# Patient Record
Sex: Female | Born: 1955 | Race: Black or African American | Hispanic: No | Marital: Single | Smoking: Never smoker
Health system: Southern US, Community
[De-identification: ages and names within clinical notes are randomized; demographics above are authoritative.]

## PROBLEM LIST (undated history)

## (undated) DIAGNOSIS — K219 Gastro-esophageal reflux disease without esophagitis: Secondary | ICD-10-CM

## (undated) DIAGNOSIS — K8689 Other specified diseases of pancreas: Secondary | ICD-10-CM

## (undated) DIAGNOSIS — Z789 Other specified health status: Secondary | ICD-10-CM

## (undated) DIAGNOSIS — K769 Liver disease, unspecified: Secondary | ICD-10-CM

## (undated) DIAGNOSIS — K449 Diaphragmatic hernia without obstruction or gangrene: Secondary | ICD-10-CM

---

## 2004-04-22 ENCOUNTER — Other Ambulatory Visit: Admission: RE | Admit: 2004-04-22 | Discharge: 2004-04-22 | Payer: Self-pay | Admitting: Family Medicine

## 2004-04-26 ENCOUNTER — Encounter: Admission: RE | Admit: 2004-04-26 | Discharge: 2004-04-26 | Payer: Self-pay | Admitting: Family Medicine

## 2019-06-30 ENCOUNTER — Ambulatory Visit (HOSPITAL_COMMUNITY)
Admission: EM | Admit: 2019-06-30 | Discharge: 2019-06-30 | Disposition: A | Discharge status: Home / Self Care | Payer: BC Managed Care – PPO | Attending: Family Medicine | Admitting: Family Medicine

## 2019-06-30 ENCOUNTER — Other Ambulatory Visit: Payer: Self-pay

## 2019-06-30 ENCOUNTER — Encounter (HOSPITAL_COMMUNITY): Payer: Self-pay

## 2019-06-30 DIAGNOSIS — R1013 Epigastric pain: Secondary | ICD-10-CM | POA: Insufficient documentation

## 2019-06-30 LAB — COMPREHENSIVE METABOLIC PANEL
ALT: 24 U/L (ref 0–44)
AST: 21 U/L (ref 15–41)
Albumin: 3.7 g/dL (ref 3.5–5.0)
Alkaline Phosphatase: 82 U/L (ref 38–126)
Anion gap: 13 (ref 5–15)
BUN: 9 mg/dL (ref 8–23)
CO2: 27 mmol/L (ref 22–32)
Calcium: 9.7 mg/dL (ref 8.9–10.3)
Chloride: 97 mmol/L — ABNORMAL LOW (ref 98–111)
Creatinine, Ser: 0.92 mg/dL (ref 0.44–1.00)
GFR calc Af Amer: >60 mL/min (ref >60)
GFR calc non Af Amer: >60 mL/min (ref >60)
Glucose, Bld: 152 mg/dL — ABNORMAL HIGH (ref 70–99)
Potassium: 3.4 mmol/L — ABNORMAL LOW (ref 3.5–5.1)
Sodium: 137 mmol/L (ref 135–145)
Total Bilirubin: 0.8 mg/dL (ref 0.3–1.2)
Total Protein: 8.4 g/dL — ABNORMAL HIGH (ref 6.5–8.1)

## 2019-06-30 LAB — CBC
HCT: 44.1 % (ref 36.0–46.0)
Hemoglobin: 14 g/dL (ref 12.0–15.0)
MCH: 23.2 pg — ABNORMAL LOW (ref 26.0–34.0)
MCHC: 31.7 g/dL (ref 30.0–36.0)
MCV: 73 fL — ABNORMAL LOW (ref 80.0–100.0)
Platelets: 320 10*3/uL (ref 150–400)
RBC: 6.04 MIL/uL — ABNORMAL HIGH (ref 3.87–5.11)
RDW: 13.9 % (ref 11.5–15.5)
WBC: 5.6 10*3/uL (ref 4.0–10.5)
nRBC: 0 % (ref 0.0–0.2)

## 2019-06-30 LAB — LIPASE, BLOOD: Lipase: 87 U/L — ABNORMAL HIGH (ref 11–51)

## 2019-06-30 MED ORDER — ALUM & MAG HYDROXIDE-SIMETH 200-200-20 MG/5ML PO SUSP
ORAL | Status: AC
  Filled 2019-06-30: qty 30

## 2019-06-30 MED ORDER — LIDOCAINE VISCOUS HCL 2 % MT SOLN
15.0000 mL | Freq: Once | OROMUCOSAL | Status: AC
  Administered 2019-06-30: 15:00:00 15 mL via ORAL

## 2019-06-30 MED ORDER — LIDOCAINE VISCOUS HCL 2 % MT SOLN
OROMUCOSAL | Status: AC
  Filled 2019-06-30: qty 15

## 2019-06-30 MED ORDER — ALUM & MAG HYDROXIDE-SIMETH 200-200-20 MG/5ML PO SUSP
30.0000 mL | Freq: Once | ORAL | Status: AC
  Administered 2019-06-30: 30 mL via ORAL

## 2019-06-30 NOTE — ED Triage Notes (Signed)
Pt. States she has a burning pain in her back, stomach for about 3 weeks now.

## 2019-06-30 NOTE — ED Provider Notes (Signed)
[employer]    CSN: [id number] Arrival date & time: 06/30/19  1117      History   Chief Complaint Chief Complaint  Patient presents with  . Gastroesophageal Reflux    HPI Rachel Vasquez is a 63 y.o. female.   Patient is an otherwise healthy 63 year old female presents today with approximately 3 weeks of intermittent epigastric discomfort with pain radiation into her upper back area.  Describes it as burning.  She took omeprazole for her symptoms for about 2 weeks without any relief.  She feels as if her symptoms are worsening.  She is also has some associated left-sided chest pain with radiation down the left arm.  Describes it is sharp at times.  Denies any associated shortness of breath.  She does not smoke.  She has not had any alcohol use in months.    ROS per HPI    Gastroesophageal Reflux    History reviewed. No pertinent past medical history.  There are no problems to display for this patient.   History reviewed. No pertinent surgical history.  OB History   No obstetric history on file.      Home Medications    Prior to Admission medications   Not on File    Family History History reviewed. No pertinent family history.  Social History Social History   Tobacco Use  . Smoking status: Never Smoker  . Smokeless tobacco: Never Used  Substance Use Topics  . Alcohol use: Yes  . Drug use: Never     Allergies   Patient has no known allergies.   Review of Systems Review of Systems   Physical Exam Triage Vital Signs ED Triage Vitals  Enc Vitals Group     BP 06/30/19 1315 (!) 137/97     Pulse Rate 06/30/19 1315 99     Resp 06/30/19 1315 19     Temp 06/30/19 1315 98.1 F (36.7 C)     Temp Source 06/30/19 1315 Oral     SpO2 06/30/19 1315 98 %     Weight 06/30/19 1311 248 lb (112.5 kg)     Height --      Head Circumference --      Peak Flow --      Pain Score 06/30/19 1311 5     Pain Loc --      Pain Edu? --      Excl. in [place]?  --    No data found.  Updated Vital Signs BP (!) 137/97 (BP Location: Right Arm)   Pulse 99   Temp 98.1 F (36.7 C) (Oral)   Resp 19   Wt 248 lb (112.5 kg)   SpO2 98%   Visual Acuity Right Eye Distance:   Left Eye Distance:   Bilateral Distance:    Right Eye Near:   Left Eye Near:    Bilateral Near:     Physical Exam Vitals and nursing note reviewed.  Constitutional:      General: She is not in acute distress.    Appearance: Normal appearance. She is ill-appearing. She is not toxic-appearing or diaphoretic.  HENT:     Head: Normocephalic and atraumatic.     Nose: Nose normal.  Pulmonary:     Effort: Pulmonary effort is normal.  Abdominal:     General: Bowel sounds are normal.     Palpations: Abdomen is soft.     Tenderness: There is abdominal tenderness in the epigastric area.  Musculoskeletal:  General: Normal range of motion.  Skin:    General: Skin is warm and dry.  Neurological:     Mental Status: She is alert.  Psychiatric:        Mood and Affect: Mood normal.      UC Treatments / Results  Labs (all labs ordered are listed, but only abnormal results are displayed) Labs Reviewed  CBC - Abnormal; Notable for the following components:      Result Value   RBC 6.04 (*)    MCV 73.0 (*)    MCH 23.2 (*)    All other components within normal limits  COMPREHENSIVE METABOLIC PANEL - Abnormal; Notable for the following components:   Potassium 3.4 (*)    Chloride 97 (*)    Glucose, Bld 152 (*)    Total Protein 8.4 (*)    All other components within normal limits  LIPASE, BLOOD - Abnormal; Notable for the following components:   Lipase 87 (*)    All other components within normal limits    EKG   Radiology No results found.  Procedures Procedures (including critical care time)  Medications Ordered in UC Medications  alum & mag hydroxide-simeth (MAALOX/MYLANTA) 200-200-20 MG/5ML suspension 30 mL (30 mLs Oral Given 06/30/19 1451)    And    lidocaine (XYLOCAINE) 2 % viscous mouth solution 15 mL (15 mLs Oral Given 06/30/19 1452)    Initial Impression / Assessment and Plan / UC Course  I have reviewed the triage vital signs and the nursing notes.  Pertinent labs & imaging results that were available during my care of the patient were reviewed by me and considered in my medical decision making (see chart for details).  Clinical Course as of Jul 01 1639  Mon Jun 30, 2019  1527 Lipase(!): 87 [TB]    Clinical Course User Index [TB] Orvan July, NP    63 year old female here with epigastric tenderness, nausea.  Symptoms been constant, waxing waning over the past 3 weeks. Patient reporting mild relief from GI cocktail but still having discomfort. EKG with mild tachycardia.  Upon reassessment patient's heart rate normalized.  The elevation was after receiving the GI cocktail. She is having no chest pain or shortness of breath. No concerning signs for ACS today. Based on symptoms I believe this may be some sort of gallbladder or pancreas issue.  Could be ulcer but feel this would resolve with the 2 weeks of Prilosec that she took.  She does not drink regularly but lipase was elevated at 87.  Not likely pancreatitis. Referral put in for GI for follow-up Recommended bland diet for now and she could keep trying Prilosec to see if this helps. If symptoms worsen she will need to go to the ER.  Patient understanding and agree. Final Clinical Impressions(s) / UC Diagnoses   Final diagnoses:  Abdominal pain, epigastric     Discharge Instructions     I am giving you a referral to GI.  They should be calling you to set up an appointment. Continue a bland diet for now. You can continue with the Prilosec to see if this helps. This could be some sort of gallbladder issue. If your symptoms worsen before you are able to get into see the specialist or you start having chest pain you will need to go to the ER. Contact put on your  instructions for primary care.  Please call them  for follow-up    ED Prescriptions    None  PDMP not reviewed this encounter.   Loura Halt A, NP 07/01/19 1640

## 2019-06-30 NOTE — Discharge Instructions (Addendum)
I am giving you a referral to GI.  They should be calling you to set up an appointment. Continue a bland diet for now. You can continue with the Prilosec to see if this helps. This could be some sort of gallbladder issue. If your symptoms worsen before you are able to get into see the specialist or you start having chest pain you will need to go to the ER. Contact put on your instructions for primary care.  Please call them  for follow-up

## 2019-07-02 ENCOUNTER — Inpatient Hospital Stay (HOSPITAL_COMMUNITY)
Admission: EM | Admit: 2019-07-02 | Discharge: 2019-07-17 | DRG: 423 | Disposition: A | Discharge status: Home / Self Care | Payer: BC Managed Care – PPO | Attending: Internal Medicine | Admitting: Internal Medicine

## 2019-07-02 ENCOUNTER — Encounter (HOSPITAL_COMMUNITY): Payer: Self-pay | Admitting: Emergency Medicine

## 2019-07-02 ENCOUNTER — Emergency Department (HOSPITAL_COMMUNITY): Payer: BC Managed Care – PPO

## 2019-07-02 ENCOUNTER — Other Ambulatory Visit: Payer: Self-pay

## 2019-07-02 DIAGNOSIS — E876 Hypokalemia: Secondary | ICD-10-CM

## 2019-07-02 DIAGNOSIS — R112 Nausea with vomiting, unspecified: Secondary | ICD-10-CM | POA: Diagnosis not present

## 2019-07-02 DIAGNOSIS — C250 Malignant neoplasm of head of pancreas: Secondary | ICD-10-CM | POA: Diagnosis not present

## 2019-07-02 DIAGNOSIS — K859 Acute pancreatitis without necrosis or infection, unspecified: Secondary | ICD-10-CM

## 2019-07-02 DIAGNOSIS — E860 Dehydration: Secondary | ICD-10-CM | POA: Diagnosis present

## 2019-07-02 DIAGNOSIS — E669 Obesity, unspecified: Secondary | ICD-10-CM | POA: Diagnosis present

## 2019-07-02 DIAGNOSIS — I10 Essential (primary) hypertension: Secondary | ICD-10-CM | POA: Diagnosis present

## 2019-07-02 DIAGNOSIS — R824 Acetonuria: Secondary | ICD-10-CM | POA: Diagnosis present

## 2019-07-02 DIAGNOSIS — D630 Anemia in neoplastic disease: Secondary | ICD-10-CM | POA: Diagnosis present

## 2019-07-02 DIAGNOSIS — K5900 Constipation, unspecified: Secondary | ICD-10-CM | POA: Diagnosis not present

## 2019-07-02 DIAGNOSIS — Z801 Family history of malignant neoplasm of trachea, bronchus and lung: Secondary | ICD-10-CM

## 2019-07-02 DIAGNOSIS — K8689 Other specified diseases of pancreas: Secondary | ICD-10-CM | POA: Diagnosis present

## 2019-07-02 DIAGNOSIS — R109 Unspecified abdominal pain: Secondary | ICD-10-CM

## 2019-07-02 DIAGNOSIS — Z20822 Contact with and (suspected) exposure to covid-19: Secondary | ICD-10-CM | POA: Diagnosis present

## 2019-07-02 DIAGNOSIS — K219 Gastro-esophageal reflux disease without esophagitis: Secondary | ICD-10-CM | POA: Diagnosis present

## 2019-07-02 DIAGNOSIS — K449 Diaphragmatic hernia without obstruction or gangrene: Secondary | ICD-10-CM

## 2019-07-02 DIAGNOSIS — D509 Iron deficiency anemia, unspecified: Secondary | ICD-10-CM

## 2019-07-02 DIAGNOSIS — D259 Leiomyoma of uterus, unspecified: Secondary | ICD-10-CM | POA: Diagnosis present

## 2019-07-02 DIAGNOSIS — C259 Malignant neoplasm of pancreas, unspecified: Secondary | ICD-10-CM

## 2019-07-02 DIAGNOSIS — K8681 Exocrine pancreatic insufficiency: Secondary | ICD-10-CM | POA: Diagnosis present

## 2019-07-02 DIAGNOSIS — K567 Ileus, unspecified: Secondary | ICD-10-CM

## 2019-07-02 DIAGNOSIS — R1013 Epigastric pain: Secondary | ICD-10-CM | POA: Diagnosis present

## 2019-07-02 DIAGNOSIS — I472 Ventricular tachycardia: Secondary | ICD-10-CM | POA: Diagnosis not present

## 2019-07-02 DIAGNOSIS — Z6841 Body Mass Index (BMI) 40.0 and over, adult: Secondary | ICD-10-CM

## 2019-07-02 DIAGNOSIS — Z800 Family history of malignant neoplasm of digestive organs: Secondary | ICD-10-CM

## 2019-07-02 DIAGNOSIS — K769 Liver disease, unspecified: Secondary | ICD-10-CM | POA: Diagnosis present

## 2019-07-02 DIAGNOSIS — R160 Hepatomegaly, not elsewhere classified: Secondary | ICD-10-CM

## 2019-07-02 DIAGNOSIS — C787 Secondary malignant neoplasm of liver and intrahepatic bile duct: Secondary | ICD-10-CM | POA: Diagnosis present

## 2019-07-02 DIAGNOSIS — C258 Malignant neoplasm of overlapping sites of pancreas: Secondary | ICD-10-CM

## 2019-07-02 DIAGNOSIS — R627 Adult failure to thrive: Secondary | ICD-10-CM | POA: Diagnosis present

## 2019-07-02 HISTORY — DX: Diaphragmatic hernia without obstruction or gangrene: K44.9

## 2019-07-02 HISTORY — DX: Other specified diseases of pancreas: K86.89

## 2019-07-02 HISTORY — DX: Liver disease, unspecified: K76.9

## 2019-07-02 HISTORY — DX: Other specified health status: Z78.9

## 2019-07-02 HISTORY — DX: Gastro-esophageal reflux disease without esophagitis: K21.9

## 2019-07-02 LAB — CBC
HCT: 42.5 % (ref 36.0–46.0)
Hemoglobin: 13.1 g/dL (ref 12.0–15.0)
MCH: 22.9 pg — ABNORMAL LOW (ref 26.0–34.0)
MCHC: 30.8 g/dL (ref 30.0–36.0)
MCV: 74.3 fL — ABNORMAL LOW (ref 80.0–100.0)
Platelets: 297 10*3/uL (ref 150–400)
RBC: 5.72 MIL/uL — ABNORMAL HIGH (ref 3.87–5.11)
RDW: 14.1 % (ref 11.5–15.5)
WBC: 5.6 10*3/uL (ref 4.0–10.5)
nRBC: 0 % (ref 0.0–0.2)

## 2019-07-02 LAB — LIPASE, BLOOD: Lipase: 92 U/L — ABNORMAL HIGH (ref 11–51)

## 2019-07-02 LAB — URINALYSIS, ROUTINE W REFLEX MICROSCOPIC
Glucose, UA: NEGATIVE mg/dL
Ketones, ur: 20 mg/dL — AB
Leukocytes,Ua: NEGATIVE
Nitrite: NEGATIVE
Protein, ur: 100 mg/dL — AB
Specific Gravity, Urine: 1.031 — ABNORMAL HIGH (ref 1.005–1.030)
pH: 5 (ref 5.0–8.0)

## 2019-07-02 LAB — COMPREHENSIVE METABOLIC PANEL
ALT: 24 U/L (ref 0–44)
AST: 21 U/L (ref 15–41)
Albumin: 3.5 g/dL (ref 3.5–5.0)
Alkaline Phosphatase: 85 U/L (ref 38–126)
Anion gap: 14 (ref 5–15)
BUN: 12 mg/dL (ref 8–23)
CO2: 27 mmol/L (ref 22–32)
Calcium: 9.4 mg/dL (ref 8.9–10.3)
Chloride: 97 mmol/L — ABNORMAL LOW (ref 98–111)
Creatinine, Ser: 0.9 mg/dL (ref 0.44–1.00)
GFR calc Af Amer: >60 mL/min (ref >60)
GFR calc non Af Amer: >60 mL/min (ref >60)
Glucose, Bld: 138 mg/dL — ABNORMAL HIGH (ref 70–99)
Potassium: 3.5 mmol/L (ref 3.5–5.1)
Sodium: 138 mmol/L (ref 135–145)
Total Bilirubin: 0.7 mg/dL (ref 0.3–1.2)
Total Protein: 7.8 g/dL (ref 6.5–8.1)

## 2019-07-02 LAB — TROPONIN I (HIGH SENSITIVITY): Troponin I (High Sensitivity): 6 ng/L (ref <18)

## 2019-07-02 MED ORDER — IOHEXOL 300 MG/ML  SOLN
100.0000 mL | Freq: Once | INTRAMUSCULAR | Status: AC | PRN
  Administered 2019-07-02: 100 mL via INTRAVENOUS

## 2019-07-02 MED ORDER — ONDANSETRON HCL 4 MG/2ML IJ SOLN
4.0000 mg | Freq: Once | INTRAMUSCULAR | Status: AC
  Administered 2019-07-02: 4 mg via INTRAVENOUS
  Filled 2019-07-02: qty 2

## 2019-07-02 MED ORDER — SODIUM CHLORIDE 0.9 % IV BOLUS
1000.0000 mL | Freq: Once | INTRAVENOUS | Status: AC
  Administered 2019-07-02: 1000 mL via INTRAVENOUS

## 2019-07-02 MED ORDER — ONDANSETRON 4 MG PO TBDP
4.0000 mg | ORAL_TABLET | Freq: Once | ORAL | Status: AC | PRN
  Administered 2019-07-02: 15:00:00 4 mg via ORAL
  Filled 2019-07-02: qty 1

## 2019-07-02 MED ORDER — MORPHINE SULFATE (PF) 4 MG/ML IV SOLN
4.0000 mg | Freq: Once | INTRAVENOUS | Status: DC
  Filled 2019-07-02: qty 1

## 2019-07-02 MED ORDER — MORPHINE SULFATE (PF) 2 MG/ML IV SOLN
2.0000 mg | Freq: Once | INTRAVENOUS | Status: AC
Start: 2019-07-02 — End: 2019-07-02
  Administered 2019-07-02: 2 mg via INTRAVENOUS
  Filled 2019-07-02: qty 1

## 2019-07-02 NOTE — ED Triage Notes (Signed)
Patient states she went to GI specialists in [place] today and was given prescriptions but patient reports unable to keep medicine down. GI recommends ultrasound and EGD but patient states she cannot wait 2 more weeks for them to schedule the procedure.

## 2019-07-02 NOTE — H&P (Signed)
History and Physical    Rachel Vasquez [id number] DOB: 1955-08-01 DOA: 07/02/2019  PCP: Patient, [name] Per   Patient coming from: Home   Chief Complaint: Nausea, vomiting, loss of appetite, fatigue, epigastric discomfort  HPI: Rachel Vasquez is a 63 y.o. female who denies any significant past medical history now presents for evaluation of progressive nausea, vomiting, loss of appetite, fatigue, and epigastric discomfort.  The patient reports that the symptoms developed insidiously little over 3 months ago, have now progressed to the point where she has now been able to tolerate much food or drink at all due to nausea and vomiting, and has developed some epigastric pain as well.  There has not been any fevers, chills, or cough associated with this.  She was evaluated at an urgent care recently, has been taking omeprazole without any relief, and was then seen by gastroenterology today who provided her with Zofran and planned for upcoming abdominal ultrasound and EGD.  Patient has been unable to tolerate the oral Zofran due to ongoing nausea and vomiting, does not feel that she can control her symptoms at home, and comes in for evaluation.  ED Course: Upon arrival to the ED, patient is found to be afebrile, saturating well on room air, and with stable blood pressure.  EKG features a sinus rhythm.  Chemistry panel and CBC are largely unremarkable, troponin is normal, and lipase elevated to 92.  Urinalysis notable for ketonuria and elevated specific gravity.  CT the abdomen and pelvis is concerning for generalized pancreatic inflammation with ill-defined hypodense suspicious mass at the head of the pancreas and multiple ill-defined hypodense liver lesions.  Patient was given a liter of normal saline, morphine, and Zofran in the emergency department.  COVID-19 screening test is in process.  Gastroenterology was contacted by the ED physician and indicated that they would be available for consultation should  the patient require admission for symptom control.  Review of Systems:  All other systems reviewed and apart from HPI, are negative.  Past Medical History:  Diagnosis Date  . Medical history non-contributory     History reviewed. No pertinent surgical history.   reports that she has never smoked. She has never used smokeless tobacco. She reports current alcohol use. She reports that she does not use drugs.  No Known Allergies  Family History  Problem Relation Age of Onset  . Pancreatic cancer Father      Prior to Admission medications   Not on File    Physical Exam: Vitals:   07/02/19 1437 07/02/19 1835 07/02/19 1945 07/02/19 2240  BP:  (!) 129/99 (!) 149/77 (!) 145/87  Pulse:  95 93 90  Resp:  18 18 17   Temp:      TempSrc:      SpO2:  98% 98% 98%  Weight: 111.6 kg     Height: 5\' 7"  (1.702 m)       Constitutional: NAD, calm  Eyes: PERTLA, lids and conjunctivae normal ENMT: Mucous membranes are moist. Posterior pharynx clear of any exudate or lesions.   Neck: normal, supple, no masses, no thyromegaly Respiratory: no wheezing, no crackles. Normal respiratory effort. No accessory muscle use.  Cardiovascular: S1 & S2 heard, regular rate and rhythm. No extremity edema.   Abdomen: No distension, soft, mild epigastric tenderness. Bowel sounds active.  Musculoskeletal: no clubbing / cyanosis. No joint deformity upper and lower extremities.    Skin: no significant rashes, lesions, ulcers. Warm, dry, well-perfused. Neurologic: No facial asymmetry. Sensation intact. Moving  all extremities.  Psychiatric: Alert, answering questions appropriately. Calm, cooperative.     Labs on Admission: I have personally reviewed following labs and imaging studies  CBC: Recent Labs  Lab 06/30/19 1432 07/02/19 1448  WBC 5.6 5.6  HGB 14.0 13.1  HCT 44.1 42.5  MCV 73.0* 74.3*  PLT 320 [id number]   Basic Metabolic Panel: Recent Labs  Lab 06/30/19 1432 07/02/19 1448  NA 137 138  K 3.4*  3.5  CL 97* 97*  CO2 27 27  GLUCOSE 152* 138*  BUN 9 12  CREATININE 0.92 0.90  CALCIUM 9.7 9.4   GFR: Estimated Creatinine Clearance: 81.3 mL/min (by C-G formula based on SCr of 0.9 mg/dL). Liver Function Tests: Recent Labs  Lab 06/30/19 1432 07/02/19 1448  AST 21 21  ALT 24 24  ALKPHOS 82 85  BILITOT 0.8 0.7  PROT 8.4* 7.8  ALBUMIN 3.7 3.5   Recent Labs  Lab 06/30/19 1432 07/02/19 1448  LIPASE 87* 92*   No results for input(s): AMMONIA in the last 168 hours. Coagulation Profile: No results for input(s): INR, PROTIME in the last 168 hours. Cardiac Enzymes: No results for input(s): CKTOTAL, CKMB, CKMBINDEX, TROPONINI in the last 168 hours. BNP (last 3 results) No results for input(s): PROBNP in the last 8760 hours. HbA1C: No results for input(s): HGBA1C in the last 72 hours. CBG: No results for input(s): GLUCAP in the last 168 hours. Lipid Profile: No results for input(s): CHOL, HDL, LDLCALC, TRIG, CHOLHDL, LDLDIRECT in the last 72 hours. Thyroid Function Tests: No results for input(s): TSH, T4TOTAL, FREET4, T3FREE, THYROIDAB in the last 72 hours. Anemia Panel: No results for input(s): VITAMINB12, FOLATE, FERRITIN, TIBC, IRON, RETICCTPCT in the last 72 hours. Urine analysis:    Component Value Date/Time   COLORURINE AMBER (A) 07/02/2019 2038   APPEARANCEUR CLOUDY (A) 07/02/2019 2038   LABSPEC 1.031 (H) 07/02/2019 2038   PHURINE 5.0 07/02/2019 2038   GLUCOSEU NEGATIVE 07/02/2019 2038   HGBUR SMALL (A) 07/02/2019 2038   BILIRUBINUR SMALL (A) 07/02/2019 2038   KETONESUR 20 (A) 07/02/2019 2038   PROTEINUR 100 (A) 07/02/2019 2038   NITRITE NEGATIVE 07/02/2019 2038   LEUKOCYTESUR NEGATIVE 07/02/2019 2038   Sepsis Labs: @LABRCNTIP (procalcitonin:4,lacticidven:4) )No results found for this or any previous visit (from the past 240 hour(s)).   Radiological Exams on Admission: CT Abdomen Pelvis W Contrast  Result Date: 07/02/2019 CLINICAL DATA:  Abdominal pain  with nausea and vomiting EXAM: CT ABDOMEN AND PELVIS WITH CONTRAST TECHNIQUE: Multidetector CT imaging of the abdomen and pelvis was performed using the standard protocol following bolus administration of intravenous contrast. CONTRAST:  153mL OMNIPAQUE IOHEXOL 300 MG/ML  SOLN COMPARISON:  CT 04/26/2004 FINDINGS: Lower chest: Lung bases demonstrate subsegmental atelectasis at the lingula, right middle lobe and inferior right upper lobe. No acute consolidation or pleural effusion. The heart size is within normal limits. Irregular moderate hiatal hernia with wall thickening. This is increased compared to prior. Hepatobiliary: Scattered hypodense liver lesions, some of which were present on comparison study. Multiple new ill-defined hypodense liver masses concerning for metastatic disease. Largest suspicious lesion is seen in the right hepatic dome and measures 2.5 cm. Multiple calcified gallstones. No biliary dilatation. Pancreas: Generalized inflammatory changes about the pancreas. Development of mild ductal dilatation. Ill-defined hypodense mass at the head of the pancreas measuring approximately 3.4 cm transverse by 2.2 cm AP by 3 cm craniocaudad. Spleen: Normal in size without focal abnormality. Adrenals/Urinary Tract: Adrenal glands are unremarkable. Kidneys are normal, without  renal calculi, focal lesion, or hydronephrosis. Bladder is unremarkable. Stomach/Bowel: No dilated small bowel. Negative for colon wall thickening. Negative appendix. Vascular/Lymphatic: Nonaneurysmal aorta. Hazy appearance of mesenteric root with subcentimeter nodes. Multiple small nodes or punctate nodules in the right lower quadrant/right gutter. Hypoenhancement of the distal splenic vein and superior mesenteric vein. Reproductive: Markedly enlarged uterus with multiple masses, increased compared to prior. Other: Negative for free air or significant ascites. Anterior abdominal wall laxity. Small supraumbilical fat containing ventral  hernia Musculoskeletal: No acute or suspicious osseous abnormality. IMPRESSION: 1. Generalized pancreatic inflammation. There is an ill-defined hypodense suspicious mass at the head of the pancreas measuring 3.4 x 2.2 x 3 cm. 2. Multiple ill-defined hypodense liver masses, consistent with metastatic disease. 3. Hypoenhancement of the splenic vein and superior mesenteric veins near their confluence, potentially occluded. 4. Multiple punctate nodules or small nodes in the right lower quadrant/gutter, cannot exclude metastatic disease 5. Moderate irregular hiatal hernia with some wall thickening, could be correlated with endoscopy 6. Bulky enlargement of the uterus with multiple large masses, increased compared to prior. Electronically Signed   By: Donavan Foil M.D.   On: 07/02/2019 22:17    EKG: Independently reviewed. Sinus rhythm.   Assessment/Plan   1. Pancreatic mass  - Presents with ~3 months of progressive anorexia, N/V, and now epigastric discomfort, and is found on CT to have an ill-defined mass at head of pancreas as well as hypodense liver lesions  - She has been unable to eat or drink much of anything recently due to N/V and appears dehydrated - With apparent liver lesions on CT, next step in diagnosis may be FNA  - Continue IVF hydration, antiemetics, pain-control     DVT prophylaxis: SCD's  Code Status: Full  Family Communication: Discussed with patient  Consults called: None Admission status: Observation     Vianne Bulls, MD [facility] Pager [phone]  If 7PM-7AM, please contact night-coverage [email or web address] Password [email or web address]  07/02/2019, 11:56 PM

## 2019-07-02 NOTE — ED Provider Notes (Signed)
[facility] EMERGENCY DEPARTMENT Provider Note   CSN: [id number] Arrival date & time: 07/02/19  1420     History Chief Complaint  Patient presents with  . Emesis    Rachel Vasquez is a 63 y.o. female.  The history is provided by the patient and medical records. No language interpreter was used.  Emesis  Rachel Vasquez is a 63 y.o. female who presents to the Emergency Department complaining of abdominal pain. She presents to the ED for evaluation of abdominal pain since September.  Overall sxs have progressively worsened.  She has generalized back pain, upper abdominal pain and bloating.  She feels weak and has poor appetite.  For last two weeks she has only had watermelon and pears.  She gets cold but has no fevers.  She is fatigued. Symptoms have gradually progressed over the last several months. She feels that she is in a point that she cannot handle her symptoms at home. She is taken medications for her symptoms but has ongoing pain and recurrent vomiting. She saw urgent care two days ago and had labs performed and was referred to G.I. She saw G.I. earlier today was recommended for ultrasound and EGD for further workup. She states she cannot wait two weeks for this additional workup.    History reviewed. No pertinent past medical history.  Patient Active Problem List   Diagnosis Date Noted  . 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 Nausea & vomiting 07/02/2019    History reviewed. No pertinent surgical history.   OB History   No obstetric history on file.     History reviewed. No pertinent family history.  Social History   Tobacco Use  . Smoking status: Never Smoker  . Smokeless tobacco: Never Used  Substance Use Topics  . Alcohol use: Yes  . Drug use: Never    Home Medications Prior to Admission medications   Not on File    Allergies    Patient has no known allergies.  Review of Systems   Review of Systems  Gastrointestinal: Positive for  vomiting.  All other systems reviewed and are negative.   Physical Exam Updated Vital Signs BP (!) 145/87   Pulse 90   Temp 98.2 F (36.8 C) (Oral)   Resp 17   Ht 5\' 7"  (1.702 m)   Wt 111.6 kg   SpO2 98%   BMI 38.53 kg/m   Physical Exam Vitals and nursing note reviewed.  Constitutional:      Appearance: She is well-developed.  HENT:     Head: Normocephalic and atraumatic.  Cardiovascular:     Rate and Rhythm: Normal rate and regular rhythm.  Pulmonary:     Effort: Pulmonary effort is normal. No respiratory distress.  Abdominal:     Palpations: Abdomen is soft.     Tenderness: There is no guarding or rebound.     Comments: Mild epigastric tenderness  Musculoskeletal:        General: No swelling or tenderness.  Skin:    General: Skin is warm and dry.  Neurological:     Mental Status: She is alert and oriented to person, place, and time.  Psychiatric:        Behavior: Behavior normal.     ED Results / Procedures / Treatments   Labs (all labs ordered are listed, but only abnormal results are displayed) Labs Reviewed  LIPASE, BLOOD - Abnormal; Notable for the following components:      Result Value   Lipase 92 (*)  All other components within normal limits  COMPREHENSIVE METABOLIC PANEL - Abnormal; Notable for the following components:   Chloride 97 (*)    Glucose, Bld 138 (*)    All other components within normal limits  CBC - Abnormal; Notable for the following components:   RBC 5.72 (*)    MCV 74.3 (*)    MCH 22.9 (*)    All other components within normal limits  URINALYSIS, ROUTINE W REFLEX MICROSCOPIC - Abnormal; Notable for the following components:   Color, Urine AMBER (*)    APPearance CLOUDY (*)    Specific Gravity, Urine 1.031 (*)    Hgb urine dipstick SMALL (*)    Bilirubin Urine SMALL (*)    Ketones, ur 20 (*)    Protein, ur 100 (*)    Bacteria, UA RARE (*)    All other components within normal limits  SARS CORONAVIRUS 2 (TAT 6-24 HRS)    TROPONIN I (HIGH SENSITIVITY)  TROPONIN I (HIGH SENSITIVITY)    EKG EKG Interpretation  Date/Time:  Wednesday July 02 2019 21:07:31 EST Ventricular Rate:  87 PR Interval:    QRS Duration: 97 QT Interval:  397 QTC Calculation: 478 R Axis:   32 Text Interpretation: Sinus rhythm Low voltage, precordial leads Borderline T abnormalities, inferior leads no prior available for comparison Confirmed by Quintella Reichert [phone]) on 07/02/2019 9:28:57 PM   Radiology CT Abdomen Pelvis W Contrast  Result Date: 07/02/2019 CLINICAL DATA:  Abdominal pain with nausea and vomiting EXAM: CT ABDOMEN AND PELVIS WITH CONTRAST TECHNIQUE: Multidetector CT imaging of the abdomen and pelvis was performed using the standard protocol following bolus administration of intravenous contrast. CONTRAST:  130mL OMNIPAQUE IOHEXOL 300 MG/ML  SOLN COMPARISON:  CT 04/26/2004 FINDINGS: Lower chest: Lung bases demonstrate subsegmental atelectasis at the lingula, right middle lobe and inferior right upper lobe. No acute consolidation or pleural effusion. The heart size is within normal limits. Irregular moderate hiatal hernia with wall thickening. This is increased compared to prior. Hepatobiliary: Scattered hypodense liver lesions, some of which were present on comparison study. Multiple new ill-defined hypodense liver masses concerning for metastatic disease. Largest suspicious lesion is seen in the right hepatic dome and measures 2.5 cm. Multiple calcified gallstones. No biliary dilatation. Pancreas: Generalized inflammatory changes about the pancreas. Development of mild ductal dilatation. Ill-defined hypodense mass at the head of the pancreas measuring approximately 3.4 cm transverse by 2.2 cm AP by 3 cm craniocaudad. Spleen: Normal in size without focal abnormality. Adrenals/Urinary Tract: Adrenal glands are unremarkable. Kidneys are normal, without renal calculi, focal lesion, or hydronephrosis. Bladder is unremarkable.  Stomach/Bowel: No dilated small bowel. Negative for colon wall thickening. Negative appendix. Vascular/Lymphatic: Nonaneurysmal aorta. Hazy appearance of mesenteric root with subcentimeter nodes. Multiple small nodes or punctate nodules in the right lower quadrant/right gutter. Hypoenhancement of the distal splenic vein and superior mesenteric vein. Reproductive: Markedly enlarged uterus with multiple masses, increased compared to prior. Other: Negative for free air or significant ascites. Anterior abdominal wall laxity. Small supraumbilical fat containing ventral hernia Musculoskeletal: No acute or suspicious osseous abnormality. IMPRESSION: 1. Generalized pancreatic inflammation. There is an ill-defined hypodense suspicious mass at the head of the pancreas measuring 3.4 x 2.2 x 3 cm. 2. Multiple ill-defined hypodense liver masses, consistent with metastatic disease. 3. Hypoenhancement of the splenic vein and superior mesenteric veins near their confluence, potentially occluded. 4. Multiple punctate nodules or small nodes in the right lower quadrant/gutter, cannot exclude metastatic disease 5. Moderate irregular hiatal hernia with  some wall thickening, could be correlated with endoscopy 6. Bulky enlargement of the uterus with multiple large masses, increased compared to prior. Electronically Signed   By: Donavan Foil M.D.   On: 07/02/2019 22:17    Procedures Procedures (including critical care time)  Medications Ordered in ED Medications  ondansetron (ZOFRAN-ODT) disintegrating tablet 4 mg (4 mg Oral Given 07/02/19 1441)  iohexol (OMNIPAQUE) 300 MG/ML solution 100 mL (100 mLs Intravenous Contrast Given 07/02/19 2135)  sodium chloride 0.9 % bolus 1,000 mL (1,000 mLs Intravenous New Bag/Given 07/02/19 2311)  morphine 2 MG/ML injection 2 mg (2 mg Intravenous Given 07/02/19 2332)  ondansetron (ZOFRAN) injection 4 mg (4 mg Intravenous Given 07/02/19 2332)    ED Course  I have reviewed the triage vital  signs and the nursing notes.  Pertinent labs & imaging results that were available during my care of the patient were reviewed by me and considered in my medical decision making (see chart for details).    MDM Rules/Calculators/A&P                     Patient here for evaluation of abdominal pain and vomiting that is progressed over the last several months. She is non-toxic appearing on evaluation. CT scan was obtained, which is consistent with pancreatic mass and possible metastatic disease. Discussed with patient findings of studies. Given her poorly controlled symptoms at home, hospitalist consulted for admission. Will treat with IV fluids, pain control pending further workup.  Final Clinical Impression(s) / ED Diagnoses Final diagnoses:  Pancreatic mass    Rx / DC Orders ED Discharge Orders    None       Quintella Reichert, MD 07/02/19 2349

## 2019-07-02 NOTE — ED Notes (Signed)
Pt stated she cannot provide urine sample right now.

## 2019-07-03 ENCOUNTER — Encounter (HOSPITAL_COMMUNITY): Payer: Self-pay | Admitting: Family Medicine

## 2019-07-03 DIAGNOSIS — Z20822 Contact with and (suspected) exposure to covid-19: Secondary | ICD-10-CM | POA: Diagnosis present

## 2019-07-03 DIAGNOSIS — C250 Malignant neoplasm of head of pancreas: Secondary | ICD-10-CM | POA: Diagnosis present

## 2019-07-03 DIAGNOSIS — K769 Liver disease, unspecified: Secondary | ICD-10-CM | POA: Diagnosis present

## 2019-07-03 DIAGNOSIS — R112 Nausea with vomiting, unspecified: Secondary | ICD-10-CM | POA: Diagnosis not present

## 2019-07-03 DIAGNOSIS — D630 Anemia in neoplastic disease: Secondary | ICD-10-CM | POA: Diagnosis present

## 2019-07-03 DIAGNOSIS — K219 Gastro-esophageal reflux disease without esophagitis: Secondary | ICD-10-CM | POA: Diagnosis present

## 2019-07-03 DIAGNOSIS — R160 Hepatomegaly, not elsewhere classified: Secondary | ICD-10-CM | POA: Diagnosis not present

## 2019-07-03 DIAGNOSIS — C787 Secondary malignant neoplasm of liver and intrahepatic bile duct: Secondary | ICD-10-CM | POA: Diagnosis present

## 2019-07-03 DIAGNOSIS — Z801 Family history of malignant neoplasm of trachea, bronchus and lung: Secondary | ICD-10-CM | POA: Diagnosis not present

## 2019-07-03 DIAGNOSIS — K859 Acute pancreatitis without necrosis or infection, unspecified: Secondary | ICD-10-CM | POA: Diagnosis present

## 2019-07-03 DIAGNOSIS — E876 Hypokalemia: Secondary | ICD-10-CM | POA: Diagnosis present

## 2019-07-03 DIAGNOSIS — C259 Malignant neoplasm of pancreas, unspecified: Secondary | ICD-10-CM | POA: Diagnosis not present

## 2019-07-03 DIAGNOSIS — R1084 Generalized abdominal pain: Secondary | ICD-10-CM | POA: Diagnosis not present

## 2019-07-03 DIAGNOSIS — I10 Essential (primary) hypertension: Secondary | ICD-10-CM | POA: Diagnosis present

## 2019-07-03 DIAGNOSIS — R1013 Epigastric pain: Secondary | ICD-10-CM | POA: Diagnosis not present

## 2019-07-03 DIAGNOSIS — D509 Iron deficiency anemia, unspecified: Secondary | ICD-10-CM | POA: Diagnosis present

## 2019-07-03 DIAGNOSIS — K8689 Other specified diseases of pancreas: Secondary | ICD-10-CM | POA: Diagnosis present

## 2019-07-03 DIAGNOSIS — E669 Obesity, unspecified: Secondary | ICD-10-CM | POA: Diagnosis present

## 2019-07-03 DIAGNOSIS — K8681 Exocrine pancreatic insufficiency: Secondary | ICD-10-CM | POA: Diagnosis present

## 2019-07-03 DIAGNOSIS — R627 Adult failure to thrive: Secondary | ICD-10-CM | POA: Diagnosis present

## 2019-07-03 DIAGNOSIS — D259 Leiomyoma of uterus, unspecified: Secondary | ICD-10-CM | POA: Diagnosis present

## 2019-07-03 DIAGNOSIS — K449 Diaphragmatic hernia without obstruction or gangrene: Secondary | ICD-10-CM

## 2019-07-03 DIAGNOSIS — I472 Ventricular tachycardia: Secondary | ICD-10-CM | POA: Diagnosis not present

## 2019-07-03 DIAGNOSIS — Z800 Family history of malignant neoplasm of digestive organs: Secondary | ICD-10-CM | POA: Diagnosis not present

## 2019-07-03 DIAGNOSIS — K5900 Constipation, unspecified: Secondary | ICD-10-CM | POA: Diagnosis not present

## 2019-07-03 DIAGNOSIS — R824 Acetonuria: Secondary | ICD-10-CM | POA: Diagnosis present

## 2019-07-03 DIAGNOSIS — Z6841 Body Mass Index (BMI) 40.0 and over, adult: Secondary | ICD-10-CM | POA: Diagnosis not present

## 2019-07-03 DIAGNOSIS — E860 Dehydration: Secondary | ICD-10-CM | POA: Diagnosis present

## 2019-07-03 LAB — CBC
HCT: 38.4 % (ref 36.0–46.0)
Hemoglobin: 11.9 g/dL — ABNORMAL LOW (ref 12.0–15.0)
MCH: 22.9 pg — ABNORMAL LOW (ref 26.0–34.0)
MCHC: 31 g/dL (ref 30.0–36.0)
MCV: 73.8 fL — ABNORMAL LOW (ref 80.0–100.0)
Platelets: 228 10*3/uL (ref 150–400)
RBC: 5.2 MIL/uL — ABNORMAL HIGH (ref 3.87–5.11)
RDW: 14.3 % (ref 11.5–15.5)
WBC: 5.5 10*3/uL (ref 4.0–10.5)
nRBC: 0 % (ref 0.0–0.2)

## 2019-07-03 LAB — COMPREHENSIVE METABOLIC PANEL
ALT: 24 U/L (ref 0–44)
AST: 22 U/L (ref 15–41)
Albumin: 3 g/dL — ABNORMAL LOW (ref 3.5–5.0)
Alkaline Phosphatase: 73 U/L (ref 38–126)
Anion gap: 11 (ref 5–15)
BUN: 12 mg/dL (ref 8–23)
CO2: 27 mmol/L (ref 22–32)
Calcium: 8.9 mg/dL (ref 8.9–10.3)
Chloride: 100 mmol/L (ref 98–111)
Creatinine, Ser: 0.87 mg/dL (ref 0.44–1.00)
GFR calc Af Amer: >60 mL/min (ref >60)
GFR calc non Af Amer: >60 mL/min (ref >60)
Glucose, Bld: 122 mg/dL — ABNORMAL HIGH (ref 70–99)
Potassium: 3.6 mmol/L (ref 3.5–5.1)
Sodium: 138 mmol/L (ref 135–145)
Total Bilirubin: 1 mg/dL (ref 0.3–1.2)
Total Protein: 6.9 g/dL (ref 6.5–8.1)

## 2019-07-03 LAB — TROPONIN I (HIGH SENSITIVITY): Troponin I (High Sensitivity): 8 ng/L (ref <18)

## 2019-07-03 LAB — HIV ANTIBODY (ROUTINE TESTING W REFLEX): HIV Screen 4th Generation wRfx: NONREACTIVE

## 2019-07-03 LAB — SARS CORONAVIRUS 2 (TAT 6-24 HRS): SARS Coronavirus 2: NEGATIVE

## 2019-07-03 LAB — HEMOGLOBIN A1C
Hgb A1c MFr Bld: 6.5 % — ABNORMAL HIGH (ref 4.8–5.6)
Mean Plasma Glucose: 139.85 mg/dL

## 2019-07-03 MED ORDER — PANTOPRAZOLE SODIUM 40 MG PO TBEC
40.0000 mg | DELAYED_RELEASE_TABLET | Freq: Every day | ORAL | Status: DC
  Filled 2019-07-03 (×4): qty 1

## 2019-07-03 MED ORDER — ACETAMINOPHEN 650 MG RE SUPP: 650.0000 mg | Freq: Four times a day (QID) | RECTAL | Status: DC | PRN

## 2019-07-03 MED ORDER — ACETAMINOPHEN 325 MG PO TABS: 650.0000 mg | ORAL_TABLET | Freq: Four times a day (QID) | ORAL | Status: DC | PRN

## 2019-07-03 MED ORDER — HYDROCODONE-ACETAMINOPHEN 5-325 MG PO TABS
1.0000 | ORAL_TABLET | ORAL | Status: DC | PRN
  Administered 2019-07-09 – 2019-07-11 (×10): 2 via ORAL
  Filled 2019-07-03 (×11): qty 2

## 2019-07-03 MED ORDER — BOOST / RESOURCE BREEZE PO LIQD CUSTOM
1.0000 | Freq: Three times a day (TID) | ORAL | Status: DC
  Administered 2019-07-03: 1 via ORAL

## 2019-07-03 MED ORDER — SODIUM CHLORIDE 0.9 % IV SOLN: INTRAVENOUS | Status: DC

## 2019-07-03 MED ORDER — ONDANSETRON HCL 4 MG/2ML IJ SOLN
4.0000 mg | Freq: Four times a day (QID) | INTRAMUSCULAR | Status: DC | PRN
  Administered 2019-07-03 – 2019-07-16 (×17): 4 mg via INTRAVENOUS
  Filled 2019-07-03 (×21): qty 2

## 2019-07-03 MED ORDER — ONDANSETRON HCL 4 MG PO TABS
4.0000 mg | ORAL_TABLET | Freq: Four times a day (QID) | ORAL | Status: DC | PRN
  Filled 2019-07-03: qty 1

## 2019-07-03 MED ORDER — MORPHINE SULFATE (PF) 4 MG/ML IV SOLN
4.0000 mg | INTRAVENOUS | Status: DC | PRN
  Administered 2019-07-03 – 2019-07-05 (×11): 4 mg via INTRAVENOUS
  Filled 2019-07-03 (×11): qty 1

## 2019-07-03 MED ORDER — POLYETHYLENE GLYCOL 3350 17 G PO PACK
17.0000 g | PACK | Freq: Every day | ORAL | Status: DC | PRN
  Administered 2019-07-07 – 2019-07-08 (×2): 17 g via ORAL
  Filled 2019-07-03 (×2): qty 1

## 2019-07-03 MED ORDER — ZOLPIDEM TARTRATE 5 MG PO TABS
5.0000 mg | ORAL_TABLET | Freq: Every evening | ORAL | Status: DC | PRN
  Administered 2019-07-09: 5 mg via ORAL
  Filled 2019-07-03: qty 1

## 2019-07-03 MED ORDER — SIMETHICONE 40 MG/0.6ML PO SUSP
40.0000 mg | Freq: Four times a day (QID) | ORAL | Status: DC | PRN
  Administered 2019-07-03 – 2019-07-12 (×25): 40 mg via ORAL
  Filled 2019-07-03 (×29): qty 0.6

## 2019-07-03 NOTE — Progress Notes (Signed)
[facility] PROGRESS NOTE  Rachel Vasquez [id number] DOB: Jan 22, 1956 DOA: 07/02/2019 PCP: Patient, [name] Per  Assessment/Plan:  #1.Pancreatic mass. 3 month hx of intermittent abdominal pain with n/v. CT reveals ill defined mass at head of pancreas. Pain only slightly improved. Tolerating clear liquid with zofran. Will need biopsy and patient insisting on having that done inpatient -GI consult -advance to full liquids -zofran -analgesia  #2. Liver lesion. Per CT.  -see #1  #3. Nausea/vomiting/gerd. Continues with nausea. No vomiting. Tolerating clear liquids. Related to above.  -continue zofran -clear liquid diet -gentle IV fluids -analgesia -PPI -await GI recs  #4. Hiatal hernia. stable  Code Status: full Family Communication: patient Disposition Plan: home when readyu   Consultants:  [facility]  Procedures:    Antibiotics:    HPI/Subjective:   Objective: Vitals:   07/03/19 0816 07/03/19 1230  BP: (!) 148/80 140/90  Pulse: 86 90  Resp:  16  Temp:  98 F (36.7 C)  SpO2:  98%    Intake/Output Summary (Last 24 hours) at 07/03/2019 1412 Last data filed at 07/03/2019 0400 Gross per 24 hour  Intake 1170.57 ml  Output --  Net 1170.57 ml   Filed Weights   07/02/19 1437 07/03/19 0500 07/03/19 0554  Weight: 111.6 kg 111.6 kg 116.1 kg    Exam:   General:  Awake alert talking on phone  Cardiovascular: rrr no MGR no LE edema  Respiratory: normal effort BS clear bilaterally no wheeze  Abdomen: obese soft +BS non-tender no guarding or rebounding  Musculoskeletal: joints without swelling/erythema   Data Reviewed: Basic Metabolic Panel: Recent Labs  Lab 06/30/19 1432 07/02/19 1448 07/03/19 0522  NA 137 138 138  K 3.4* 3.5 3.6  CL 97* 97* 100  CO2 27 27 27   GLUCOSE 152* 138* 122*  BUN 9 12 12   CREATININE 0.92 0.90 0.87  CALCIUM 9.7 9.4 8.9   Liver Function Tests: Recent Labs  Lab 06/30/19 1432 07/02/19 1448 07/03/19 0522   AST 21 21 22   ALT 24 24 24   ALKPHOS 82 85 73  BILITOT 0.8 0.7 1.0  PROT 8.4* 7.8 6.9  ALBUMIN 3.7 3.5 3.0*   Recent Labs  Lab 06/30/19 1432 07/02/19 1448  LIPASE 87* 92*   No results for input(s): AMMONIA in the last 168 hours. CBC: Recent Labs  Lab 06/30/19 1432 07/02/19 1448 07/03/19 0522  WBC 5.6 5.6 5.5  HGB 14.0 13.1 11.9*  HCT 44.1 42.5 38.4  MCV 73.0* 74.3* 73.8*  PLT 320 297 228   Cardiac Enzymes: No results for input(s): CKTOTAL, CKMB, CKMBINDEX, TROPONINI in the last 168 hours. BNP (last 3 results) No results for input(s): BNP in the last 8760 hours.  ProBNP (last 3 results) No results for input(s): PROBNP in the last 8760 hours.  CBG: No results for input(s): GLUCAP in the last 168 hours.  Recent Results (from the past 240 hour(s))  SARS CORONAVIRUS 2 (TAT 6-24 HRS) Nasopharyngeal Nasopharyngeal Swab     Status: None   Collection Time: 07/02/19 11:40 PM   Specimen: Nasopharyngeal Swab  Result Value Ref Range Status   SARS Coronavirus 2 NEGATIVE NEGATIVE Final    Comment: (NOTE) SARS-CoV-2 target nucleic acids are NOT DETECTED. The SARS-CoV-2 RNA is generally detectable in upper and lower respiratory specimens during the acute phase of infection. Negative results do not preclude SARS-CoV-2 infection, do not rule out co-infections with other pathogens, and should not be used as the sole basis for treatment or other  patient management decisions. Negative results must be combined with clinical observations, patient history, and epidemiological information. The expected result is Negative. Fact Sheet for Patients: SugarRoll.be Fact Sheet for Healthcare Providers: https://www.woods-mathews.com/ This test is not yet approved or cleared by the [place] FDA and  has been authorized for detection and/or diagnosis of SARS-CoV-2 by FDA under an Emergency Use Authorization (EUA). This EUA will remain  in effect  (meaning this test can be used) for the duration of the COVID-19 declaration under Section 56 4(b)(1) of the Act, 21 U.S.C. section 360bbb-3(b)(1), unless the authorization is terminated or revoked sooner. Performed at [facility], [place] [address] [place], [place] [address]      Studies: CT Abdomen Pelvis W Contrast  Result Date: 07/02/2019 CLINICAL DATA:  Abdominal pain with nausea and vomiting EXAM: CT ABDOMEN AND PELVIS WITH CONTRAST TECHNIQUE: Multidetector CT imaging of the abdomen and pelvis was performed using the standard protocol following bolus administration of intravenous contrast. CONTRAST:  129mL OMNIPAQUE IOHEXOL 300 MG/ML  SOLN COMPARISON:  CT 04/26/2004 FINDINGS: Lower chest: Lung bases demonstrate subsegmental atelectasis at the lingula, right middle lobe and inferior right upper lobe. No acute consolidation or pleural effusion. The heart size is within normal limits. Irregular moderate hiatal hernia with wall thickening. This is increased compared to prior. Hepatobiliary: Scattered hypodense liver lesions, some of which were present on comparison study. Multiple new ill-defined hypodense liver masses concerning for metastatic disease. Largest suspicious lesion is seen in the right hepatic dome and measures 2.5 cm. Multiple calcified gallstones. No biliary dilatation. Pancreas: Generalized inflammatory changes about the pancreas. Development of mild ductal dilatation. Ill-defined hypodense mass at the head of the pancreas measuring approximately 3.4 cm transverse by 2.2 cm AP by 3 cm craniocaudad. Spleen: Normal in size without focal abnormality. Adrenals/Urinary Tract: Adrenal glands are unremarkable. Kidneys are normal, without renal calculi, focal lesion, or hydronephrosis. Bladder is unremarkable. Stomach/Bowel: No dilated small bowel. Negative for colon wall thickening. Negative appendix. Vascular/Lymphatic: Nonaneurysmal aorta. Hazy appearance of mesenteric root with  subcentimeter nodes. Multiple small nodes or punctate nodules in the right lower quadrant/right gutter. Hypoenhancement of the distal splenic vein and superior mesenteric vein. Reproductive: Markedly enlarged uterus with multiple masses, increased compared to prior. Other: Negative for free air or significant ascites. Anterior abdominal wall laxity. Small supraumbilical fat containing ventral hernia Musculoskeletal: No acute or suspicious osseous abnormality. IMPRESSION: 1. Generalized pancreatic inflammation. There is an ill-defined hypodense suspicious mass at the head of the pancreas measuring 3.4 x 2.2 x 3 cm. 2. Multiple ill-defined hypodense liver masses, consistent with metastatic disease. 3. Hypoenhancement of the splenic vein and superior mesenteric veins near their confluence, potentially occluded. 4. Multiple punctate nodules or small nodes in the right lower quadrant/gutter, cannot exclude metastatic disease 5. Moderate irregular hiatal hernia with some wall thickening, could be correlated with endoscopy 6. Bulky enlargement of the uterus with multiple large masses, increased compared to prior. Electronically Signed   By: Donavan Foil M.D.   On: 07/02/2019 22:17    Scheduled Meds: . feeding supplement  1 Container Oral TID BM  . pantoprazole  40 mg Oral Daily   Continuous Infusions: . sodium chloride      Principal Problem:   Mass of pancreas Active Problems:   Nausea & vomiting   Liver lesion   Abdominal pain, epigastric   GERD (gastroesophageal reflux disease)   Hiatal hernia    Time spent: 33 minutes    Radene Gunning NP  [facility]  If 7PM-7AM, please contact night-coverage at [email or web address], password [facility] 07/03/2019, 2:12 PM  LOS: 0 days

## 2019-07-03 NOTE — Consult Note (Signed)
Subjective:   HPI  The patient is a 63 year old female who has been having difficulty with abdominal pain, nausea, and difficulty wanting to eat since September.  She thought it was all from reflux.  She finally came to the hospital where she had a CT scan which showed generalized pancreatic inflammation and there was an ill-defined hypodense suspicious mass at the head of the pancreas measuring 3. 4 x 2.2 x 3 cm.  There were multiple ill-defined hypodense liver masses also consistent with metastatic disease.  We were asked to consult in regards to this.  Review of Systems No chest pain or shortness of breath  Past Medical History:  Diagnosis Date  . GERD (gastroesophageal reflux disease)   . Hiatal hernia   . Liver lesion   . Medical history non-contributory   . Pancreatic mass    History reviewed. No pertinent surgical history. Social History   Socioeconomic History  . Marital status: Single    Spouse name: Not on file  . Number of children: Not on file  . Years of education: Not on file  . Highest education level: Not on file  Occupational History  . Not on file  Tobacco Use  . Smoking status: Never Smoker  . Smokeless tobacco: Never Used  Substance and Sexual Activity  . Alcohol use: Yes  . Drug use: Never  . Sexual activity: Yes  Other Topics Concern  . Not on file  Social History Narrative  . Not on file   Social Determinants of Health   Financial Resource Strain:   . Difficulty of Paying Living Expenses: Not on file  Food Insecurity:   . Worried About Charity fundraiser in the Last Year: Not on file  . Ran Out of Food in the Last Year: Not on file  Transportation Needs:   . Lack of Transportation (Medical): Not on file  . Lack of Transportation (Non-Medical): Not on file  Physical Activity:   . Days of Exercise per Week: Not on file  . Minutes of Exercise per Session: Not on file  Stress:   . Feeling of Stress : Not on file  Social Connections:   .  Frequency of Communication with Friends and Family: Not on file  . Frequency of Social Gatherings with Friends and Family: Not on file  . Attends Religious Services: Not on file  . Active Member of Clubs or Organizations: Not on file  . Attends Archivist Meetings: Not on file  . Marital Status: Not on file  Intimate Partner Violence:   . Fear of Current or Ex-Partner: Not on file  . Emotionally Abused: Not on file  . Physically Abused: Not on file  . Sexually Abused: Not on file   family history includes Pancreatic cancer in her father.  Current Facility-Administered Medications:  .  0.9 %  sodium chloride infusion, , Intravenous, Continuous, Black, Karen M, NP, Last Rate: 75 mL/hr at 07/03/19 1415, New Bag at 07/03/19 1415 .  acetaminophen (TYLENOL) tablet 650 mg, 650 mg, Oral, Q6H PRN **OR** acetaminophen (TYLENOL) suppository 650 mg, 650 mg, Rectal, Q6H PRN, Opyd, Ilene Qua, MD .  feeding supplement (BOOST / RESOURCE BREEZE) liquid 1 Container, 1 Container, Oral, TID BM, Vann, Jessica U, DO, 1 Container at 07/03/19 1401 .  HYDROcodone-acetaminophen (NORCO/VICODIN) 5-325 MG per tablet 1-2 tablet, 1-2 tablet, Oral, Q4H PRN, Opyd, Timothy S, MD .  morphine 4 MG/ML injection 4 mg, 4 mg, Intravenous, Q4H PRN, Opyd, Ilene Qua, MD,  4 mg at 07/03/19 1234 .  ondansetron (ZOFRAN) tablet 4 mg, 4 mg, Oral, Q6H PRN **OR** ondansetron (ZOFRAN) injection 4 mg, 4 mg, Intravenous, Q6H PRN, Opyd, Ilene Qua, MD, 4 mg at 07/03/19 1235 .  pantoprazole (PROTONIX) EC tablet 40 mg, 40 mg, Oral, Daily, Opyd, Timothy S, MD .  polyethylene glycol (MIRALAX / GLYCOLAX) packet 17 g, 17 g, Oral, Daily PRN, Opyd, Ilene Qua, MD .  simethicone (MYLICON) 40 [id number] suspension 40 mg, 40 mg, Oral, QID PRN, Opyd, Ilene Qua, MD, 40 mg at 07/03/19 1242 .  zolpidem (AMBIEN) tablet 5 mg, 5 mg, Oral, QHS PRN, Opyd, Ilene Qua, MD No Known Allergies   Objective:     BP 140/90 (BP Location: Left Arm)   Pulse 90    Temp 98 F (36.7 C) (Oral)   Resp 16   Ht 5\' 7"  (1.702 m)   Wt 116.1 kg   SpO2 98%   BMI 40.10 kg/m   No distress  Nonicteric  Heart regular rhythm no murmurs  Lungs clear  Abdomen: Bowel sounds present, soft, nontender  Laboratory No components found for: D1    Assessment:     Pancreatitis  Pancreatic mass      Plan:     Treat pancreatitis for now.  Consider EUS with pancreatic biopsy.  Dr. Paulita Fujita will further evaluate for this.

## 2019-07-03 NOTE — Progress Notes (Addendum)
Initial Nutrition Assessment  DOCUMENTATION CODES:   Obesity unspecified  INTERVENTION:    Peach Boost Breeze po TID, each supplement provides 250 kcal and 9 grams of protein  NUTRITION DIAGNOSIS:   Inadequate oral intake related to nausea, vomiting as evidenced by per patient/family report.  GOAL:   Patient will meet greater than or equal to 90% of their needs  MONITOR:   PO intake, Supplement acceptance  REASON FOR ASSESSMENT:   Consult Diet education  ASSESSMENT:   63 yo female admitted with progressive nausea, vomiting, loss of appetite, fatigue, epigastric pain. CT scan showed mass at the head of the pancreas and hypodense liver lesions. No known PMH.   Patient requested a consult with RD to discuss foods that she can tolerate. She has had progressive abdominal pain & cramps for the past 3 months and for the past 2 weeks has only been able to tolerate a slice of watermelon and a pear every day. She can only tolerate small amounts of water. She says that she doesn't even tolerate mashed potatoes. Discussed trialing different foods to see what she can tolerate. Encouraged her to eat small, frequent meals, focusing on bland soft foods. Suspect it is not a specific food or food type that she is not tolerating, as all types of foods seem to cause her pain. Patient tried peach Boost Breeze supplement and seemed to tolerate it okay. She is willing to try to drink these. Discussed where to find these supplements when she leaves the hospital; usually [facility] has them available for purchase.   Patient is unsure of her usual weight, but thinks she has lost 13 lbs since September. 5% weight loss within 3 months is not significant for the time frame. Also suspect some of the weight loss is related to dehydration.  Labs reviewed. A1C 6.5 (H), glucose 122 (H)  Medications reviewed and include protonix.  NUTRITION - FOCUSED PHYSICAL EXAM:    Most Recent Value  Orbital Region  No  depletion  Upper Arm Region  No depletion  Thoracic and Lumbar Region  No depletion  Buccal Region  No depletion  Temple Region  No depletion  Clavicle Bone Region  No depletion  Clavicle and Acromion Bone Region  No depletion  Scapular Bone Region  No depletion  Dorsal Hand  No depletion  Patellar Region  No depletion  Anterior Thigh Region  No depletion  Posterior Calf Region  No depletion  Edema (RD Assessment)  None  Hair  Reviewed  Eyes  Reviewed  Mouth  Reviewed  Skin  Reviewed  Nails  Reviewed       Diet Order:   Diet Order            Diet full liquid Room service appropriate? Yes; Fluid consistency: Thin  Diet effective now              EDUCATION NEEDS:   Education needs have been addressed  Skin:  Skin Assessment: Reviewed RN Assessment  Last BM:  12/30  Height:   Ht Readings from Last 1 Encounters:  07/02/19 5\' 7"  (1.702 m)    Weight:   Wt Readings from Last 1 Encounters:  07/03/19 116.1 kg    Ideal Body Weight:  61.4 kg  BMI:  Body mass index is 40.1 kg/m.  Estimated Nutritional Needs:   Kcal:  1800-2100  Protein:  115-130 gm  Fluid:  >/= 1.8 L    Molli Barrows, RD, LDN, [place] Pager [phone] After Hours Pager  [phone]  

## 2019-07-04 LAB — BASIC METABOLIC PANEL
Anion gap: 10 (ref 5–15)
BUN: 9 mg/dL (ref 8–23)
CO2: 27 mmol/L (ref 22–32)
Calcium: 8.7 mg/dL — ABNORMAL LOW (ref 8.9–10.3)
Chloride: 102 mmol/L (ref 98–111)
Creatinine, Ser: 0.79 mg/dL (ref 0.44–1.00)
GFR calc Af Amer: >60 mL/min (ref >60)
GFR calc non Af Amer: >60 mL/min (ref >60)
Glucose, Bld: 121 mg/dL — ABNORMAL HIGH (ref 70–99)
Potassium: 3.5 mmol/L (ref 3.5–5.1)
Sodium: 139 mmol/L (ref 135–145)

## 2019-07-04 LAB — HEPATIC FUNCTION PANEL
ALT: 45 U/L — ABNORMAL HIGH (ref 0–44)
AST: 44 U/L — ABNORMAL HIGH (ref 15–41)
Albumin: 2.7 g/dL — ABNORMAL LOW (ref 3.5–5.0)
Alkaline Phosphatase: 90 U/L (ref 38–126)
Bilirubin, Direct: 0.4 mg/dL — ABNORMAL HIGH (ref 0.0–0.2)
Indirect Bilirubin: 0.9 mg/dL (ref 0.3–0.9)
Total Bilirubin: 1.3 mg/dL — ABNORMAL HIGH (ref 0.3–1.2)
Total Protein: 6.4 g/dL — ABNORMAL LOW (ref 6.5–8.1)

## 2019-07-04 LAB — CBC
HCT: 36.3 % (ref 36.0–46.0)
Hemoglobin: 11.3 g/dL — ABNORMAL LOW (ref 12.0–15.0)
MCH: 23.3 pg — ABNORMAL LOW (ref 26.0–34.0)
MCHC: 31.1 g/dL (ref 30.0–36.0)
MCV: 74.7 fL — ABNORMAL LOW (ref 80.0–100.0)
Platelets: 228 10*3/uL (ref 150–400)
RBC: 4.86 MIL/uL (ref 3.87–5.11)
RDW: 14.3 % (ref 11.5–15.5)
WBC: 4.4 10*3/uL (ref 4.0–10.5)
nRBC: 0 % (ref 0.0–0.2)

## 2019-07-04 LAB — URINALYSIS, ROUTINE W REFLEX MICROSCOPIC
Glucose, UA: NEGATIVE mg/dL
Ketones, ur: 20 mg/dL — AB
Leukocytes,Ua: NEGATIVE
Nitrite: NEGATIVE
Protein, ur: 30 mg/dL — AB
Specific Gravity, Urine: 1.026 (ref 1.005–1.030)
pH: 5 (ref 5.0–8.0)

## 2019-07-04 NOTE — Progress Notes (Signed)
Subjective: Ongoing abdominal pain.  Objective: Vital signs in last 24 hours: Temp:  [97.7 F (36.5 C)-98.7 F (37.1 C)] 97.7 F (36.5 C) (01/01 0610) Pulse Rate:  [72-90] 72 (01/01 0610) Resp:  [16-18] 18 (01/01 0610) BP: (118-140)/(58-90) 137/72 (01/01 0610) SpO2:  [96 %-98 %] 98 % (01/01 0610) Weight:  [113.9 kg] 113.9 kg (01/01 0610) Weight change: 2.315 kg Last BM Date: 07/02/19  PE: GEN:  NAD ABD:  Soft, upper abdominal tenderness without peritonitis  Lab Results: CBC    Component Value Date/Time   WBC 4.4 07/04/2019 0135   RBC 4.86 07/04/2019 0135   HGB 11.3 (L) 07/04/2019 0135   HCT 36.3 07/04/2019 0135   PLT 228 07/04/2019 0135   MCV 74.7 (L) 07/04/2019 0135   MCH 23.3 (L) 07/04/2019 0135   MCHC 31.1 07/04/2019 0135   RDW 14.3 07/04/2019 0135   CMP     Component Value Date/Time   NA 139 07/04/2019 0135   K 3.5 07/04/2019 0135   CL 102 07/04/2019 0135   CO2 27 07/04/2019 0135   GLUCOSE 121 (H) 07/04/2019 0135   BUN 9 07/04/2019 0135   CREATININE 0.79 07/04/2019 0135   CALCIUM 8.7 (L) 07/04/2019 0135   PROT 6.9 07/03/2019 0522   ALBUMIN 3.0 (L) 07/03/2019 0522   AST 22 07/03/2019 0522   ALT 24 07/03/2019 0522   ALKPHOS 73 07/03/2019 0522   BILITOT 1.0 07/03/2019 0522   GFRNONAA >60 07/04/2019 0135   GFRAA >60 07/04/2019 0135   Assessment:  1.  Progressive abdominal pain, chest pain, back pain. 2.  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 Generalized pancreatitis.  Likely source of #1 above.  No evidence of biliary obstruction. 3.  Pancreatitis.  Likely due to #2 above.  Failure to thrive with weight loss (20-30 lbs).  Plan:  1.  I have personally reviewed the patient's CT scan.  I feel the best way to approach this is to get IR consultation for consideration of U/S or CT-guided biopsy of one of her liver lesions.  I would not pursue EUS-guided biopsy unless liver biopsy unsuccessful.  If for whatever reason liver biopsy  is not possible or feasible, we would have to wait at least a few days prior to consideration of EUS-guided biopsy given patient's diffuse pancreatitis. 2.  [facility] will follow along at a distance; please call us back with any questions or concerns.   Landry Dyke 07/04/2019, 9:27 AM   Cell [phone] If no answer or after 5 PM call [phone]

## 2019-07-04 NOTE — Progress Notes (Signed)
UA ordered, specimen cup provided. Instructed pt to call when able to obtain sample.

## 2019-07-04 NOTE — Progress Notes (Signed)
Pt reports that after she voided she "wiped bright pink blood and she feels like she has UTI."  UA was obtained on 12/30, MD notified.

## 2019-07-04 NOTE — Progress Notes (Signed)
Progress Note    Rachel Vasquez  [id number] DOB: [birth year]  DOA: 07/02/2019 PCP: Patient, [name] Per    Brief Narrative:     Medical records reviewed and are as summarized below:  Rachel Vasquez is an 64 y.o. female here with 3 months of progressive nausea and vomiting/abdominal pain and weakness.  She was seen an urgent care and then referred to [facility].  Dr. Marye Round: Further work-up with ultrasound and EGD to be performed. I asked her to start Protonix 40 mg twice daily and a Zofran has been given to be taken as needed for nausea in the interim.  Unfortunately patient continued to worsen and presented to the ER for evaluation.  CT scan showed Generalized pancreatic inflammation. There is an ill-defined hypodense suspicious mass at the head of the pancreas measuring 3.4 x 2.2 x 3 cm. Multiple ill-defined hypodense liver masses, consistent with metastatic disease.Bulky enlargement of the uterus with multiple large masses, increased compared to prior. Multiple punctate nodules or small nodes in the right lower quadrant/gutter, cannot exclude metastatic disease   Assessment/Plan:   Principal Problem:   Mass of pancreas Active Problems:   Nausea & vomiting   GERD (gastroesophageal reflux disease)   Hiatal hernia   Liver lesion   Abdominal pain, epigastric   Pancreatic mass   Pancreatic mass with liver lesions - 3 month hx of intermittent abdominal pain with n/v.  -CT reveals ill defined mass at head of pancreas.  -continues to have pain and tolerate only few sips-- likes the vegetable broth -Will need biopsy: per Dr. Paulita Fujita: best way to approach this is to get IR consultation for consideration of U/S or CT-guided biopsy of one of her liver lesions.  I would not pursue EUS-guided biopsy unless liver biopsy unsuccessful.  If for whatever reason liver biopsy is not possible or feasible, we would have to wait at least a few days prior to consideration of EUS-guided biopsy given  patient's diffuse pancreatitis -GI consult appreciated -IR consult placed -trend LFTs   Nausea/vomiting/gerd.  -continue zofran -liquid diet -IVF -pain control -PPI  obesity Body mass index is 39.33 kg/m.   Family Communication/Anticipated D/C date and plan/Code Status   DVT prophylaxis: scd/ambulation Code Status: Full Code.  Family Communication: Disposition Plan: pending biopsy- will need referral to oncology pending results   Medical Consultants:    GI  IR     Subjective:   In bed, tolerating some of the vegetable broth  Objective:    Vitals:   07/03/19 2038 07/04/19 0021 07/04/19 0610 07/04/19 1240  BP: 139/74 118/60 137/72 139/72  Pulse: 78 83 72 78  Resp: 18 18 18 16   Temp: 98.1 F (36.7 C) 98 F (36.7 C) 97.7 F (36.5 C) 97.8 F (36.6 C)  TempSrc: Oral Oral Oral Oral  SpO2: 97% 96% 98% 99%  Weight:   113.9 kg   Height:        Intake/Output Summary (Last 24 hours) at 07/04/2019 1242 Last data filed at 07/04/2019 [id number] Gross per 24 hour  Intake 1670.6 ml  Output 100 ml  Net 1570.6 ml   Filed Weights   07/03/19 0500 07/03/19 0554 07/04/19 0610  Weight: 111.6 kg 116.1 kg 113.9 kg    Exam: In bed, appears comfortable, anxious No increased work of breathing +BS, soft, obese, NT A+OX3  Data Reviewed:   I have personally reviewed following labs and imaging studies:  Labs: Labs show the following:   Basic Metabolic Panel:  Recent Labs  Lab 06/30/19 1432 07/02/19 1448 07/03/19 0522 07/04/19 0135  NA 137 138 138 139  K 3.4* 3.5 3.6 3.5  CL 97* 97* 100 102  CO2 27 27 27 27   GLUCOSE 152* 138* 122* 121*  BUN 9 12 12 9   CREATININE 0.92 0.90 0.87 0.79  CALCIUM 9.7 9.4 8.9 8.7*   GFR Estimated Creatinine Clearance: 92.5 mL/min (by C-G formula based on SCr of 0.79 mg/dL). Liver Function Tests: Recent Labs  Lab 06/30/19 1432 07/02/19 1448 07/03/19 0522 07/04/19 1107  AST 21 21 22  44*  ALT 24 24 24  45*  ALKPHOS 82 85 73 90   BILITOT 0.8 0.7 1.0 1.3*  PROT 8.4* 7.8 6.9 6.4*  ALBUMIN 3.7 3.5 3.0* 2.7*   Recent Labs  Lab 06/30/19 1432 07/02/19 1448  LIPASE 87* 92*   No results for input(s): AMMONIA in the last 168 hours. Coagulation profile No results for input(s): INR, PROTIME in the last 168 hours.  CBC: Recent Labs  Lab 06/30/19 1432 07/02/19 1448 07/03/19 0522 07/04/19 0135  WBC 5.6 5.6 5.5 4.4  HGB 14.0 13.1 11.9* 11.3*  HCT 44.1 42.5 38.4 36.3  MCV 73.0* 74.3* 73.8* 74.7*  PLT 320 297 228 228   Cardiac Enzymes: No results for input(s): CKTOTAL, CKMB, CKMBINDEX, TROPONINI in the last 168 hours. BNP (last 3 results) No results for input(s): PROBNP in the last 8760 hours. CBG: No results for input(s): GLUCAP in the last 168 hours. D-Dimer: No results for input(s): DDIMER in the last 72 hours. Hgb A1c: Recent Labs    07/03/19 0522  HGBA1C 6.5*   Lipid Profile: No results for input(s): CHOL, HDL, LDLCALC, TRIG, CHOLHDL, LDLDIRECT in the last 72 hours. Thyroid function studies: No results for input(s): TSH, T4TOTAL, T3FREE, THYROIDAB in the last 72 hours.  Invalid input(s): FREET3 Anemia work up: No results for input(s): VITAMINB12, FOLATE, FERRITIN, TIBC, IRON, RETICCTPCT in the last 72 hours. Sepsis Labs: Recent Labs  Lab 06/30/19 1432 07/02/19 1448 07/03/19 0522 07/04/19 0135  WBC 5.6 5.6 5.5 4.4    Microbiology Recent Results (from the past 240 hour(s))  SARS CORONAVIRUS 2 (TAT 6-24 HRS) Nasopharyngeal Nasopharyngeal Swab     Status: None   Collection Time: 07/02/19 11:40 PM   Specimen: Nasopharyngeal Swab  Result Value Ref Range Status   SARS Coronavirus 2 NEGATIVE NEGATIVE Final    Comment: (NOTE) SARS-CoV-2 target nucleic acids are NOT DETECTED. The SARS-CoV-2 RNA is generally detectable in upper and lower respiratory specimens during the acute phase of infection. Negative results do not preclude SARS-CoV-2 infection, do not rule out co-infections with other  pathogens, and should not be used as the sole basis for treatment or other patient management decisions. Negative results must be combined with clinical observations, patient history, and epidemiological information. The expected result is Negative. Fact Sheet for Patients: SugarRoll.be Fact Sheet for Healthcare Providers: https://www.woods-mathews.com/ This test is not yet approved or cleared by the [place] FDA and  has been authorized for detection and/or diagnosis of SARS-CoV-2 by FDA under an Emergency Use Authorization (EUA). This EUA will remain  in effect (meaning this test can be used) for the duration of the COVID-19 declaration under Section 56 4(b)(1) of the Act, 21 U.S.C. section 360bbb-3(b)(1), unless the authorization is terminated or revoked sooner. Performed at [facility], [place] [address], [place], [place] [address]     Procedures and diagnostic studies:  CT Abdomen Pelvis W Contrast  Result Date: 07/02/2019  CLINICAL DATA:  Abdominal pain with nausea and vomiting EXAM: CT ABDOMEN AND PELVIS WITH CONTRAST TECHNIQUE: Multidetector CT imaging of the abdomen and pelvis was performed using the standard protocol following bolus administration of intravenous contrast. CONTRAST:  167mL OMNIPAQUE IOHEXOL 300 MG/ML  SOLN COMPARISON:  CT 04/26/2004 FINDINGS: Lower chest: Lung bases demonstrate subsegmental atelectasis at the lingula, right middle lobe and inferior right upper lobe. No acute consolidation or pleural effusion. The heart size is within normal limits. Irregular moderate hiatal hernia with wall thickening. This is increased compared to prior. Hepatobiliary: Scattered hypodense liver lesions, some of which were present on comparison study. Multiple new ill-defined hypodense liver masses concerning for metastatic disease. Largest suspicious lesion is seen in the right hepatic dome and measures 2.5 cm. Multiple calcified  gallstones. No biliary dilatation. Pancreas: Generalized inflammatory changes about the pancreas. Development of mild ductal dilatation. Ill-defined hypodense mass at the head of the pancreas measuring approximately 3.4 cm transverse by 2.2 cm AP by 3 cm craniocaudad. Spleen: Normal in size without focal abnormality. Adrenals/Urinary Tract: Adrenal glands are unremarkable. Kidneys are normal, without renal calculi, focal lesion, or hydronephrosis. Bladder is unremarkable. Stomach/Bowel: No dilated small bowel. Negative for colon wall thickening. Negative appendix. Vascular/Lymphatic: Nonaneurysmal aorta. Hazy appearance of mesenteric root with subcentimeter nodes. Multiple small nodes or punctate nodules in the right lower quadrant/right gutter. Hypoenhancement of the distal splenic vein and superior mesenteric vein. Reproductive: Markedly enlarged uterus with multiple masses, increased compared to prior. Other: Negative for free air or significant ascites. Anterior abdominal wall laxity. Small supraumbilical fat containing ventral hernia Musculoskeletal: No acute or suspicious osseous abnormality. IMPRESSION: 1. Generalized pancreatic inflammation. There is an ill-defined hypodense suspicious mass at the head of the pancreas measuring 3.4 x 2.2 x 3 cm. 2. Multiple ill-defined hypodense liver masses, consistent with metastatic disease. 3. Hypoenhancement of the splenic vein and superior mesenteric veins near their confluence, potentially occluded. 4. Multiple punctate nodules or small nodes in the right lower quadrant/gutter, cannot exclude metastatic disease 5. Moderate irregular hiatal hernia with some wall thickening, could be correlated with endoscopy 6. Bulky enlargement of the uterus with multiple large masses, increased compared to prior. Electronically Signed   By: Donavan Foil M.D.   On: 07/02/2019 22:17    Medications:   . feeding supplement  1 Container Oral TID BM  . pantoprazole  40 mg Oral Daily    Continuous Infusions: . sodium chloride 125 mL/hr at 07/04/19 1130     LOS: 1 day   Geradine Girt  [facility]   How to contact the [facility] Attending or Consulting provider [place] or covering provider during after hours [place], for this patient?  1. Check the care team in [facility] and look for a) attending/consulting TRH provider listed and b) the [facility] team listed 2. Log into [email or web address] and use [place]'s universal password to access. If you do not have the password, please contact the hospital operator. 3. Locate the [facility] provider you are looking for under [facility] and page to a number that you can be directly reached. 4. If you still have difficulty reaching the provider, please page the [facility] (Director on Call) for the Hospitalists listed on amion for assistance.  07/04/2019, 12:42 PM

## 2019-07-05 LAB — COMPREHENSIVE METABOLIC PANEL
ALT: 51 U/L — ABNORMAL HIGH (ref 0–44)
AST: 41 U/L (ref 15–41)
Albumin: 2.9 g/dL — ABNORMAL LOW (ref 3.5–5.0)
Alkaline Phosphatase: 97 U/L (ref 38–126)
Anion gap: 10 (ref 5–15)
BUN: <5 mg/dL — ABNORMAL LOW (ref 8–23)
CO2: 26 mmol/L (ref 22–32)
Calcium: 8.6 mg/dL — ABNORMAL LOW (ref 8.9–10.3)
Chloride: 101 mmol/L (ref 98–111)
Creatinine, Ser: 0.75 mg/dL (ref 0.44–1.00)
GFR calc Af Amer: >60 mL/min (ref >60)
GFR calc non Af Amer: >60 mL/min (ref >60)
Glucose, Bld: 120 mg/dL — ABNORMAL HIGH (ref 70–99)
Potassium: 3.3 mmol/L — ABNORMAL LOW (ref 3.5–5.1)
Sodium: 137 mmol/L (ref 135–145)
Total Bilirubin: 1 mg/dL (ref 0.3–1.2)
Total Protein: 6.8 g/dL (ref 6.5–8.1)

## 2019-07-05 MED ORDER — MORPHINE SULFATE (PF) 4 MG/ML IV SOLN
4.0000 mg | INTRAVENOUS | Status: DC | PRN
  Administered 2019-07-05 – 2019-07-13 (×29): 4 mg via INTRAVENOUS
  Filled 2019-07-05 (×32): qty 1

## 2019-07-05 MED ORDER — POTASSIUM CHLORIDE IN NACL 20-0.9 MEQ/L-% IV SOLN
INTRAVENOUS | Status: DC
  Filled 2019-07-05 (×2): qty 1000

## 2019-07-05 NOTE — Progress Notes (Signed)
Progress Note    Rachel Vasquez  [id number] DOB: Jan 08, 1956  DOA: 07/02/2019 PCP: Patient, [name] Per    Brief Narrative:     Medical records reviewed and are as summarized below:  Rachel Vasquez is an 64 y.o. female here with 3 months of progressive nausea and vomiting/abdominal pain and weakness.  She was seen an urgent care and then referred to [facility].  Dr. Marye Round: Further work-up with ultrasound and EGD to be performed. I asked her to start Protonix 40 mg twice daily and a Zofran has been given to be taken as needed for nausea in the interim.  Unfortunately patient continued to worsen and presented to the ER for evaluation.  CT scan showed Generalized pancreatic inflammation. There is an ill-defined hypodense suspicious mass at the head of the pancreas measuring 3.4 x 2.2 x 3 cm. Multiple ill-defined hypodense liver masses, consistent with metastatic disease.Bulky enlargement of the uterus with multiple large masses, increased compared to prior. Multiple punctate nodules or small nodes in the right lower quadrant/gutter, cannot exclude metastatic disease   Assessment/Plan:   Principal Problem:   Mass of pancreas Active Problems:   Nausea & vomiting   GERD (gastroesophageal reflux disease)   Hiatal hernia   Liver lesion   Abdominal pain, epigastric   Pancreatic mass   Pancreatitis/Pancreatic mass with liver lesions - 3 month hx of intermittent abdominal pain with n/v.  -CT reveals ill defined mass at head of pancreas.  -continues to have pain and tolerate only few sips-- likes the vegetable broth/grits -Will need biopsy: per Dr. Paulita Fujita: best way to approach this is to get IR consultation for consideration of U/S or CT-guided biopsy of one of her liver lesions.  I would not pursue EUS-guided biopsy unless liver biopsy unsuccessful.  If for whatever reason liver biopsy is not possible or feasible, we would have to wait at least a few days prior to consideration of  EUS-guided biopsy given patient's diffuse pancreatitis -GI consult appreciated -IR consult placed -trend LFTs -treat her pancreatitis   Nausea/vomiting/gerd.  -continue zofran -liquid diet -IVF -pain control- have adjusted her morphine -PPI  Hypokalemia -replace in IVF  obesity Body mass index is 39.83 kg/m.   Family Communication/Anticipated D/C date and plan/Code Status   DVT prophylaxis: scd/ambulation Code Status: Full Code.  Family Communication: at bedside Disposition Plan: pending biopsy- will need referral to oncology pending results   Medical Consultants:    GI  IR     Subjective:   Would like to take a shower Says her pain control only last 3 hours  Objective:    Vitals:   07/05/19 0500 07/05/19 0523 07/05/19 0738 07/05/19 1205  BP:  (!) 162/88 (!) 142/106 (!) 150/87  Pulse:  87 83 88  Resp:  18 17 18   Temp:  98 F (36.7 C)  98.3 F (36.8 C)  TempSrc:  Oral  Oral  SpO2:  98% 100% 97%  Weight: 115.3 kg     Height:        Intake/Output Summary (Last 24 hours) at 07/05/2019 1515 Last data filed at 07/05/2019 1131 Gross per 24 hour  Intake 3269.44 ml  Output --  Net 3269.44 ml   Filed Weights   07/03/19 0554 07/04/19 0610 07/05/19 0500  Weight: 116.1 kg 113.9 kg 115.3 kg    Exam: On side of bed, visiting with sister rrr No increased work of breathing, no wheezing, diminished due to size Min LE edema- non-pitting  Data Reviewed:   I have personally reviewed following labs and imaging studies:  Labs: Labs show the following:   Basic Metabolic Panel: Recent Labs  Lab 06/30/19 1432 07/02/19 1448 07/03/19 0522 07/04/19 0135 07/05/19 0459  NA 137 138 138 139 137  K 3.4* 3.5 3.6 3.5 3.3*  CL 97* 97* 100 102 101  CO2 27 27 27 27 26   GLUCOSE 152* 138* 122* 121* 120*  BUN 9 12 12 9  <5*  CREATININE 0.92 0.90 0.87 0.79 0.75  CALCIUM 9.7 9.4 8.9 8.7* 8.6*   GFR Estimated Creatinine Clearance: 93.2 mL/min (by C-G formula  based on SCr of 0.75 mg/dL). Liver Function Tests: Recent Labs  Lab 06/30/19 1432 07/02/19 1448 07/03/19 0522 07/04/19 1107 07/05/19 0459  AST 21 21 22  44* 41  ALT 24 24 24  45* 51*  ALKPHOS 82 85 73 90 97  BILITOT 0.8 0.7 1.0 1.3* 1.0  PROT 8.4* 7.8 6.9 6.4* 6.8  ALBUMIN 3.7 3.5 3.0* 2.7* 2.9*   Recent Labs  Lab 06/30/19 1432 07/02/19 1448  LIPASE 87* 92*   No results for input(s): AMMONIA in the last 168 hours. Coagulation profile No results for input(s): INR, PROTIME in the last 168 hours.  CBC: Recent Labs  Lab 06/30/19 1432 07/02/19 1448 07/03/19 0522 07/04/19 0135  WBC 5.6 5.6 5.5 4.4  HGB 14.0 13.1 11.9* 11.3*  HCT 44.1 42.5 38.4 36.3  MCV 73.0* 74.3* 73.8* 74.7*  PLT 320 297 228 228   Cardiac Enzymes: No results for input(s): CKTOTAL, CKMB, CKMBINDEX, TROPONINI in the last 168 hours. BNP (last 3 results) No results for input(s): PROBNP in the last 8760 hours. CBG: No results for input(s): GLUCAP in the last 168 hours. D-Dimer: No results for input(s): DDIMER in the last 72 hours. Hgb A1c: Recent Labs    07/03/19 0522  HGBA1C 6.5*   Lipid Profile: No results for input(s): CHOL, HDL, LDLCALC, TRIG, CHOLHDL, LDLDIRECT in the last 72 hours. Thyroid function studies: No results for input(s): TSH, T4TOTAL, T3FREE, THYROIDAB in the last 72 hours.  Invalid input(s): FREET3 Anemia work up: No results for input(s): VITAMINB12, FOLATE, FERRITIN, TIBC, IRON, RETICCTPCT in the last 72 hours. Sepsis Labs: Recent Labs  Lab 06/30/19 1432 07/02/19 1448 07/03/19 0522 07/04/19 0135  WBC 5.6 5.6 5.5 4.4    Microbiology Recent Results (from the past 240 hour(s))  SARS CORONAVIRUS 2 (TAT 6-24 HRS) Nasopharyngeal Nasopharyngeal Swab     Status: None   Collection Time: 07/02/19 11:40 PM   Specimen: Nasopharyngeal Swab  Result Value Ref Range Status   SARS Coronavirus 2 NEGATIVE NEGATIVE Final    Comment: (NOTE) SARS-CoV-2 target nucleic acids are NOT  DETECTED. The SARS-CoV-2 RNA is generally detectable in upper and lower respiratory specimens during the acute phase of infection. Negative results do not preclude SARS-CoV-2 infection, do not rule out co-infections with other pathogens, and should not be used as the sole basis for treatment or other patient management decisions. Negative results must be combined with clinical observations, patient history, and epidemiological information. The expected result is Negative. Fact Sheet for Patients: SugarRoll.be Fact Sheet for Healthcare Providers: https://www.woods-mathews.com/ This test is not yet approved or cleared by the [place] FDA and  has been authorized for detection and/or diagnosis of SARS-CoV-2 by FDA under an Emergency Use Authorization (EUA). This EUA will remain  in effect (meaning this test can be used) for the duration of the COVID-19 declaration under Section 56 4(b)(1) of the Act, 21  U.S.C. section 360bbb-3(b)(1), unless the authorization is terminated or revoked sooner. Performed at [facility], [place] [address] [place], [place] [address]     Procedures and diagnostic studies:  No results found.  Medications:   . feeding supplement  1 Container Oral TID BM  . pantoprazole  40 mg Oral Daily   Continuous Infusions: . sodium chloride 100 mL/hr at 07/05/19 1350     LOS: 2 days   Geradine Girt  [facility]   How to contact the [facility] Attending or Consulting provider [place] or covering provider during after hours [place], for this patient?  1. Check the care team in [facility] and look for a) attending/consulting TRH provider listed and b) the [facility] team listed 2. Log into [email or web address] and use [place]'s universal password to access. If you do not have the password, please contact the hospital operator. 3. Locate the [facility] provider you are looking for under [facility] and page to a number that you can  be directly reached. 4. If you still have difficulty reaching the provider, please page the [facility] (Director on Call) for the Hospitalists listed on amion for assistance.  07/05/2019, 3:15 PM

## 2019-07-06 LAB — CBC
HCT: 34.8 % — ABNORMAL LOW (ref 36.0–46.0)
Hemoglobin: 10.8 g/dL — ABNORMAL LOW (ref 12.0–15.0)
MCH: 23.3 pg — ABNORMAL LOW (ref 26.0–34.0)
MCHC: 31 g/dL (ref 30.0–36.0)
MCV: 75 fL — ABNORMAL LOW (ref 80.0–100.0)
Platelets: 199 K/uL (ref 150–400)
RBC: 4.64 MIL/uL (ref 3.87–5.11)
RDW: 14.6 % (ref 11.5–15.5)
WBC: 5.6 K/uL (ref 4.0–10.5)
nRBC: 0 % (ref 0.0–0.2)

## 2019-07-06 LAB — COMPREHENSIVE METABOLIC PANEL WITH GFR
ALT: 41 U/L (ref 0–44)
AST: 29 U/L (ref 15–41)
Albumin: 2.6 g/dL — ABNORMAL LOW (ref 3.5–5.0)
Alkaline Phosphatase: 92 U/L (ref 38–126)
Anion gap: 13 (ref 5–15)
BUN: <5 mg/dL — ABNORMAL LOW (ref 8–23)
CO2: 22 mmol/L (ref 22–32)
Calcium: 8.1 mg/dL — ABNORMAL LOW (ref 8.9–10.3)
Chloride: 100 mmol/L (ref 98–111)
Creatinine, Ser: 0.69 mg/dL (ref 0.44–1.00)
GFR calc Af Amer: >60 mL/min (ref >60)
GFR calc non Af Amer: >60 mL/min (ref >60)
Glucose, Bld: 112 mg/dL — ABNORMAL HIGH (ref 70–99)
Potassium: 3.5 mmol/L (ref 3.5–5.1)
Sodium: 135 mmol/L (ref 135–145)
Total Bilirubin: 1.1 mg/dL (ref 0.3–1.2)
Total Protein: 6.1 g/dL — ABNORMAL LOW (ref 6.5–8.1)

## 2019-07-06 NOTE — Consult Note (Signed)
Chief Complaint: Patient was seen in consultation today for liver mass/biopsy.  Referring Physician(s): Geradine Girt  Supervising Physician: Sandi Mariscal  Patient Status: [facility] - In-pt  History of Present Illness: Rachel Vasquez is a 64 y.o. female with a past medical history of GERD and hiatal hernia. She presented to [facility] ED 07/02/2019 with complaint of emesis. In ED, CT abdomen/pelvis revealed a pancreatic mass along with liver lesions. She was admitted for further management.  CT abdomen/pelvis 07/02/2019: 1. Generalized pancreatic inflammation. There is an ill-defined hypodense suspicious mass at the head of the pancreas measuring 3.4 x 2.2 x 3 cm. 2. Multiple ill-defined hypodense liver masses, consistent with metastatic disease. 3. Hypoenhancement of the splenic vein and superior mesenteric veins near their confluence, potentially occluded. 4. Multiple punctate nodules or small nodes in the right lower quadrant/gutter, cannot exclude metastatic disease 5. Moderate irregular hiatal hernia with some wall thickening, could be correlated with endoscopy 6. Bulky enlargement of the uterus with multiple large masses, increased compared to prior.  IR consulted by Dr. Eliseo Squires for possible image-guided liver mass biopsy. Patient awake and alert sitting in bed. Complains of abdominal pain, rated 5/10 at this time. Denies fever, chills, chest pain, dyspnea, or headache.   Past Medical History:  Diagnosis Date  . GERD (gastroesophageal reflux disease)   . Hiatal hernia   . Liver lesion   . Medical history non-contributory   . Pancreatic mass     History reviewed. No pertinent surgical history.  Allergies: Patient has no known allergies.  Medications: Prior to Admission medications   Medication Sig Start Date End Date Taking? Authorizing Provider  Multiple Vitamins-Minerals (MULTIVITAMIN WITH MINERALS) tablet Take 1 tablet by mouth daily.   Yes [provider]   ondansetron (ZOFRAN-ODT) 4 MG disintegrating tablet Take 4 mg by mouth every 8 (eight) hours as needed for nausea or vomiting.  07/02/19  Yes [provider]  pantoprazole (PROTONIX) 40 MG tablet Take 40 mg by mouth daily. 07/02/19  Yes [provider]     Family History  Problem Relation Age of Onset  . Pancreatic cancer Father     Social History   Socioeconomic History  . Marital status: Single    Spouse name: Not on file  . Number of children: Not on file  . Years of education: Not on file  . Highest education level: Not on file  Occupational History  . Not on file  Tobacco Use  . Smoking status: Never Smoker  . Smokeless tobacco: Never Used  Substance and Sexual Activity  . Alcohol use: Yes  . Drug use: Never  . Sexual activity: Yes  Other Topics Concern  . Not on file  Social History Narrative  . Not on file   Social Determinants of Health   Financial Resource Strain:   . Difficulty of Paying Living Expenses: Not on file  Food Insecurity:   . Worried About Charity fundraiser in the Last Year: Not on file  . Ran Out of Food in the Last Year: Not on file  Transportation Needs:   . Lack of Transportation (Medical): Not on file  . Lack of Transportation (Non-Medical): Not on file  Physical Activity:   . Days of Exercise per Week: Not on file  . Minutes of Exercise per Session: Not on file  Stress:   . Feeling of Stress : Not on file  Social Connections:   . Frequency of Communication with Friends and Family: Not on  file  . Frequency of Social Gatherings with Friends and Family: Not on file  . Attends Religious Services: Not on file  . Active Member of Clubs or Organizations: Not on file  . Attends Archivist Meetings: Not on file  . Marital Status: Not on file     Review of Systems: A 12 point ROS discussed and pertinent positives are indicated in the HPI above.  All other systems are negative.  Review of Systems   Constitutional: Negative for chills and fever.  Respiratory: Negative for shortness of breath and wheezing.   Cardiovascular: Negative for chest pain and palpitations.  Gastrointestinal: Positive for abdominal pain.  Neurological: Negative for headaches.  Psychiatric/Behavioral: Negative for behavioral problems and confusion.    Vital Signs: BP (!) 149/90 (BP Location: Left Arm)   Pulse 86   Temp 98.3 F (36.8 C) (Oral)   Resp 17   Ht 5\' 7"  (1.702 m)   Wt 254 lb 1.6 oz (115.3 kg)   SpO2 98%   BMI 39.80 kg/m   Physical Exam Vitals and nursing note reviewed.  Constitutional:      General: She is not in acute distress.    Appearance: Normal appearance.  Cardiovascular:     Rate and Rhythm: Normal rate and regular rhythm.     Heart sounds: Normal heart sounds. No murmur.  Pulmonary:     Effort: Pulmonary effort is normal. No respiratory distress.     Breath sounds: Normal breath sounds. No wheezing.  Skin:    General: Skin is warm and dry.  Neurological:     Mental Status: She is alert and oriented to person, place, and time.  Psychiatric:        Mood and Affect: Mood normal.        Behavior: Behavior normal.      MD Evaluation Airway: WNL Heart: WNL Abdomen: WNL Chest/ Lungs: WNL ASA  Classification: 3 Mallampati/Airway Score: One   Imaging: CT Abdomen Pelvis W Contrast  Result Date: 07/02/2019 CLINICAL DATA:  Abdominal pain with nausea and vomiting EXAM: CT ABDOMEN AND PELVIS WITH CONTRAST TECHNIQUE: Multidetector CT imaging of the abdomen and pelvis was performed using the standard protocol following bolus administration of intravenous contrast. CONTRAST:  165mL OMNIPAQUE IOHEXOL 300 MG/ML  SOLN COMPARISON:  CT 04/26/2004 FINDINGS: Lower chest: Lung bases demonstrate subsegmental atelectasis at the lingula, right middle lobe and inferior right upper lobe. No acute consolidation or pleural effusion. The heart size is within normal limits. Irregular moderate  hiatal hernia with wall thickening. This is increased compared to prior. Hepatobiliary: Scattered hypodense liver lesions, some of which were present on comparison study. Multiple new ill-defined hypodense liver masses concerning for metastatic disease. Largest suspicious lesion is seen in the right hepatic dome and measures 2.5 cm. Multiple calcified gallstones. No biliary dilatation. Pancreas: Generalized inflammatory changes about the pancreas. Development of mild ductal dilatation. Ill-defined hypodense mass at the head of the pancreas measuring approximately 3.4 cm transverse by 2.2 cm AP by 3 cm craniocaudad. Spleen: Normal in size without focal abnormality. Adrenals/Urinary Tract: Adrenal glands are unremarkable. Kidneys are normal, without renal calculi, focal lesion, or hydronephrosis. Bladder is unremarkable. Stomach/Bowel: No dilated small bowel. Negative for colon wall thickening. Negative appendix. Vascular/Lymphatic: Nonaneurysmal aorta. Hazy appearance of mesenteric root with subcentimeter nodes. Multiple small nodes or punctate nodules in the right lower quadrant/right gutter. Hypoenhancement of the distal splenic vein and superior mesenteric vein. Reproductive: Markedly enlarged uterus with multiple masses, increased compared to  prior. Other: Negative for free air or significant ascites. Anterior abdominal wall laxity. Small supraumbilical fat containing ventral hernia Musculoskeletal: No acute or suspicious osseous abnormality. IMPRESSION: 1. Generalized pancreatic inflammation. There is an ill-defined hypodense suspicious mass at the head of the pancreas measuring 3.4 x 2.2 x 3 cm. 2. Multiple ill-defined hypodense liver masses, consistent with metastatic disease. 3. Hypoenhancement of the splenic vein and superior mesenteric veins near their confluence, potentially occluded. 4. Multiple punctate nodules or small nodes in the right lower quadrant/gutter, cannot exclude metastatic disease 5.  Moderate irregular hiatal hernia with some wall thickening, could be correlated with endoscopy 6. Bulky enlargement of the uterus with multiple large masses, increased compared to prior. Electronically Signed   By: Donavan Foil M.D.   On: 07/02/2019 22:17    Labs:  CBC: Recent Labs    07/02/19 1448 07/03/19 0522 07/04/19 0135 07/06/19 0119  WBC 5.6 5.5 4.4 5.6  HGB 13.1 11.9* 11.3* 10.8*  HCT 42.5 38.4 36.3 34.8*  PLT 297 228 228 199    COAGS: No results for input(s): INR, APTT in the last 8760 hours.  BMP: Recent Labs    07/03/19 0522 07/04/19 0135 07/05/19 0459 07/06/19 0119  NA 138 139 137 135  K 3.6 3.5 3.3* 3.5  CL 100 102 101 100  CO2 27 27 26 22   GLUCOSE 122* 121* 120* 112*  BUN 12 9 <5* <5*  CALCIUM 8.9 8.7* 8.6* 8.1*  CREATININE 0.87 0.79 0.75 0.69  GFRNONAA >60 >60 >60 >60  GFRAA >60 >60 >60 >60    LIVER FUNCTION TESTS: Recent Labs    07/03/19 0522 07/04/19 1107 07/05/19 0459 07/06/19 0119  BILITOT 1.0 1.3* 1.0 1.1  AST 22 44* 41 29  ALT 24 45* 51* 41  ALKPHOS 73 90 97 92  PROT 6.9 6.4* 6.8 6.1*  ALBUMIN 3.0* 2.7* 2.9* 2.6*     Assessment and Plan:  Liver mass. Plan for image-guided liver mass biopsy tenatively for tomorrow in IR. CT chest pending. If reveals lymphadenopathy, possibility that biopsy will be of lymph node per Dr. Pascal Lux (patient will need to be re-consented if this occurs)- patient aware. Patient will be NPO at midnight. Afebrile and WBCs WNL. She does not take blood thinners. INR pending for 0500 tomorrow.  Risks and benefits discussed with the patient including, but not limited to bleeding, infection, damage to adjacent structures or low yield requiring additional tests. All of the patient's questions were answered, patient is agreeable to proceed. Consent signed and in IR control room.   Thank you for this interesting consult.  I greatly enjoyed meeting [facility] and look forward to participating in their care.  A  copy of this report was sent to the requesting provider on this date.  Electronically Signed: Earley Abide, PA-C 07/06/2019, 11:15 AM   I spent a total of 40 Minutes in face to face in clinical consultation, greater than 50% of which was counseling/coordinating care for liver mass/biopsy.

## 2019-07-06 NOTE — Progress Notes (Signed)
Progress Note    Rachel Vasquez  [id number] DOB: August 07, 1955  DOA: 07/02/2019 PCP: Patient, [name] Per    Brief Narrative:     Medical records reviewed and are as summarized below:  Rachel Vasquez is an 64 y.o. female here with 3 months of progressive nausea and vomiting/abdominal pain and weakness.  She was seen an urgent care and then referred to [facility].  Dr. Marye Round: Further work-up with ultrasound and EGD to be performed. I asked her to start Protonix 40 mg twice daily and a Zofran has been given to be taken as needed for nausea in the interim.  Unfortunately patient continued to worsen and presented to the ER for evaluation.  CT scan showed Generalized pancreatic inflammation. There is an ill-defined hypodense suspicious mass at the head of the pancreas measuring 3.4 x 2.2 x 3 cm. Multiple ill-defined hypodense liver masses, consistent with metastatic disease.Bulky enlargement of the uterus with multiple large masses, increased compared to prior. Multiple punctate nodules or small nodes in the right lower quadrant/gutter, cannot exclude metastatic disease   Assessment/Plan:   Principal Problem:   Mass of pancreas Active Problems:   Nausea & vomiting   GERD (gastroesophageal reflux disease)   Hiatal hernia   Liver lesion   Abdominal pain, epigastric   Pancreatic mass   Pancreatitis/Pancreatic mass with liver lesions - 3 month hx of intermittent abdominal pain with n/v.  -CT reveals ill defined mass at head of pancreas.  -continues to have pain and tolerate only few sips-- likes the vegetable broth/grits -Will need biopsy: per Dr. Paulita Fujita: best way to approach this is to get IR consultation for consideration of U/S or CT-guided biopsy of one of her liver lesions.  I would not pursue EUS-guided biopsy unless liver biopsy unsuccessful.  If for whatever reason liver biopsy is not possible or feasible, we would have to wait at least a few days prior to consideration of  EUS-guided biopsy given patient's diffuse pancreatitis -GI consult appreciated -IR consult placed- plan for biopsy in the AM -trend LFTs -treat her pancreatitis with pain control -needs to ambulate -Bowel regimen to avoid constipation  Hypokalemia -replaced  obesity Body mass index is 39.8 kg/m.   Family Communication/Anticipated D/C date and plan/Code Status   DVT prophylaxis: scd/ambulation Code Status: Full Code.  Family Communication: at bedside 1/2 Disposition Plan: pending biopsy- will need referral to oncology pending results   Medical Consultants:    GI  IR     Subjective:   Pain is better controlled with the decrease in time between her pain medication dosages  Objective:    Vitals:   07/06/19 0459 07/06/19 0500 07/06/19 0855 07/06/19 1314  BP: (!) 167/80  (!) 149/90 (!) 153/93  Pulse: 83  86 84  Resp: 17  17 18   Temp: 98.3 F (36.8 C)     TempSrc: Oral     SpO2: 98%   96%  Weight:  115.3 kg    Height:        Intake/Output Summary (Last 24 hours) at 07/06/2019 1445 Last data filed at 07/06/2019 1432 Gross per 24 hour  Intake 1475.49 ml  Output --  Net 1475.49 ml   Filed Weights   07/04/19 0610 07/05/19 0500 07/06/19 0500  Weight: 113.9 kg 115.3 kg 115.3 kg    Exam: In bed, no acute distress Obese abdomen, minimally tender, sluggish bowel sounds Positive lower extremity edema nonpitting Regular rate and rhythm Alert and oriented x3 Data Reviewed:  I have personally reviewed following labs and imaging studies:  Labs: Labs show the following:   Basic Metabolic Panel: Recent Labs  Lab 07/02/19 1448 07/03/19 0522 07/04/19 0135 07/05/19 0459 07/06/19 0119  NA 138 138 139 137 135  K 3.5 3.6 3.5 3.3* 3.5  CL 97* 100 102 101 100  CO2 27 27 27 26 22   GLUCOSE 138* 122* 121* 120* 112*  BUN 12 12 9  <5* <5*  CREATININE 0.90 0.87 0.79 0.75 0.69  CALCIUM 9.4 8.9 8.7* 8.6* 8.1*   GFR Estimated Creatinine Clearance: 93.2 mL/min  (by C-G formula based on SCr of 0.69 mg/dL). Liver Function Tests: Recent Labs  Lab 07/02/19 1448 07/03/19 0522 07/04/19 1107 07/05/19 0459 07/06/19 0119  AST 21 22 44* 41 29  ALT 24 24 45* 51* 41  ALKPHOS 85 73 90 97 92  BILITOT 0.7 1.0 1.3* 1.0 1.1  PROT 7.8 6.9 6.4* 6.8 6.1*  ALBUMIN 3.5 3.0* 2.7* 2.9* 2.6*   Recent Labs  Lab 06/30/19 1432 07/02/19 1448  LIPASE 87* 92*   No results for input(s): AMMONIA in the last 168 hours. Coagulation profile No results for input(s): INR, PROTIME in the last 168 hours.  CBC: Recent Labs  Lab 06/30/19 1432 07/02/19 1448 07/03/19 0522 07/04/19 0135 07/06/19 0119  WBC 5.6 5.6 5.5 4.4 5.6  HGB 14.0 13.1 11.9* 11.3* 10.8*  HCT 44.1 42.5 38.4 36.3 34.8*  MCV 73.0* 74.3* 73.8* 74.7* 75.0*  PLT 320 297 228 228 199   Cardiac Enzymes: No results for input(s): CKTOTAL, CKMB, CKMBINDEX, TROPONINI in the last 168 hours. BNP (last 3 results) No results for input(s): PROBNP in the last 8760 hours. CBG: No results for input(s): GLUCAP in the last 168 hours. D-Dimer: No results for input(s): DDIMER in the last 72 hours. Hgb A1c: No results for input(s): HGBA1C in the last 72 hours. Lipid Profile: No results for input(s): CHOL, HDL, LDLCALC, TRIG, CHOLHDL, LDLDIRECT in the last 72 hours. Thyroid function studies: No results for input(s): TSH, T4TOTAL, T3FREE, THYROIDAB in the last 72 hours.  Invalid input(s): FREET3 Anemia work up: No results for input(s): VITAMINB12, FOLATE, FERRITIN, TIBC, IRON, RETICCTPCT in the last 72 hours. Sepsis Labs: Recent Labs  Lab 07/02/19 1448 07/03/19 0522 07/04/19 0135 07/06/19 0119  WBC 5.6 5.5 4.4 5.6    Microbiology Recent Results (from the past 240 hour(s))  SARS CORONAVIRUS 2 (TAT 6-24 HRS) Nasopharyngeal Nasopharyngeal Swab     Status: None   Collection Time: 07/02/19 11:40 PM   Specimen: Nasopharyngeal Swab  Result Value Ref Range Status   SARS Coronavirus 2 NEGATIVE NEGATIVE Final      Comment: (NOTE) SARS-CoV-2 target nucleic acids are NOT DETECTED. The SARS-CoV-2 RNA is generally detectable in upper and lower respiratory specimens during the acute phase of infection. Negative results do not preclude SARS-CoV-2 infection, do not rule out co-infections with other pathogens, and should not be used as the sole basis for treatment or other patient management decisions. Negative results must be combined with clinical observations, patient history, and epidemiological information. The expected result is Negative. Fact Sheet for Patients: SugarRoll.be Fact Sheet for Healthcare Providers: https://www.woods-mathews.com/ This test is not yet approved or cleared by the [place] FDA and  has been authorized for detection and/or diagnosis of SARS-CoV-2 by FDA under an Emergency Use Authorization (EUA). This EUA will remain  in effect (meaning this test can be used) for the duration of the COVID-19 declaration under Section 56 4(b)(1) of the  Act, 21 U.S.C. section 360bbb-3(b)(1), unless the authorization is terminated or revoked sooner. Performed at [facility], [place] [address] [place], [place] [address]     Procedures and diagnostic studies:  No results found.  Medications:   . feeding supplement  1 Container Oral TID BM  . pantoprazole  40 mg Oral Daily   Continuous Infusions:    LOS: 3 days   Geradine Girt  [facility]   How to contact the [facility] Attending or Consulting provider [place] or covering provider during after hours [place], for this patient?  1. Check the care team in [facility] and look for a) attending/consulting [facility] provider listed and b) the [facility] team listed 2. Log into [email or web address] and use [place]'s universal password to access. If you do not have the password, please contact the hospital operator. 3. Locate the [facility] provider you are looking for under [facility] and page to a number  that you can be directly reached. 4. If you still have difficulty reaching the provider, please page the [facility] (Director on Call) for the Hospitalists listed on amion for assistance.  07/06/2019, 2:45 PM

## 2019-07-06 NOTE — Progress Notes (Signed)
Pt alert and oriented x4. Pt continues to complain of persistent gas with loss of appetite and abdominal pain. Pt only eating less than 25% of meals. Pain somewhat relieved with pain meds. Pt states would like medication for gas more frequent, provider and nurse to be aware. Pt also has light vaginal spotting on bed, she states" it happens at times." Will continue to monitor, vitals WDL.

## 2019-07-07 ENCOUNTER — Inpatient Hospital Stay (HOSPITAL_COMMUNITY): Payer: BC Managed Care – PPO

## 2019-07-07 ENCOUNTER — Other Ambulatory Visit: Payer: Self-pay

## 2019-07-07 DIAGNOSIS — K769 Liver disease, unspecified: Secondary | ICD-10-CM

## 2019-07-07 DIAGNOSIS — R1013 Epigastric pain: Secondary | ICD-10-CM

## 2019-07-07 LAB — PROTIME-INR
INR: 1.2 (ref 0.8–1.2)
Prothrombin Time: 15.1 seconds (ref 11.4–15.2)

## 2019-07-07 MED ORDER — LIDOCAINE-EPINEPHRINE 1 %-1:100000 IJ SOLN
INTRAMUSCULAR | Status: AC
  Filled 2019-07-07: qty 1

## 2019-07-07 MED ORDER — SODIUM CHLORIDE 0.9 % IV SOLN
INTRAVENOUS | Status: AC | PRN
  Administered 2019-07-07: 10 mL/h via INTRAVENOUS

## 2019-07-07 MED ORDER — GELATIN ABSORBABLE 12-7 MM EX MISC
CUTANEOUS | Status: AC
  Filled 2019-07-07: qty 1

## 2019-07-07 MED ORDER — BISACODYL 10 MG RE SUPP
10.0000 mg | Freq: Every day | RECTAL | Status: DC | PRN
  Administered 2019-07-07: 10 mg via RECTAL
  Filled 2019-07-07: qty 1

## 2019-07-07 MED ORDER — FENTANYL CITRATE (PF) 100 MCG/2ML IJ SOLN
INTRAMUSCULAR | Status: AC
  Filled 2019-07-07: qty 2

## 2019-07-07 MED ORDER — MIDAZOLAM HCL 2 MG/2ML IJ SOLN
INTRAMUSCULAR | Status: AC
  Filled 2019-07-07: qty 2

## 2019-07-07 MED ORDER — FENTANYL CITRATE (PF) 100 MCG/2ML IJ SOLN
INTRAMUSCULAR | Status: AC | PRN
  Administered 2019-07-07 (×2): 25 ug via INTRAVENOUS
  Administered 2019-07-07: 50 ug via INTRAVENOUS

## 2019-07-07 MED ORDER — IOHEXOL 300 MG/ML  SOLN
75.0000 mL | Freq: Once | INTRAMUSCULAR | Status: AC | PRN
  Administered 2019-07-07: 75 mL via INTRAVENOUS

## 2019-07-07 MED ORDER — MIDAZOLAM HCL 2 MG/2ML IJ SOLN
INTRAMUSCULAR | Status: AC | PRN
  Administered 2019-07-07: 0.5 mg via INTRAVENOUS
  Administered 2019-07-07: 1 mg via INTRAVENOUS
  Administered 2019-07-07: 0.5 mg via INTRAVENOUS

## 2019-07-07 NOTE — Progress Notes (Signed)
Pt is refusing to take PO medications or Breeze supplements. Pt is able to tolerate eating a fruit tray. Will continue to attempt with her medications. MD made aware.

## 2019-07-07 NOTE — Procedures (Signed)
Pre Procedure Dx: Liver lesion Post Procedural Dx: Same  Technically successful US guided biopsy of indeterminate lesion within the dome of the liver.  EBL: None No immediate complications.   Ronny Bacon, MD Pager #: [phone]

## 2019-07-07 NOTE — Progress Notes (Signed)
Progress Note    Rachel Vasquez  [id number] DOB: Nov 05, 1955  DOA: 07/02/2019 PCP: Patient, [name] Per    Brief Narrative:     Medical records reviewed and are as summarized below:  Rachel Vasquez is an 64 y.o. female here with 3 months of progressive nausea and vomiting/abdominal pain and weakness.  She was seen an urgent care and then referred to [facility].  Dr. Marye Round: Further work-up with ultrasound and EGD to be performed. I asked her to start Protonix 40 mg twice daily and a Zofran has been given to be taken as needed for nausea in the interim.  Unfortunately patient continued to worsen and presented to the ER for evaluation.  CT scan showed Generalized pancreatic inflammation. There is an ill-defined hypodense suspicious mass at the head of the pancreas measuring 3.4 x 2.2 x 3 cm. Multiple ill-defined hypodense liver masses, consistent with metastatic disease.Bulky enlargement of the uterus with multiple large masses, increased compared to prior. Multiple punctate nodules or small nodes in the right lower quadrant/gutter, cannot exclude metastatic disease   Assessment/Plan:   Principal Problem:   Mass of pancreas Active Problems:   Nausea & vomiting   GERD (gastroesophageal reflux disease)   Hiatal hernia   Liver lesion   Abdominal pain, epigastric   Pancreatic mass   Pancreatitis/Pancreatic mass with liver lesions - 3 month hx of intermittent abdominal pain with n/v.  -CT reveals ill defined mass at head of pancreas.  -per Dr. Paulita Fujita: best way to approach for biopsy is to get IR consultation for consideration of U/S or CT-guided biopsy of one of her liver lesions.  I would not pursue EUS-guided biopsy unless liver biopsy unsuccessful.  If for whatever reason liver biopsy is not possible or feasible, we would have to wait at least a few days prior to consideration of EUS-guided biopsy given patient's diffuse pancreatitis -GI consult appreciated -IR consult appreciated--  biopsy done 1/4 -treat her pancreatitis with pain control -needs to ambulate -Bowel regimen to avoid constipation  Hypokalemia -replaced  obesity Body mass index is 39.14 kg/m.   Family Communication/Anticipated D/C date and plan/Code Status   DVT prophylaxis: scd/ambulation Code Status: Full Code.  Family Communication: at bedside 1/2 Disposition Plan: pending biopsy- will need referral to oncology pending results   Medical Consultants:    GI  IR     Subjective:   Had biopsy this AM   Objective:    Vitals:   07/07/19 1100 07/07/19 1105 07/07/19 1117 07/07/19 1230  BP: 132/72 131/75 (!) 143/75 (!) 162/92  Pulse: 76 80 77 83  Resp: 19 20 18 18   Temp:    97.6 F (36.4 C)  TempSrc:    Oral  SpO2: 95% 96% 98% 99%  Weight:      Height:        Intake/Output Summary (Last 24 hours) at 07/07/2019 1634 Last data filed at 07/07/2019 0800 Gross per 24 hour  Intake 0 ml  Output --  Net 0 ml   Filed Weights   07/05/19 0500 07/06/19 0500 07/07/19 0540  Weight: 115.3 kg 115.3 kg 113.4 kg    Exam: In bed, laying on side NAD +BS, soft, NT, obese Min LE edema   Data Reviewed:   I have personally reviewed following labs and imaging studies:  Labs: Labs show the following:   Basic Metabolic Panel: Recent Labs  Lab 07/02/19 1448 07/03/19 0522 07/04/19 0135 07/05/19 0459 07/06/19 0119  NA 138 138 139  137 135  K 3.5 3.6 3.5 3.3* 3.5  CL 97* 100 102 101 100  CO2 27 27 27 26 22   GLUCOSE 138* 122* 121* 120* 112*  BUN 12 12 9  <5* <5*  CREATININE 0.90 0.87 0.79 0.75 0.69  CALCIUM 9.4 8.9 8.7* 8.6* 8.1*   GFR Estimated Creatinine Clearance: 92.3 mL/min (by C-G formula based on SCr of 0.69 mg/dL). Liver Function Tests: Recent Labs  Lab 07/02/19 1448 07/03/19 0522 07/04/19 1107 07/05/19 0459 07/06/19 0119  AST 21 22 44* 41 29  ALT 24 24 45* 51* 41  ALKPHOS 85 73 90 97 92  BILITOT 0.7 1.0 1.3* 1.0 1.1  PROT 7.8 6.9 6.4* 6.8 6.1*  ALBUMIN 3.5  3.0* 2.7* 2.9* 2.6*   Recent Labs  Lab 07/02/19 1448  LIPASE 92*   No results for input(s): AMMONIA in the last 168 hours. Coagulation profile Recent Labs  Lab 07/07/19 0050  INR 1.2    CBC: Recent Labs  Lab 07/02/19 1448 07/03/19 0522 07/04/19 0135 07/06/19 0119  WBC 5.6 5.5 4.4 5.6  HGB 13.1 11.9* 11.3* 10.8*  HCT 42.5 38.4 36.3 34.8*  MCV 74.3* 73.8* 74.7* 75.0*  PLT 297 228 228 199   Cardiac Enzymes: No results for input(s): CKTOTAL, CKMB, CKMBINDEX, TROPONINI in the last 168 hours. BNP (last 3 results) No results for input(s): PROBNP in the last 8760 hours. CBG: No results for input(s): GLUCAP in the last 168 hours. D-Dimer: No results for input(s): DDIMER in the last 72 hours. Hgb A1c: No results for input(s): HGBA1C in the last 72 hours. Lipid Profile: No results for input(s): CHOL, HDL, LDLCALC, TRIG, CHOLHDL, LDLDIRECT in the last 72 hours. Thyroid function studies: No results for input(s): TSH, T4TOTAL, T3FREE, THYROIDAB in the last 72 hours.  Invalid input(s): FREET3 Anemia work up: No results for input(s): VITAMINB12, FOLATE, FERRITIN, TIBC, IRON, RETICCTPCT in the last 72 hours. Sepsis Labs: Recent Labs  Lab 07/02/19 1448 07/03/19 0522 07/04/19 0135 07/06/19 0119  WBC 5.6 5.5 4.4 5.6    Microbiology Recent Results (from the past 240 hour(s))  SARS CORONAVIRUS 2 (TAT 6-24 HRS) Nasopharyngeal Nasopharyngeal Swab     Status: None   Collection Time: 07/02/19 11:40 PM   Specimen: Nasopharyngeal Swab  Result Value Ref Range Status   SARS Coronavirus 2 NEGATIVE NEGATIVE Final    Comment: (NOTE) SARS-CoV-2 target nucleic acids are NOT DETECTED. The SARS-CoV-2 RNA is generally detectable in upper and lower respiratory specimens during the acute phase of infection. Negative results do not preclude SARS-CoV-2 infection, do not rule out co-infections with other pathogens, and should not be used as the sole basis for treatment or other patient  management decisions. Negative results must be combined with clinical observations, patient history, and epidemiological information. The expected result is Negative. Fact Sheet for Patients: SugarRoll.be Fact Sheet for Healthcare Providers: https://www.woods-mathews.com/ This test is not yet approved or cleared by the [place] FDA and  has been authorized for detection and/or diagnosis of SARS-CoV-2 by FDA under an Emergency Use Authorization (EUA). This EUA will remain  in effect (meaning this test can be used) for the duration of the COVID-19 declaration under Section 56 4(b)(1) of the Act, 21 U.S.C. section 360bbb-3(b)(1), unless the authorization is terminated or revoked sooner. Performed at [facility], [place] [address], [place], [place] [address]     Procedures and diagnostic studies:  CT CHEST W CONTRAST  Result Date: 07/07/2019 CLINICAL DATA:  Liver mass.  Concern  for hepatic metastasis EXAM: CT CHEST WITH CONTRAST TECHNIQUE: Multidetector CT imaging of the chest was performed during intravenous contrast administration. CONTRAST:  30mL OMNIPAQUE IOHEXOL 300 MG/ML  SOLN COMPARISON:  CT 07/02/2019 FINDINGS: Cardiovascular: No significant vascular findings. Normal heart size. No pericardial effusion. Mediastinum/Nodes: No axillary supraclavicular adenopathy. Multiple nodules within the thyroid gland measures 12 mm or less. This is not meet the size requirement for follow-up imaging. No mediastinal lymphadenopathy. Moderate to large hiatal hernia posterior to the LEFT heart. Lungs/Pleura: Linear atelectasis in the lingula and medial RIGHT middle lobe. No suspicious nodularity. Upper Abdomen: Several low-density suspicious lesions in the liver again demonstrated. Inflammation potential mass in the pancreatic head again noted. No interval change from 07/02/2019. Musculoskeletal: No aggressive osseous lesion. IMPRESSION: 1. No evidence of  thoracic metastasis. 2. Low-density lesions in the liver concerning for metastasis. See recent abdomen CT 07/02/2019. 3. Inflammation head of pancreas with concern for underlying neoplasm. Recommend appropriate tumor biomarkers. Electronically Signed   By: Suzy Bouchard M.D.   On: 07/07/2019 08:23   US BIOPSY (LIVER)  Result Date: 07/07/2019 INDICATION: Concern for metastatic pancreatic cancer. Please perform ultrasound-guided liver lesion biopsy for tissue diagnostic purposes. EXAM: ULTRASOUND GUIDED LIVER LESION BIOPSY COMPARISON:  CT abdomen and pelvis-07/02/2019 MEDICATIONS: None ANESTHESIA/SEDATION: Fentanyl 100 mcg IV; Versed 2 mg IV Total Moderate Sedation time:  17 Minutes. The patient's level of consciousness and vital signs were monitored continuously by radiology nursing throughout the procedure under my direct supervision. COMPLICATIONS: None immediate. PROCEDURE: Informed written consent was obtained from the patient after a discussion of the risks, benefits and alternatives to treatment. The patient understands and consents the procedure. A timeout was performed prior to the initiation of the procedure. Ultrasound scanning was performed of the right upper abdominal quadrant demonstrates an ill-defined approximately 2.9 x 3.1 x 2.2 cm hypoechoic nodule/mass within the dome of the right lobe of the liver correlating with the indeterminate mass seen on preceding abdominal CT image 15, series 3. Note, imaging was degraded secondary to patient body habitus well as well as the lesion being located within the dome of the liver with associated interposition of lung. The procedure was planned. The right upper abdominal quadrant was prepped and draped in the usual sterile fashion. The overlying soft tissues were anesthetized with 1% lidocaine with epinephrine. A 17 gauge, 6.8 cm co-axial needle was advanced into a peripheral aspect of the lesion. This was followed by 6 core biopsies with an 18 gauge core  device under direct ultrasound guidance. The coaxial needle tract was embolized with a small amount of Gel-Foam slurry and superficial hemostasis was obtained with manual compression. Post procedural scanning was negative for definitive area of hemorrhage or additional complication. A dressing was placed. The patient tolerated the procedure well without immediate post procedural complication. IMPRESSION: Technically challenging though ultimately successful ultrasound guided core needle biopsy of indeterminate mass within the dome of the right lobe of the liver. Electronically Signed   By: Sandi Mariscal M.D.   On: 07/07/2019 13:34    Medications:    feeding supplement  1 Container Oral TID BM   fentaNYL       gelatin adsorbable       lidocaine-EPINEPHrine       midazolam       pantoprazole  40 mg Oral Daily   Continuous Infusions:    LOS: 4 days   Geradine Girt  [facility]   How to contact the [facility] Attending or Consulting  provider [place] or covering provider during after hours [place], for this patient?  1. Check the care team in [facility] and look for a) attending/consulting TRH provider listed and b) the [facility] team listed 2. Log into [email or web address] and use [place]'s universal password to access. If you do not have the password, please contact the hospital operator. 3. Locate the [facility] provider you are looking for under [facility] and page to a number that you can be directly reached. 4. If you still have difficulty reaching the provider, please page the [facility] (Director on Call) for the Hospitalists listed on amion for assistance.  07/07/2019, 4:34 PM

## 2019-07-08 ENCOUNTER — Inpatient Hospital Stay (HOSPITAL_COMMUNITY): Payer: BC Managed Care – PPO

## 2019-07-08 LAB — CBC
HCT: 37.6 % (ref 36.0–46.0)
Hemoglobin: 11.6 g/dL — ABNORMAL LOW (ref 12.0–15.0)
MCH: 22.8 pg — ABNORMAL LOW (ref 26.0–34.0)
MCHC: 30.9 g/dL (ref 30.0–36.0)
MCV: 74 fL — ABNORMAL LOW (ref 80.0–100.0)
Platelets: 250 10*3/uL (ref 150–400)
RBC: 5.08 MIL/uL (ref 3.87–5.11)
RDW: 14.4 % (ref 11.5–15.5)
WBC: 6.2 10*3/uL (ref 4.0–10.5)
nRBC: 0 % (ref 0.0–0.2)

## 2019-07-08 LAB — BASIC METABOLIC PANEL
Anion gap: 16 — ABNORMAL HIGH (ref 5–15)
BUN: <5 mg/dL — ABNORMAL LOW (ref 8–23)
CO2: 23 mmol/L (ref 22–32)
Calcium: 9.1 mg/dL (ref 8.9–10.3)
Chloride: 99 mmol/L (ref 98–111)
Creatinine, Ser: 0.74 mg/dL (ref 0.44–1.00)
GFR calc Af Amer: >60 mL/min (ref >60)
GFR calc non Af Amer: >60 mL/min (ref >60)
Glucose, Bld: 118 mg/dL — ABNORMAL HIGH (ref 70–99)
Potassium: 3.2 mmol/L — ABNORMAL LOW (ref 3.5–5.1)
Sodium: 138 mmol/L (ref 135–145)

## 2019-07-08 MED ORDER — POTASSIUM CHLORIDE 10 MEQ/100ML IV SOLN
10.0000 meq | INTRAVENOUS | Status: AC
  Administered 2019-07-08 (×3): 10 meq via INTRAVENOUS
  Filled 2019-07-08 (×3): qty 100

## 2019-07-08 MED ORDER — ENOXAPARIN SODIUM 40 MG/0.4ML ~~LOC~~ SOLN
40.0000 mg | SUBCUTANEOUS | Status: AC
  Filled 2019-07-08: qty 0.4

## 2019-07-08 MED ORDER — PANTOPRAZOLE SODIUM 40 MG IV SOLR
40.0000 mg | Freq: Two times a day (BID) | INTRAVENOUS | Status: DC
  Administered 2019-07-08 – 2019-07-14 (×13): 40 mg via INTRAVENOUS
  Filled 2019-07-08 (×13): qty 40

## 2019-07-08 MED ORDER — SODIUM CHLORIDE 0.9 % IV SOLN: INTRAVENOUS | Status: DC

## 2019-07-08 NOTE — Progress Notes (Addendum)
Progress Note    Rachel Vasquez  [id number] DOB: Mar 04, 1956  DOA: 07/02/2019 PCP: Patient, [name] Per    Brief Narrative:     Medical records reviewed and are as summarized below:  Rachel Vasquez is an 64 y.o. female here with 3 months of progressive nausea and vomiting/abdominal pain and weakness.  She was seen an urgent care and then referred to [facility].  Dr. Marye Round: Further work-up with ultrasound and EGD to be performed. I asked her to start Protonix 40 mg twice daily and a Zofran has been given to be taken as needed for nausea in the interim.  Unfortunately patient continued to worsen and presented to the ER for evaluation.  CT scan showed Generalized pancreatic inflammation. There is an ill-defined hypodense suspicious mass at the head of the pancreas measuring 3.4 x 2.2 x 3 cm. Multiple ill-defined hypodense liver masses, consistent with metastatic disease.Bulky enlargement of the uterus with multiple large masses, increased compared to prior. Multiple punctate nodules or small nodes in the right lower quadrant/gutter, cannot exclude metastatic disease.  She has been slow to improve.  Biopsy done 1/4 and pathology results pending.     Assessment/Plan:   Principal Problem:   Mass of pancreas Active Problems:   Nausea & vomiting   GERD (gastroesophageal reflux disease)   Hiatal hernia   Liver lesion   Abdominal pain, epigastric   Pancreatic mass   Pancreatitis/Pancreatic mass with liver lesions - 3 month hx of intermittent abdominal pain with n/v.  -CT reveals ill defined mass at head of pancreas.  -per Dr. Paulita Fujita: best way to approach for biopsy is to get IR consultation for consideration of U/S or CT-guided biopsy of one of her liver lesions.  I would not pursue EUS-guided biopsy unless liver biopsy unsuccessful.  If for whatever reason liver biopsy is not possible or feasible, we would have to wait at least a few days prior to consideration of EUS-guided biopsy given  patient's diffuse pancreatitis -GI consult appreciated -IR consult appreciated-- biopsy done 1/4 -treat her pancreatitis with pain control (pain is well out of proportion to physical exam) -needs to ambulate -Bowel regimen to avoid constipation -will check dg abd to r/o ileus as bowel sounds diminished -add gentle IVF -may need to consider NG tube and post pyloic feeds as her eating is poor -IV PPI  Hypokalemia -replaced  Constipation -bowel regimen -patient intermittently refusing medications  Back pain -kpad  obesity Body mass index is 38.12 kg/m.   Family Communication/Anticipated D/C date and plan/Code Status   DVT prophylaxis: scd/ambulation (added lovenox as patient not ambulating as instructed) Code Status: Full Code.  Family Communication: at bedside 1/2 Disposition Plan: pending biopsy- will need referral to oncology pending results   Medical Consultants:    GI  IR     Subjective:   C/o of intense pain (not in proportion to physical exam)   Objective:    Vitals:   07/07/19 1759 07/08/19 0057 07/08/19 0539 07/08/19 1146  BP: 130/77 126/62 128/65 127/72  Pulse: 87 84 78 77  Resp: 16 17 16 18   Temp: 97.7 F (36.5 C) 97.9 F (36.6 C) 98.1 F (36.7 C) 98 F (36.7 C)  TempSrc: Oral Oral Oral Oral  SpO2: 93% 95% 97% 96%  Weight:   110.4 kg   Height:       No intake or output data in the 24 hours ending 07/08/19 1404 Filed Weights   07/06/19 0500 07/07/19 0540 07/08/19  [id number]  Weight: 115.3 kg 113.4 kg 110.4 kg    Exam: In bed, NAD rrr No increased work of breathing Min LE edema Diminished bowel sounds but even with deep palpation patient had no tenderness   Data Reviewed:   I have personally reviewed following labs and imaging studies:  Labs: Labs show the following:   Basic Metabolic Panel: Recent Labs  Lab 07/03/19 0522 07/04/19 0135 07/05/19 0459 07/06/19 0119 07/08/19 0548  NA 138 139 137 135 138  K 3.6 3.5 3.3* 3.5  3.2*  CL 100 102 101 100 99  CO2 27 27 26 22 23   GLUCOSE 122* 121* 120* 112* 118*  BUN 12 9 <5* <5* <5*  CREATININE 0.87 0.79 0.75 0.69 0.74  CALCIUM 8.9 8.7* 8.6* 8.1* 9.1   GFR Estimated Creatinine Clearance: 91 mL/min (by C-G formula based on SCr of 0.74 mg/dL). Liver Function Tests: Recent Labs  Lab 07/02/19 1448 07/03/19 0522 07/04/19 1107 07/05/19 0459 07/06/19 0119  AST 21 22 44* 41 29  ALT 24 24 45* 51* 41  ALKPHOS 85 73 90 97 92  BILITOT 0.7 1.0 1.3* 1.0 1.1  PROT 7.8 6.9 6.4* 6.8 6.1*  ALBUMIN 3.5 3.0* 2.7* 2.9* 2.6*   Recent Labs  Lab 07/02/19 1448  LIPASE 92*   No results for input(s): AMMONIA in the last 168 hours. Coagulation profile Recent Labs  Lab 07/07/19 0050  INR 1.2    CBC: Recent Labs  Lab 07/02/19 1448 07/03/19 0522 07/04/19 0135 07/06/19 0119 07/08/19 0548  WBC 5.6 5.5 4.4 5.6 6.2  HGB 13.1 11.9* 11.3* 10.8* 11.6*  HCT 42.5 38.4 36.3 34.8* 37.6  MCV 74.3* 73.8* 74.7* 75.0* 74.0*  PLT 297 228 228 199 250   Cardiac Enzymes: No results for input(s): CKTOTAL, CKMB, CKMBINDEX, TROPONINI in the last 168 hours. BNP (last 3 results) No results for input(s): PROBNP in the last 8760 hours. CBG: No results for input(s): GLUCAP in the last 168 hours. D-Dimer: No results for input(s): DDIMER in the last 72 hours. Hgb A1c: No results for input(s): HGBA1C in the last 72 hours. Lipid Profile: No results for input(s): CHOL, HDL, LDLCALC, TRIG, CHOLHDL, LDLDIRECT in the last 72 hours. Thyroid function studies: No results for input(s): TSH, T4TOTAL, T3FREE, THYROIDAB in the last 72 hours.  Invalid input(s): FREET3 Anemia work up: No results for input(s): VITAMINB12, FOLATE, FERRITIN, TIBC, IRON, RETICCTPCT in the last 72 hours. Sepsis Labs: Recent Labs  Lab 07/03/19 0522 07/04/19 0135 07/06/19 0119 07/08/19 0548  WBC 5.5 4.4 5.6 6.2    Microbiology Recent Results (from the past 240 hour(s))  SARS CORONAVIRUS 2 (TAT 6-24 HRS)  Nasopharyngeal Nasopharyngeal Swab     Status: None   Collection Time: 07/02/19 11:40 PM   Specimen: Nasopharyngeal Swab  Result Value Ref Range Status   SARS Coronavirus 2 NEGATIVE NEGATIVE Final    Comment: (NOTE) SARS-CoV-2 target nucleic acids are NOT DETECTED. The SARS-CoV-2 RNA is generally detectable in upper and lower respiratory specimens during the acute phase of infection. Negative results do not preclude SARS-CoV-2 infection, do not rule out co-infections with other pathogens, and should not be used as the sole basis for treatment or other patient management decisions. Negative results must be combined with clinical observations, patient history, and epidemiological information. The expected result is Negative. Fact Sheet for Patients: SugarRoll.be Fact Sheet for Healthcare Providers: https://www.woods-mathews.com/ This test is not yet approved or cleared by the [place] FDA and  has been authorized for  detection and/or diagnosis of SARS-CoV-2 by FDA under an Emergency Use Authorization (EUA). This EUA will remain  in effect (meaning this test can be used) for the duration of the COVID-19 declaration under Section 56 4(b)(1) of the Act, 21 U.S.C. section 360bbb-3(b)(1), unless the authorization is terminated or revoked sooner. Performed at [facility], [place] [address] [place], [place] [address]     Procedures and diagnostic studies:  CT CHEST W CONTRAST  Result Date: 07/07/2019 CLINICAL DATA:  Liver mass.  Concern for hepatic metastasis EXAM: CT CHEST WITH CONTRAST TECHNIQUE: Multidetector CT imaging of the chest was performed during intravenous contrast administration. CONTRAST:  80mL OMNIPAQUE IOHEXOL 300 MG/ML  SOLN COMPARISON:  CT 07/02/2019 FINDINGS: Cardiovascular: No significant vascular findings. Normal heart size. No pericardial effusion. Mediastinum/Nodes: No axillary supraclavicular adenopathy. Multiple  nodules within the thyroid gland measures 12 mm or less. This is not meet the size requirement for follow-up imaging. No mediastinal lymphadenopathy. Moderate to large hiatal hernia posterior to the LEFT heart. Lungs/Pleura: Linear atelectasis in the lingula and medial RIGHT middle lobe. No suspicious nodularity. Upper Abdomen: Several low-density suspicious lesions in the liver again demonstrated. Inflammation potential mass in the pancreatic head again noted. No interval change from 07/02/2019. Musculoskeletal: No aggressive osseous lesion. IMPRESSION: 1. No evidence of thoracic metastasis. 2. Low-density lesions in the liver concerning for metastasis. See recent abdomen CT 07/02/2019. 3. Inflammation head of pancreas with concern for underlying neoplasm. Recommend appropriate tumor biomarkers. Electronically Signed   By: Suzy Bouchard M.D.   On: 07/07/2019 08:23   US BIOPSY (LIVER)  Result Date: 07/07/2019 INDICATION: Concern for metastatic pancreatic cancer. Please perform ultrasound-guided liver lesion biopsy for tissue diagnostic purposes. EXAM: ULTRASOUND GUIDED LIVER LESION BIOPSY COMPARISON:  CT abdomen and pelvis-07/02/2019 MEDICATIONS: None ANESTHESIA/SEDATION: Fentanyl 100 mcg IV; Versed 2 mg IV Total Moderate Sedation time:  17 Minutes. The patient's level of consciousness and vital signs were monitored continuously by radiology nursing throughout the procedure under my direct supervision. COMPLICATIONS: None immediate. PROCEDURE: Informed written consent was obtained from the patient after a discussion of the risks, benefits and alternatives to treatment. The patient understands and consents the procedure. A timeout was performed prior to the initiation of the procedure. Ultrasound scanning was performed of the right upper abdominal quadrant demonstrates an ill-defined approximately 2.9 x 3.1 x 2.2 cm hypoechoic nodule/mass within the dome of the right lobe of the liver correlating with the  indeterminate mass seen on preceding abdominal CT image 15, series 3. Note, imaging was degraded secondary to patient body habitus well as well as the lesion being located within the dome of the liver with associated interposition of lung. The procedure was planned. The right upper abdominal quadrant was prepped and draped in the usual sterile fashion. The overlying soft tissues were anesthetized with 1% lidocaine with epinephrine. A 17 gauge, 6.8 cm co-axial needle was advanced into a peripheral aspect of the lesion. This was followed by 6 core biopsies with an 18 gauge core device under direct ultrasound guidance. The coaxial needle tract was embolized with a small amount of Gel-Foam slurry and superficial hemostasis was obtained with manual compression. Post procedural scanning was negative for definitive area of hemorrhage or additional complication. A dressing was placed. The patient tolerated the procedure well without immediate post procedural complication. IMPRESSION: Technically challenging though ultimately successful ultrasound guided core needle biopsy of indeterminate mass within the dome of the right lobe of the liver. Electronically Signed   By:  Sandi Mariscal M.D.   On: 07/07/2019 13:34    Medications:   . feeding supplement  1 Container Oral TID BM  . pantoprazole (PROTONIX) IV  40 mg Intravenous Q12H   Continuous Infusions: . sodium chloride       LOS: 5 days   Geradine Girt  [facility]   How to contact the [facility] Attending or Consulting provider [place] or covering provider during after hours [place], for this patient?  1. Check the care team in [facility] and look for a) attending/consulting TRH provider listed and b) the [facility] team listed 2. Log into [email or web address] and use [place]'s universal password to access. If you do not have the password, please contact the hospital operator. 3. Locate the [facility] provider you are looking for under [facility] and page to a number that  you can be directly reached. 4. If you still have difficulty reaching the provider, please page the [facility] (Director on Call) for the Hospitalists listed on amion for assistance.  07/08/2019, 2:04 PM

## 2019-07-09 DIAGNOSIS — K859 Acute pancreatitis without necrosis or infection, unspecified: Secondary | ICD-10-CM

## 2019-07-09 DIAGNOSIS — D509 Iron deficiency anemia, unspecified: Secondary | ICD-10-CM

## 2019-07-09 DIAGNOSIS — R160 Hepatomegaly, not elsewhere classified: Secondary | ICD-10-CM

## 2019-07-09 LAB — SURGICAL PATHOLOGY

## 2019-07-09 MED ORDER — ADULT MULTIVITAMIN W/MINERALS CH
1.0000 | ORAL_TABLET | Freq: Every day | ORAL | Status: DC
  Administered 2019-07-10 – 2019-07-17 (×3): 1 via ORAL
  Filled 2019-07-09 (×6): qty 1

## 2019-07-09 MED ORDER — PRO-STAT SUGAR FREE PO LIQD
30.0000 mL | Freq: Three times a day (TID) | ORAL | Status: DC
  Administered 2019-07-10 (×2): 30 mL via ORAL
  Filled 2019-07-09 (×5): qty 30

## 2019-07-09 MED ORDER — POTASSIUM CHLORIDE 10 MEQ/100ML IV SOLN: 10.0000 meq | INTRAVENOUS | Status: DC

## 2019-07-09 NOTE — Progress Notes (Signed)
[facility]  PROGRESS NOTE  Rachel Vasquez [id number] DOB: 1956-03-06 DOA: 07/02/2019 PCP: Patient, [name] Per Admit date - 07/02/2019   Admitting Physician Geradine Girt, DO  Outpatient Primary MD for the patient is Patient, [name] Per  LOS - 6 Brief Narrative   Rachel Vasquez is a 64 y.o. year old female with no significant medical history who presented on 07/02/2019 with progressive nausea, vomiting, abdominal pain weakness and generalized malaise over the course of 3 months.  Patient was undergoing GI work-up with plan ultrasound and EGD to be performed before worsening symptoms caused her to present to the ED.  CT abdomen showed generalized pancreatic inflammation as well as several ill-defined hypodense masses of the pancreatic head and liver concerning for metastatic disease; as well as bulky enlargement of the uterus with multiple large masses increased compared to prior.  Lipase 92, as well as pancreatic inflammation consistent with pancreatitis with no evidence of biliary obstruction likely related to pancreatic masses mentioned above.    During this hospitalization, patient underwent IR guided biopsy on 1/4 and is currently awaiting pathology results.  Patient has had diminished p.o. intake related to ongoing nausea/intermittent vomiting and abdominal pain is currently being monitored to determine necessity for alternative means of nutrition in addition to pain control  Subjective  Eating solid food for the first time today she says. Ate half of her grilled cheese sandwich for lunch has some nausea and abdominal discomfort but no pain. Says boost makes her more nauseous. No vomiting. Want to continue to try eating orally before considering feeding tube  A & P   Pancreatic mass with suspected hepatic metastases Status post IR guided biopsy -Pathology pending  Nausea, intermittent vomiting, abdominal pain secondary to pancreatitis, due to above Abdominal pain, nausea and  vomiting seems to be improving, tolerating more solid p.o. intake for the first time, no evidence of biliary obstruction -Continue encourage oral intake, nutritional supplementation -Low threshold to consider feeding tube -Pain control: Tylenol as needed mild pain, Norco every 4 hours as needed moderate pain, IV morphine 4 mg every 3 hours as needed severe pain, bowel regimen  -Gentle hydration with IV fluids -Scheduled IV Protonix twice daily  Hypokalemia, due to above -check mg -correct orally  Microcytic anemia Hemoglobin remained stable around 10-11 Could be related to possible malignancy -Check iron panel     Family Communication  : None at bedside  Code Status : Full code  Disposition Plan  : Needs continued inpatient stay for IV pain control, improve nutritional support given failure to thrive related to pancreatitis and likely malignant masses, electrolyte support  Consults  : GI, IR  Procedures  : IR guided biopsy, 1/4  DVT Prophylaxis  :  Lovenox   Lab Results  Component Value Date   PLT 250 07/08/2019    Diet :  Diet Order            DIET SOFT Room service appropriate? Yes; Fluid consistency: Thin  Diet effective now               Inpatient Medications Scheduled Meds: . enoxaparin (LOVENOX) injection  40 mg Subcutaneous Q24H  . feeding supplement  1 Container Oral TID BM  . pantoprazole (PROTONIX) IV  40 mg Intravenous Q12H   Continuous Infusions: . sodium chloride 50 mL/hr at 07/08/19 1436   PRN Meds:.acetaminophen **OR** acetaminophen, bisacodyl, HYDROcodone-acetaminophen, morphine injection, ondansetron **OR** ondansetron (ZOFRAN) IV, polyethylene glycol, simethicone, zolpidem  Antibiotics  :  Anti-infectives (From admission, onward)   None       Objective   Vitals:   07/09/19 0033 07/09/19 0610 07/09/19 0843 07/09/19 1227  BP: (!) 165/102 (!) 153/86 (!) 156/82 (!) 158/91  Pulse: 93 80 80 81  Resp: 18 18  18   Temp: 98 F (36.7 C)  98.2 F (36.8 C)  98.2 F (36.8 C)  TempSrc: Oral Oral  Oral  SpO2: 97% 98%  100%  Weight:  111 kg    Height:        SpO2: 100 % O2 Flow Rate (L/min): 2 L/min  Wt Readings from Last 3 Encounters:  07/09/19 111 kg  06/30/19 112.5 kg     Intake/Output Summary (Last 24 hours) at 07/09/2019 1252 Last data filed at 07/09/2019 0455 Gross per 24 hour  Intake 713.64 ml  Output --  Net 713.64 ml    Physical Exam:  Awake Alert, Oriented X 3 Sleepy but easily arousable No new F.N deficits,  [place].AT, No JVD Symmetrical Chest wall movement on room air, Good air movement bilaterally, CTAB RRR,No Gallops,Rubs or new Murmurs,  +ve B.Sounds, Abd Soft, No tenderness, No rebound, guarding or rigidity. No Cyanosis, Clubbing or edema, No new Rash or bruise     I have personally reviewed the following:   Data Reviewed:  CBC Recent Labs  Lab 07/02/19 1448 07/03/19 0522 07/04/19 0135 07/06/19 0119 07/08/19 0548  WBC 5.6 5.5 4.4 5.6 6.2  HGB 13.1 11.9* 11.3* 10.8* 11.6*  HCT 42.5 38.4 36.3 34.8* 37.6  PLT 297 228 228 199 250  MCV 74.3* 73.8* 74.7* 75.0* 74.0*  MCH 22.9* 22.9* 23.3* 23.3* 22.8*  MCHC 30.8 31.0 31.1 31.0 30.9  RDW 14.1 14.3 14.3 14.6 14.4    Chemistries  Recent Labs  Lab 07/02/19 1448 07/03/19 0522 07/04/19 0135 07/04/19 1107 07/05/19 0459 07/06/19 0119 07/08/19 0548  NA 138 138 139  --  137 135 138  K 3.5 3.6 3.5  --  3.3* 3.5 3.2*  CL 97* 100 102  --  101 100 99  CO2 27 27 27   --  26 22 23   GLUCOSE 138* 122* 121*  --  120* 112* 118*  BUN 12 12 9   --  <5* <5* <5*  CREATININE 0.90 0.87 0.79  --  0.75 0.69 0.74  CALCIUM 9.4 8.9 8.7*  --  8.6* 8.1* 9.1  AST 21 22  --  44* 41 29  --   ALT 24 24  --  45* 51* 41  --   ALKPHOS 85 73  --  90 97 92  --   BILITOT 0.7 1.0  --  1.3* 1.0 1.1  --    ------------------------------------------------------------------------------------------------------------------ No results for input(s): CHOL, HDL, LDLCALC,  TRIG, CHOLHDL, LDLDIRECT in the last 72 hours.  Lab Results  Component Value Date   HGBA1C 6.5 (H) 07/03/2019   ------------------------------------------------------------------------------------------------------------------ No results for input(s): TSH, T4TOTAL, T3FREE, THYROIDAB in the last 72 hours.  Invalid input(s): FREET3 ------------------------------------------------------------------------------------------------------------------ No results for input(s): VITAMINB12, FOLATE, FERRITIN, TIBC, IRON, RETICCTPCT in the last 72 hours.  Coagulation profile Recent Labs  Lab 07/07/19 0050  INR 1.2    No results for input(s): DDIMER in the last 72 hours.  Cardiac Enzymes No results for input(s): CKMB, TROPONINI, MYOGLOBIN in the last 168 hours.  Invalid input(s): CK ------------------------------------------------------------------------------------------------------------------ No results found for: BNP  Micro Results Recent Results (from the past 240 hour(s))  SARS CORONAVIRUS 2 (TAT 6-24 HRS) Nasopharyngeal Nasopharyngeal Swab  Status: None   Collection Time: 07/02/19 11:40 PM   Specimen: Nasopharyngeal Swab  Result Value Ref Range Status   SARS Coronavirus 2 NEGATIVE NEGATIVE Final    Comment: (NOTE) SARS-CoV-2 target nucleic acids are NOT DETECTED. The SARS-CoV-2 RNA is generally detectable in upper and lower respiratory specimens during the acute phase of infection. Negative results do not preclude SARS-CoV-2 infection, do not rule out co-infections with other pathogens, and should not be used as the sole basis for treatment or other patient management decisions. Negative results must be combined with clinical observations, patient history, and epidemiological information. The expected result is Negative. Fact Sheet for Patients: SugarRoll.be Fact Sheet for Healthcare Providers: https://www.woods-mathews.com/ This  test is not yet approved or cleared by the [place] FDA and  has been authorized for detection and/or diagnosis of SARS-CoV-2 by FDA under an Emergency Use Authorization (EUA). This EUA will remain  in effect (meaning this test can be used) for the duration of the COVID-19 declaration under Section 56 4(b)(1) of the Act, 21 U.S.C. section 360bbb-3(b)(1), unless the authorization is terminated or revoked sooner. Performed at [facility], [place] [address], [place], [place] [address]     Radiology Reports CT CHEST W CONTRAST  Result Date: 07/07/2019 CLINICAL DATA:  Liver mass.  Concern for hepatic metastasis EXAM: CT CHEST WITH CONTRAST TECHNIQUE: Multidetector CT imaging of the chest was performed during intravenous contrast administration. CONTRAST:  57mL OMNIPAQUE IOHEXOL 300 MG/ML  SOLN COMPARISON:  CT 07/02/2019 FINDINGS: Cardiovascular: No significant vascular findings. Normal heart size. No pericardial effusion. Mediastinum/Nodes: No axillary supraclavicular adenopathy. Multiple nodules within the thyroid gland measures 12 mm or less. This is not meet the size requirement for follow-up imaging. No mediastinal lymphadenopathy. Moderate to large hiatal hernia posterior to the LEFT heart. Lungs/Pleura: Linear atelectasis in the lingula and medial RIGHT middle lobe. No suspicious nodularity. Upper Abdomen: Several low-density suspicious lesions in the liver again demonstrated. Inflammation potential mass in the pancreatic head again noted. No interval change from 07/02/2019. Musculoskeletal: No aggressive osseous lesion. IMPRESSION: 1. No evidence of thoracic metastasis. 2. Low-density lesions in the liver concerning for metastasis. See recent abdomen CT 07/02/2019. 3. Inflammation head of pancreas with concern for underlying neoplasm. Recommend appropriate tumor biomarkers. Electronically Signed   By: Suzy Bouchard M.D.   On: 07/07/2019 08:23   CT Abdomen Pelvis W Contrast  Result  Date: 07/02/2019 CLINICAL DATA:  Abdominal pain with nausea and vomiting EXAM: CT ABDOMEN AND PELVIS WITH CONTRAST TECHNIQUE: Multidetector CT imaging of the abdomen and pelvis was performed using the standard protocol following bolus administration of intravenous contrast. CONTRAST:  175mL OMNIPAQUE IOHEXOL 300 MG/ML  SOLN COMPARISON:  CT 04/26/2004 FINDINGS: Lower chest: Lung bases demonstrate subsegmental atelectasis at the lingula, right middle lobe and inferior right upper lobe. No acute consolidation or pleural effusion. The heart size is within normal limits. Irregular moderate hiatal hernia with wall thickening. This is increased compared to prior. Hepatobiliary: Scattered hypodense liver lesions, some of which were present on comparison study. Multiple new ill-defined hypodense liver masses concerning for metastatic disease. Largest suspicious lesion is seen in the right hepatic dome and measures 2.5 cm. Multiple calcified gallstones. No biliary dilatation. Pancreas: Generalized inflammatory changes about the pancreas. Development of mild ductal dilatation. Ill-defined hypodense mass at the head of the pancreas measuring approximately 3.4 cm transverse by 2.2 cm AP by 3 cm craniocaudad. Spleen: Normal in size without focal abnormality. Adrenals/Urinary Tract: Adrenal glands are unremarkable. Kidneys  are normal, without renal calculi, focal lesion, or hydronephrosis. Bladder is unremarkable. Stomach/Bowel: No dilated small bowel. Negative for colon wall thickening. Negative appendix. Vascular/Lymphatic: Nonaneurysmal aorta. Hazy appearance of mesenteric root with subcentimeter nodes. Multiple small nodes or punctate nodules in the right lower quadrant/right gutter. Hypoenhancement of the distal splenic vein and superior mesenteric vein. Reproductive: Markedly enlarged uterus with multiple masses, increased compared to prior. Other: Negative for free air or significant ascites. Anterior abdominal wall  laxity. Small supraumbilical fat containing ventral hernia Musculoskeletal: No acute or suspicious osseous abnormality. IMPRESSION: 1. Generalized pancreatic inflammation. There is an ill-defined hypodense suspicious mass at the head of the pancreas measuring 3.4 x 2.2 x 3 cm. 2. Multiple ill-defined hypodense liver masses, consistent with metastatic disease. 3. Hypoenhancement of the splenic vein and superior mesenteric veins near their confluence, potentially occluded. 4. Multiple punctate nodules or small nodes in the right lower quadrant/gutter, cannot exclude metastatic disease 5. Moderate irregular hiatal hernia with some wall thickening, could be correlated with endoscopy 6. Bulky enlargement of the uterus with multiple large masses, increased compared to prior. Electronically Signed   By: Donavan Foil M.D.   On: 07/02/2019 22:17   US BIOPSY (LIVER)  Result Date: 07/07/2019 INDICATION: Concern for metastatic pancreatic cancer. Please perform ultrasound-guided liver lesion biopsy for tissue diagnostic purposes. EXAM: ULTRASOUND GUIDED LIVER LESION BIOPSY COMPARISON:  CT abdomen and pelvis-07/02/2019 MEDICATIONS: None ANESTHESIA/SEDATION: Fentanyl 100 mcg IV; Versed 2 mg IV Total Moderate Sedation time:  17 Minutes. The patient's level of consciousness and vital signs were monitored continuously by radiology nursing throughout the procedure under my direct supervision. COMPLICATIONS: None immediate. PROCEDURE: Informed written consent was obtained from the patient after a discussion of the risks, benefits and alternatives to treatment. The patient understands and consents the procedure. A timeout was performed prior to the initiation of the procedure. Ultrasound scanning was performed of the right upper abdominal quadrant demonstrates an ill-defined approximately 2.9 x 3.1 x 2.2 cm hypoechoic nodule/mass within the dome of the right lobe of the liver correlating with the indeterminate mass seen on preceding  abdominal CT image 15, series 3. Note, imaging was degraded secondary to patient body habitus well as well as the lesion being located within the dome of the liver with associated interposition of lung. The procedure was planned. The right upper abdominal quadrant was prepped and draped in the usual sterile fashion. The overlying soft tissues were anesthetized with 1% lidocaine with epinephrine. A 17 gauge, 6.8 cm co-axial needle was advanced into a peripheral aspect of the lesion. This was followed by 6 core biopsies with an 18 gauge core device under direct ultrasound guidance. The coaxial needle tract was embolized with a small amount of Gel-Foam slurry and superficial hemostasis was obtained with manual compression. Post procedural scanning was negative for definitive area of hemorrhage or additional complication. A dressing was placed. The patient tolerated the procedure well without immediate post procedural complication. IMPRESSION: Technically challenging though ultimately successful ultrasound guided core needle biopsy of indeterminate mass within the dome of the right lobe of the liver. Electronically Signed   By: Sandi Mariscal M.D.   On: 07/07/2019 13:34   DG Abd 2 Views  Result Date: 07/08/2019 CLINICAL DATA:  Abdominal pain and distension x3 days. EXAM: ABDOMEN - 2 VIEW COMPARISON:  None. FINDINGS: The bowel gas pattern is normal. A moderate to marked amount of stool is seen within the ascending colon. There is no evidence of free air. No radio-opaque calculi  or other significant radiographic abnormality is seen. A radiopaque tubal ligation clip is seen within the pelvis on the right. An additional tubal ligation clip is seen overlying the superior aspect of the sacrum on the left. A 4 mm focal opacity is seen overlying the symphysis pubis on the left. IMPRESSION: 1. Normal bowel gas pattern. No evidence of free air. Electronically Signed   By: Virgina Norfolk M.D.   On: 07/08/2019 16:27     Time  Spent in minutes  30     Desiree Hane M.D on 07/09/2019 at 12:52 PM  To page go to [email or web address] - password [facility]

## 2019-07-09 NOTE — Progress Notes (Addendum)
Nutrition Follow-up  RD working remotely.  DOCUMENTATION CODES:   Obesity unspecified  INTERVENTION:   -D/c Boost Breeze po TID, each supplement provides 250 kcal and 9 grams of protein -30 ml Prostat TID, each supplement provides 100 kcals and 15 grams protein -If short term tube feeding via NGT is pursued, recommend:  Initiate Vital 1.2 @ 25 ml/hr and increase by 10 ml every 8 hours to goal rate of 65 ml/hr.    Tube feeding regimen provides 1872 kcal (100% of needs), 117 grams of protein, and 1265 ml of H2O.   NUTRITION DIAGNOSIS:   Inadequate oral intake related to nausea, vomiting as evidenced by per patient/family report.  Ongoing  GOAL:   Patient will meet greater than or equal to 90% of their needs  Unmet  MONITOR:   PO intake, Supplement acceptance  REASON FOR ASSESSMENT:   Consult Diet education  ASSESSMENT:   64 yo female admitted with progressive nausea, vomiting, loss of appetite, fatigue, epigastric pain. CT scan showed mass at the head of the pancreas and hypodense liver lesions. No known PMH.  1/4- s/p liver lesion biopsy  Reviewed I/O's: +714 ml x 24 hours and +9.2 L since admission  Per GI notes, plan to treat pancreatitis with pain control currently. Awaiting on pathology for liver biopsy.   Pt remains with poor appetite. Noted meal completion documented at 0-25%. There have been instances in which pt has been refusing both food and medications. Intake has been extremely minimal over the past week- pt only consuming sips of vegetable broth and bites of fruit. Noted pt is now refusing Boost Breeze supplements.  Noted last BM documented on 07/02/19; suspect minimal intake is contributing.   Per MD notes, considering placement of NGT due to prolonged poor oral intake. Reached out to Dr. Lonny Prude via secure chat, who reports that she will discuss with pt today regarding possibility of shorting short term enteral nutrition. RD will place TF  recommendations in the event that pt is agreeable.    ADDENDUM (1259): Case discussed with Dr. Lonny Prude, who reports pt shares that this is the first day that she feels well and would like to wait another day prior to considering TF in case pt does eat better. She does not like the taste of Boost Breeze, as it causes her nausea.   Wt has been stable since admission.   Labs reviewed: K: 3.2.   Diet Order:   Diet Order            DIET SOFT Room service appropriate? Yes; Fluid consistency: Thin  Diet effective now              EDUCATION NEEDS:   Education needs have been addressed  Skin:  Skin Assessment: Skin Integrity Issues: Skin Integrity Issues:: Incisions Incisions: rt upper abdomen  Last BM:  07/02/19  Height:   Ht Readings from Last 1 Encounters:  07/02/19 5\' 7"  (1.702 m)    Weight:   Wt Readings from Last 1 Encounters:  07/09/19 111 kg    Ideal Body Weight:  61.4 kg  BMI:  Body mass index is 38.33 kg/m.  Estimated Nutritional Needs:   Kcal:  1800-2100  Protein:  115-130 gm  Fluid:  >/= 1.8 L    Caylea Foronda A. Jimmye Norman, RD, LDN, [place] Registered Dietitian II Certified Diabetes Care and Education Specialist Pager: [phone] After hours Pager: [phone]

## 2019-07-10 ENCOUNTER — Other Ambulatory Visit: Payer: Self-pay | Admitting: Oncology

## 2019-07-10 ENCOUNTER — Inpatient Hospital Stay (HOSPITAL_COMMUNITY): Payer: BC Managed Care – PPO

## 2019-07-10 ENCOUNTER — Telehealth: Payer: Self-pay | Admitting: Nurse Practitioner

## 2019-07-10 DIAGNOSIS — C259 Malignant neoplasm of pancreas, unspecified: Secondary | ICD-10-CM | POA: Diagnosis present

## 2019-07-10 DIAGNOSIS — E876 Hypokalemia: Secondary | ICD-10-CM

## 2019-07-10 DIAGNOSIS — K8689 Other specified diseases of pancreas: Secondary | ICD-10-CM

## 2019-07-10 LAB — CBC
HCT: 36.6 % (ref 36.0–46.0)
Hemoglobin: 11.8 g/dL — ABNORMAL LOW (ref 12.0–15.0)
MCH: 23.2 pg — ABNORMAL LOW (ref 26.0–34.0)
MCHC: 32.2 g/dL (ref 30.0–36.0)
MCV: 72 fL — ABNORMAL LOW (ref 80.0–100.0)
Platelets: 201 10*3/uL (ref 150–400)
RBC: 5.08 MIL/uL (ref 3.87–5.11)
RDW: 14.3 % (ref 11.5–15.5)
WBC: 6.1 10*3/uL (ref 4.0–10.5)
nRBC: 0 % (ref 0.0–0.2)

## 2019-07-10 LAB — BASIC METABOLIC PANEL
Anion gap: 15 (ref 5–15)
BUN: <5 mg/dL — ABNORMAL LOW (ref 8–23)
CO2: 24 mmol/L (ref 22–32)
Calcium: 8.8 mg/dL — ABNORMAL LOW (ref 8.9–10.3)
Chloride: 95 mmol/L — ABNORMAL LOW (ref 98–111)
Creatinine, Ser: 0.62 mg/dL (ref 0.44–1.00)
GFR calc Af Amer: >60 mL/min (ref >60)
GFR calc non Af Amer: >60 mL/min (ref >60)
Glucose, Bld: 135 mg/dL — ABNORMAL HIGH (ref 70–99)
Potassium: 3.1 mmol/L — ABNORMAL LOW (ref 3.5–5.1)
Sodium: 134 mmol/L — ABNORMAL LOW (ref 135–145)

## 2019-07-10 LAB — IRON AND TIBC
Iron: 72 ug/dL (ref 28–170)
Saturation Ratios: 35 % — ABNORMAL HIGH (ref 10.4–31.8)
TIBC: 203 ug/dL — ABNORMAL LOW (ref 250–450)
UIBC: 131 ug/dL

## 2019-07-10 LAB — FERRITIN: Ferritin: 341 ng/mL — ABNORMAL HIGH (ref 11–307)

## 2019-07-10 LAB — MAGNESIUM: Magnesium: 1.6 mg/dL — ABNORMAL LOW (ref 1.7–2.4)

## 2019-07-10 MED ORDER — MAGNESIUM SULFATE 2 GM/50ML IV SOLN
2.0000 g | Freq: Once | INTRAVENOUS | Status: AC
  Administered 2019-07-10: 2 g via INTRAVENOUS
  Filled 2019-07-10: qty 50

## 2019-07-10 MED ORDER — POTASSIUM CHLORIDE 10 MEQ/100ML IV SOLN
10.0000 meq | INTRAVENOUS | Status: AC
  Administered 2019-07-10 (×6): 10 meq via INTRAVENOUS
  Filled 2019-07-10 (×6): qty 100

## 2019-07-10 MED ORDER — AMLODIPINE BESYLATE 5 MG PO TABS
5.0000 mg | ORAL_TABLET | Freq: Every day | ORAL | Status: DC
  Administered 2019-07-10: 5 mg via ORAL
  Filled 2019-07-10: qty 1

## 2019-07-10 MED ORDER — HYDRALAZINE HCL 20 MG/ML IJ SOLN: 5.0000 mg | Freq: Four times a day (QID) | INTRAMUSCULAR | Status: DC | PRN

## 2019-07-10 NOTE — Telephone Encounter (Signed)
A new pt appt has been scheduled for Rachel Vasquez to see Cira Rue and Dr. Burr Medico on 1/13 at 1:45pm. Pt is currently in the hospital. I sent a msg to [place] [facility] [place] Curcio,NP to provide the appt date and time to the pt.

## 2019-07-10 NOTE — Progress Notes (Signed)
Brief Oncology Note:  Chart has been reviewed.  Pathology is consistent with adenocarcinoma the pancreas.  Noted to have a pancreatic mass and liver lesions.  We are aware of this patient and her new diagnosis and I will arrange for outpatient follow-up at the cancer center.  Mikey Bussing, DNP, AGPCNP-BC, AOCNP

## 2019-07-10 NOTE — Consult Note (Signed)
I was asked by Dr. Teryl Lucy to comment on Rachel Vasquez new diagnosis of pancreatic cancer.  Full consult will be completed as an outpatient.  Imaging studies were personally reviewed and the pathology report discussed with the patient.  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  She does not have any biliary obstruction but does have some nausea and anorexia.  I discussed the natural course of this disease and future treatment options today.  She understands that she has an incurable malignancy but disease that can potentially be palliated.  She is reasonable health and reasonable performance status and palliative chemotherapy could be a consideration for her.  I recommended continued supportive management as you are doing with nutritional support to improve her nutritional status and improvement in her overall performance status.    Outpatient oncology follow-up already been arranged upon her discharge.  All her questions were answered today including tentative prognosis as well as tentative future plan.  She is scheduled to see Dr. Burr Medico for further discussion regarding her  cancer treatments.

## 2019-07-10 NOTE — Progress Notes (Signed)
[facility]  PROGRESS NOTE  Rachel Vasquez [id number] DOB: 12/16/1955 DOA: 07/02/2019 PCP: Patient, [name] Per Admit date - 07/02/2019   Admitting Physician Geradine Girt, DO  Outpatient Primary MD for the patient is Patient, [name] Per  LOS - 7 Brief Narrative   Rachel Vasquez is a 64 y.o. year old female with no significant medical history who presented on 07/02/2019 with progressive nausea, vomiting, abdominal pain weakness and generalized malaise over the course of 3 months.  Patient was undergoing GI work-up with plan ultrasound and EGD to be performed before worsening symptoms caused her to present to the ED.  CT abdomen showed generalized pancreatic inflammation as well as several ill-defined hypodense masses of the pancreatic head and liver concerning for metastatic disease; as well as bulky enlargement of the uterus with multiple large masses increased compared to prior.  Lipase 92, as well as pancreatic inflammation consistent with pancreatitis with no evidence of biliary obstruction likely related to pancreatic masses mentioned above.    During this hospitalization, patient underwent IR guided biopsy on 1/4 and is currently awaiting pathology results.  Patient has had diminished p.o. intake related to ongoing nausea/intermittent vomiting and abdominal pain is currently being monitored to determine necessity for alternative means of nutrition in addition to pain control  Subjective  Eating solid food for the first time today she says. Ate half of her grilled cheese sandwich for lunch has some nausea and abdominal discomfort but no pain. Says boost makes her more nauseous. No vomiting. 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 ABD xr shows mild stool burden but no obstruction.   -Continue encourage oral intake, nutritional supplementation -Low threshold to consider feeding tube -Pain control: Tylenol as needed mild pain, Norco every 4 hours as needed moderate pain, IV morphine 4 mg every 3 hours as needed severe pain, bowel regimen  -Gentle hydration with IV fluids -Scheduled IV Protonix twice daily  Hypokalemia/hypomagnesemia, due to above -check mg -check daily phos, BMP, mg -check prealbumin -correct orally - closely monitor for refeeding syndrome  Microcytic anemia Hemoglobin remained stable around 10-11 Could be related to possible malignancy -Check iron panel     Family Communication  : None at bedside  Code Status : Full code  Disposition Plan  : Needs continued inpatient stay for IV pain control and antiemetics, improve nutritional support given failure to thrive related to pancreatitis and metastatic pancreatic cancer, close monitoring of electrolytes with high risk for refeeding syndrome, electrolyte support  Consults  : GI, IR  Procedures  : IR guided biopsy, 1/4  DVT Prophylaxis  :  Lovenox   Lab Results  Component Value Date   PLT 201 07/10/2019    Diet :  Diet Order            DIET SOFT Room service appropriate? Yes; Fluid consistency: Thin  Diet effective now               Inpatient Medications Scheduled Meds: . amLODipine  5 mg Oral Daily  . enoxaparin (LOVENOX) injection  40  mg Subcutaneous Q24H  . feeding supplement (PRO-STAT SUGAR FREE 64)  30 mL Oral TID  . multivitamin with minerals  1 tablet Oral Daily  . pantoprazole (PROTONIX) IV  40 mg Intravenous Q12H   Continuous Infusions: . sodium chloride 50 mL/hr at 07/10/19 0449    PRN Meds:.acetaminophen **OR** acetaminophen, bisacodyl, hydrALAZINE, HYDROcodone-acetaminophen, morphine injection, ondansetron **OR** ondansetron (ZOFRAN) IV, polyethylene glycol, simethicone, zolpidem  Antibiotics  :   Anti-infectives (From admission, onward)   None       Objective   Vitals:   07/09/19 2350 07/10/19 0645 07/10/19 1217 07/10/19 1817  BP: (!) 148/88 (!) 152/76 (!) 171/90 (!) 149/76  Pulse: 82 79 85 84  Resp: 17 15 16 18   Temp: 98.1 F (36.7 C) (!) 97.3 F (36.3 C) 98.5 F (36.9 C) 97.8 F (36.6 C)  TempSrc: Oral Oral Oral Oral  SpO2: 97% 99% 100% 98%  Weight:      Height:        SpO2: 98 % O2 Flow Rate (L/min): 2 L/min  Wt Readings from Last 3 Encounters:  07/09/19 111 kg  06/30/19 112.5 kg     Intake/Output Summary (Last 24 hours) at 07/10/2019 2140 Last data filed at 07/10/2019 1500 Gross per 24 hour  Intake 1221.34 ml  Output 2 ml  Net 1219.34 ml    Physical Exam:  Awake Alert, Oriented X 3 Sleepy but easily arousable No new F.N deficits,  [place].AT, No JVD Symmetrical Chest wall movement on room air, Good air movement bilaterally, CTAB RRR,No Gallops,Rubs or new Murmurs,  +ve B.Sounds, Abd Soft, No tenderness, No rebound, guarding or rigidity. No Cyanosis, Clubbing or edema, No new Rash or bruise     I have personally reviewed the following:   Data Reviewed:  CBC Recent Labs  Lab 07/04/19 0135 07/06/19 0119 07/08/19 0548 07/10/19 0813  WBC 4.4 5.6 6.2 6.1  HGB 11.3* 10.8* 11.6* 11.8*  HCT 36.3 34.8* 37.6 36.6  PLT 228 199 250 201  MCV 74.7* 75.0* 74.0* 72.0*  MCH 23.3* 23.3* 22.8* 23.2*  MCHC 31.1 31.0 30.9 32.2  RDW 14.3 14.6 14.4 14.3    Chemistries  Recent Labs  Lab 07/04/19 0135 07/04/19 1107 07/05/19 0459 07/06/19 0119 07/08/19 0548 07/10/19 0813  NA 139  --  137 135 138 134*  K 3.5  --  3.3* 3.5 3.2* 3.1*  CL 102  --  101 100 99 95*  CO2 27  --  26 22 23 24   GLUCOSE 121*  --  120* 112* 118* 135*  BUN 9   --  <5* <5* <5* <5*  CREATININE 0.79  --  0.75 0.69 0.74 0.62  CALCIUM 8.7*  --  8.6* 8.1* 9.1 8.8*  MG  --   --   --   --   --  1.6*  AST  --  44* 41 29  --   --   ALT  --  45* 51* 41  --   --   ALKPHOS  --  90 97 92  --   --   BILITOT  --  1.3* 1.0 1.1  --   --    ------------------------------------------------------------------------------------------------------------------ No results for input(s): CHOL, HDL, LDLCALC, TRIG, CHOLHDL, LDLDIRECT in the last 72 hours.  Lab Results  Component Value Date   HGBA1C 6.5 (H) 07/03/2019   ------------------------------------------------------------------------------------------------------------------ No results for input(s): TSH, T4TOTAL, T3FREE, THYROIDAB in the last 72 hours.  Invalid input(s): FREET3 ------------------------------------------------------------------------------------------------------------------ Recent Labs    07/10/19  0813  FERRITIN 341*  TIBC 203*  IRON 72    Coagulation profile Recent Labs  Lab 07/07/19 0050  INR 1.2    No results for input(s): DDIMER in the last 72 hours.  Cardiac Enzymes No results for input(s): CKMB, TROPONINI, MYOGLOBIN in the last 168 hours.  Invalid input(s): CK ------------------------------------------------------------------------------------------------------------------ No results found for: BNP  Micro Results Recent Results (from the past 240 hour(s))  SARS CORONAVIRUS 2 (TAT 6-24 HRS) Nasopharyngeal Nasopharyngeal Swab     Status: None   Collection Time: 07/02/19 11:40 PM   Specimen: Nasopharyngeal Swab  Result Value Ref Range Status   SARS Coronavirus 2 NEGATIVE NEGATIVE Final    Comment: (NOTE) SARS-CoV-2 target nucleic acids are NOT DETECTED. The SARS-CoV-2 RNA is generally detectable in upper and lower respiratory specimens during the acute phase of infection. Negative results do not preclude SARS-CoV-2 infection, do not rule out co-infections with other  pathogens, and should not be used as the sole basis for treatment or other patient management decisions. Negative results must be combined with clinical observations, patient history, and epidemiological information. The expected result is Negative. Fact Sheet for Patients: SugarRoll.be Fact Sheet for Healthcare Providers: https://www.woods-mathews.com/ This test is not yet approved or cleared by the [place] FDA and  has been authorized for detection and/or diagnosis of SARS-CoV-2 by FDA under an Emergency Use Authorization (EUA). This EUA will remain  in effect (meaning this test can be used) for the duration of the COVID-19 declaration under Section 56 4(b)(1) of the Act, 21 U.S.C. section 360bbb-3(b)(1), unless the authorization is terminated or revoked sooner. Performed at [facility], [place] [address], [place], [place] [address]     Radiology Reports CT CHEST W CONTRAST  Result Date: 07/07/2019 CLINICAL DATA:  Liver mass.  Concern for hepatic metastasis EXAM: CT CHEST WITH CONTRAST TECHNIQUE: Multidetector CT imaging of the chest was performed during intravenous contrast administration. CONTRAST:  42mL OMNIPAQUE IOHEXOL 300 MG/ML  SOLN COMPARISON:  CT 07/02/2019 FINDINGS: Cardiovascular: No significant vascular findings. Normal heart size. No pericardial effusion. Mediastinum/Nodes: No axillary supraclavicular adenopathy. Multiple nodules within the thyroid gland measures 12 mm or less. This is not meet the size requirement for follow-up imaging. No mediastinal lymphadenopathy. Moderate to large hiatal hernia posterior to the LEFT heart. Lungs/Pleura: Linear atelectasis in the lingula and medial RIGHT middle lobe. No suspicious nodularity. Upper Abdomen: Several low-density suspicious lesions in the liver again demonstrated. Inflammation potential mass in the pancreatic head again noted. No interval change from 07/02/2019.  Musculoskeletal: No aggressive osseous lesion. IMPRESSION: 1. No evidence of thoracic metastasis. 2. Low-density lesions in the liver concerning for metastasis. See recent abdomen CT 07/02/2019. 3. Inflammation head of pancreas with concern for underlying neoplasm. Recommend appropriate tumor biomarkers. Electronically Signed   By: Suzy Bouchard M.D.   On: 07/07/2019 08:23   CT Abdomen Pelvis W Contrast  Result Date: 07/02/2019 CLINICAL DATA:  Abdominal pain with nausea and vomiting EXAM: CT ABDOMEN AND PELVIS WITH CONTRAST TECHNIQUE: Multidetector CT imaging of the abdomen and pelvis was performed using the standard protocol following bolus administration of intravenous contrast. CONTRAST:  164mL OMNIPAQUE IOHEXOL 300 MG/ML  SOLN COMPARISON:  CT 04/26/2004 FINDINGS: Lower chest: Lung bases demonstrate subsegmental atelectasis at the lingula, right middle lobe and inferior right upper lobe. No acute consolidation or pleural effusion. The heart size is within normal limits. Irregular moderate hiatal hernia with wall thickening. This is increased compared to prior. Hepatobiliary: Scattered hypodense liver lesions,  some of which were present on comparison study. Multiple new ill-defined hypodense liver masses concerning for metastatic disease. Largest suspicious lesion is seen in the right hepatic dome and measures 2.5 cm. Multiple calcified gallstones. No biliary dilatation. Pancreas: Generalized inflammatory changes about the pancreas. Development of mild ductal dilatation. Ill-defined hypodense mass at the head of the pancreas measuring approximately 3.4 cm transverse by 2.2 cm AP by 3 cm craniocaudad. Spleen: Normal in size without focal abnormality. Adrenals/Urinary Tract: Adrenal glands are unremarkable. Kidneys are normal, without renal calculi, focal lesion, or hydronephrosis. Bladder is unremarkable. Stomach/Bowel: No dilated small bowel. Negative for colon wall thickening. Negative appendix.  Vascular/Lymphatic: Nonaneurysmal aorta. Hazy appearance of mesenteric root with subcentimeter nodes. Multiple small nodes or punctate nodules in the right lower quadrant/right gutter. Hypoenhancement of the distal splenic vein and superior mesenteric vein. Reproductive: Markedly enlarged uterus with multiple masses, increased compared to prior. Other: Negative for free air or significant ascites. Anterior abdominal wall laxity. Small supraumbilical fat containing ventral hernia Musculoskeletal: No acute or suspicious osseous abnormality. IMPRESSION: 1. Generalized pancreatic inflammation. There is an ill-defined hypodense suspicious mass at the head of the pancreas measuring 3.4 x 2.2 x 3 cm. 2. Multiple ill-defined hypodense liver masses, consistent with metastatic disease. 3. Hypoenhancement of the splenic vein and superior mesenteric veins near their confluence, potentially occluded. 4. Multiple punctate nodules or small nodes in the right lower quadrant/gutter, cannot exclude metastatic disease 5. Moderate irregular hiatal hernia with some wall thickening, could be correlated with endoscopy 6. Bulky enlargement of the uterus with multiple large masses, increased compared to prior. Electronically Signed   By: Donavan Foil M.D.   On: 07/02/2019 22:17   US BIOPSY (LIVER)  Result Date: 07/07/2019 INDICATION: Concern for metastatic pancreatic cancer. Please perform ultrasound-guided liver lesion biopsy for tissue diagnostic purposes. EXAM: ULTRASOUND GUIDED LIVER LESION BIOPSY COMPARISON:  CT abdomen and pelvis-07/02/2019 MEDICATIONS: None ANESTHESIA/SEDATION: Fentanyl 100 mcg IV; Versed 2 mg IV Total Moderate Sedation time:  17 Minutes. The patient's level of consciousness and vital signs were monitored continuously by radiology nursing throughout the procedure under my direct supervision. COMPLICATIONS: None immediate. PROCEDURE: Informed written consent was obtained from the patient after a discussion of the  risks, benefits and alternatives to treatment. The patient understands and consents the procedure. A timeout was performed prior to the initiation of the procedure. Ultrasound scanning was performed of the right upper abdominal quadrant demonstrates an ill-defined approximately 2.9 x 3.1 x 2.2 cm hypoechoic nodule/mass within the dome of the right lobe of the liver correlating with the indeterminate mass seen on preceding abdominal CT image 15, series 3. Note, imaging was degraded secondary to patient body habitus well as well as the lesion being located within the dome of the liver with associated interposition of lung. The procedure was planned. The right upper abdominal quadrant was prepped and draped in the usual sterile fashion. The overlying soft tissues were anesthetized with 1% lidocaine with epinephrine. A 17 gauge, 6.8 cm co-axial needle was advanced into a peripheral aspect of the lesion. This was followed by 6 core biopsies with an 18 gauge core device under direct ultrasound guidance. The coaxial needle tract was embolized with a small amount of Gel-Foam slurry and superficial hemostasis was obtained with manual compression. Post procedural scanning was negative for definitive area of hemorrhage or additional complication. A dressing was placed. The patient tolerated the procedure well without immediate post procedural complication. IMPRESSION: Technically challenging though ultimately successful ultrasound guided core  needle biopsy of indeterminate mass within the dome of the right lobe of the liver. Electronically Signed   By: Sandi Mariscal M.D.   On: 07/07/2019 13:34   DG Abd 2 Views  Result Date: 07/08/2019 CLINICAL DATA:  Abdominal pain and distension x3 days. EXAM: ABDOMEN - 2 VIEW COMPARISON:  None. FINDINGS: The bowel gas pattern is normal. A moderate to marked amount of stool is seen within the ascending colon. There is no evidence of free air. No radio-opaque calculi or other significant  radiographic abnormality is seen. A radiopaque tubal ligation clip is seen within the pelvis on the right. An additional tubal ligation clip is seen overlying the superior aspect of the sacrum on the left. A 4 mm focal opacity is seen overlying the symphysis pubis on the left. IMPRESSION: 1. Normal bowel gas pattern. No evidence of free air. Electronically Signed   By: Virgina Norfolk M.D.   On: 07/08/2019 16:27   DG Abd Portable 1V  Result Date: 07/10/2019 CLINICAL DATA:  Abdominal pain EXAM: PORTABLE ABDOMEN - 1 VIEW COMPARISON:  07/08/2019 FINDINGS: Bowel gas pattern remains unremarkable. Stool burden is overall mild with greater burden within the ascending colon. Tubal ligation clips noted. IMPRESSION: Normal bowel gas pattern. Electronically Signed   By: Macy Mis M.D.   On: 07/10/2019 13:20     Time Spent in minutes  30     Desiree Hane M.D on 07/10/2019 at 9:40 PM  To page go to [email or web address] - password [facility]

## 2019-07-11 ENCOUNTER — Encounter (HOSPITAL_COMMUNITY): Payer: Self-pay | Admitting: Internal Medicine

## 2019-07-11 DIAGNOSIS — R1084 Generalized abdominal pain: Secondary | ICD-10-CM

## 2019-07-11 DIAGNOSIS — K8689 Other specified diseases of pancreas: Secondary | ICD-10-CM

## 2019-07-11 DIAGNOSIS — E876 Hypokalemia: Secondary | ICD-10-CM

## 2019-07-11 DIAGNOSIS — C259 Malignant neoplasm of pancreas, unspecified: Secondary | ICD-10-CM

## 2019-07-11 LAB — BASIC METABOLIC PANEL
Anion gap: 14 (ref 5–15)
BUN: <5 mg/dL — ABNORMAL LOW (ref 8–23)
CO2: 25 mmol/L (ref 22–32)
Calcium: 8.8 mg/dL — ABNORMAL LOW (ref 8.9–10.3)
Chloride: 94 mmol/L — ABNORMAL LOW (ref 98–111)
Creatinine, Ser: 0.63 mg/dL (ref 0.44–1.00)
GFR calc Af Amer: >60 mL/min (ref >60)
GFR calc non Af Amer: >60 mL/min (ref >60)
Glucose, Bld: 129 mg/dL — ABNORMAL HIGH (ref 70–99)
Potassium: 3 mmol/L — ABNORMAL LOW (ref 3.5–5.1)
Sodium: 133 mmol/L — ABNORMAL LOW (ref 135–145)

## 2019-07-11 LAB — PHOSPHORUS: Phosphorus: 3 mg/dL (ref 2.5–4.6)

## 2019-07-11 LAB — PREALBUMIN: Prealbumin: 6.1 mg/dL — ABNORMAL LOW (ref 18–38)

## 2019-07-11 LAB — CBC
HCT: 36.3 % (ref 36.0–46.0)
Hemoglobin: 11.6 g/dL — ABNORMAL LOW (ref 12.0–15.0)
MCH: 22.9 pg — ABNORMAL LOW (ref 26.0–34.0)
MCHC: 32 g/dL (ref 30.0–36.0)
MCV: 71.7 fL — ABNORMAL LOW (ref 80.0–100.0)
Platelets: 231 10*3/uL (ref 150–400)
RBC: 5.06 MIL/uL (ref 3.87–5.11)
RDW: 14.4 % (ref 11.5–15.5)
WBC: 7.3 10*3/uL (ref 4.0–10.5)
nRBC: 0 % (ref 0.0–0.2)

## 2019-07-11 LAB — MAGNESIUM: Magnesium: 1.9 mg/dL (ref 1.7–2.4)

## 2019-07-11 MED ORDER — OXYCODONE HCL ER 10 MG PO T12A
10.0000 mg | EXTENDED_RELEASE_TABLET | Freq: Two times a day (BID) | ORAL | Status: DC
  Administered 2019-07-11 – 2019-07-14 (×6): 10 mg via ORAL
  Filled 2019-07-11 (×7): qty 1

## 2019-07-11 MED ORDER — PROMETHAZINE HCL 25 MG/ML IJ SOLN
6.2500 mg | Freq: Four times a day (QID) | INTRAMUSCULAR | Status: DC | PRN
  Administered 2019-07-11 – 2019-07-15 (×3): 6.25 mg via INTRAVENOUS
  Filled 2019-07-11 (×4): qty 1

## 2019-07-11 MED ORDER — LORAZEPAM 0.5 MG PO TABS
0.5000 mg | ORAL_TABLET | Freq: Four times a day (QID) | ORAL | Status: DC | PRN
  Administered 2019-07-16: 0.5 mg via ORAL
  Filled 2019-07-11: qty 1

## 2019-07-11 MED ORDER — OXYCODONE HCL 5 MG PO TABS
5.0000 mg | ORAL_TABLET | ORAL | Status: DC | PRN
  Administered 2019-07-11 – 2019-07-17 (×10): 5 mg via ORAL
  Filled 2019-07-11 (×12): qty 1

## 2019-07-11 MED ORDER — AMLODIPINE BESYLATE 10 MG PO TABS
10.0000 mg | ORAL_TABLET | Freq: Every day | ORAL | Status: DC
  Administered 2019-07-11 – 2019-07-17 (×6): 10 mg via ORAL
  Filled 2019-07-11 (×8): qty 1

## 2019-07-11 MED ORDER — POTASSIUM CHLORIDE 10 MEQ/100ML IV SOLN
10.0000 meq | INTRAVENOUS | Status: AC
  Administered 2019-07-11 (×5): 10 meq via INTRAVENOUS
  Filled 2019-07-11 (×4): qty 100

## 2019-07-11 MED ORDER — POTASSIUM CHLORIDE 10 MEQ/100ML IV SOLN
10.0000 meq | INTRAVENOUS | Status: AC
  Filled 2019-07-11: qty 100

## 2019-07-11 MED ORDER — BISACODYL 10 MG RE SUPP
10.0000 mg | Freq: Once | RECTAL | Status: AC
  Administered 2019-07-12: 10 mg via RECTAL
  Filled 2019-07-11: qty 1

## 2019-07-11 NOTE — Plan of Care (Signed)
  Problem: Health Behavior/Discharge Planning: Goal: Ability to manage health-related needs will improve Outcome: Progressing Note: Pt was alert and able to discuss the plan of care and discharge will providers during my care.    Problem: Clinical Measurements: Goal: Will remain free from infection Outcome: Progressing Note: Pt has shown no new signs of infections during my care.    Problem: Nutrition: Goal: Adequate nutrition will be maintained Outcome: Not Progressing Note: Pt has not been able to tolerate po's during my care. Pt has vomited once after eating a couple bites of eggs from her breakfast tray.    Problem: Elimination: Goal: Will not experience complications related to bowel motility Outcome: Not Progressing Note: Pt has not had a BM during my care.    Problem: Pain Managment: Goal: General experience of comfort will improve Outcome: Not Progressing Note: Pt has continued to complain of pain during my care. Pt has been hesitant to take po medication d/t nausea and has taken 3 doses of IV Morphine. Pain management discussed with providers and changes made.

## 2019-07-11 NOTE — Consult Note (Addendum)
[place]  [phone] [phone]   MEDICAL ONCOLOGY - INITIAL CONSULTATION  Referral MD: Dr. Oretha Milch  Reason for Referral: Newly diagnosed pancreatic adenocarcinoma  HPI: Ms. Silvis is a 64 year old female with a past medical history significant for GERD, fibroids, and gallstones.  She presented to the hospital with 103-month history of progressive nausea, vomiting, anorexia, fatigue, and epigastric discomfort.  She attributed her symptoms to reflux and was taking a PPI.  She had been seen by gastroenterology who was planning for an abdominal ultrasound and EGD, but symptoms worsen before this could be done and she presented to the emergency room.  A CT of the abdomen pelvis with contrast was performed on 07/02/2019 which showed generalized pancreatic inflammation with an ill-defined hypodense suspicious mass at the head of the pancreas measuring 3.4 x 2.2 x 3 cm.  There are also multiple ill-defined hypodense liver masses consistent with metastatic disease.  CT also showed hypoenhancement of the splenic vein and superior mesenteric veins near the confluence which are potentially occluded and multiple punctate nodules or small lymph nodes in the right lower quadrant/gutter cannot exclude metastatic disease.  She also has bulky enlargement of the uterus with multiple large masses consistent with her known history of fibroids.  A CT of the chest with contrast was performed on 07/07/2019 which showed no evidence of thoracic metastasis again showed the low-density lesions in the liver concerning for metastasis.  She underwent an ultrasound-guided biopsy of the liver on 07/07/2019 and results were consistent with pancreatic adenocarcinoma.   Today, the patient has ongoing abdominal discomfort, nausea, and vomiting.  She is really unable to keep anything down at this point.  She denies fevers and chills.  Reports a dull headache since she has been here at the hospital.  She  reports anorexia and weight loss of approximately 20 pounds over the past few months.  Has intermittent chest discomfort and some shortness of breath with exertion.  Denies diarrhea and states that she has not had a bowel movement in a few days but attributes this to not being able to eat anything.  Reports bilateral pedal and ankle edema.  She has not noticed any bleeding except for vaginal spotting.  The patient is single.  She has 2 children who live out of the area.  She has a sister that lives in [place], [place] who will be coming to see her. Denies tobacco use.  States that she used to drink but has not in many years.  Medical oncology was asked see the patient to make recommendations regarding her newly diagnosed pancreatic adenocarcinoma.   Past Medical History:  Diagnosis Date  . GERD (gastroesophageal reflux disease)   . Hiatal hernia   . Liver lesion   . Medical history non-contributory   . Pancreatic mass   :  History reviewed. No pertinent surgical history.:  Current Facility-Administered Medications  Medication Dose Route Frequency Provider Last Rate Last Admin  . 0.9 %  sodium chloride infusion   Intravenous Continuous Geradine Girt, DO 50 mL/hr at 07/10/19 2207 New Bag at 07/10/19 2207  . acetaminophen (TYLENOL) tablet 650 mg  650 mg Oral Q6H PRN Opyd, Ilene Qua, MD       Or  . acetaminophen (TYLENOL) suppository 650 mg  650 mg Rectal Q6H PRN Opyd, Ilene Qua, MD      . amLODipine (NORVASC) tablet 10 mg  10 mg Oral Daily Oretha Milch D, MD   10 mg at  07/11/19 1106  . bisacodyl (DULCOLAX) suppository 10 mg  10 mg Rectal Daily PRN Eulogio Bear U, DO   10 mg at 07/07/19 1426  . enoxaparin (LOVENOX) injection 40 mg  40 mg Subcutaneous Q24H Candiss Norse A, PA-C      . feeding supplement (PRO-STAT SUGAR FREE 64) liquid 30 mL  30 mL Oral TID Oretha Milch D, MD   30 mL at 07/10/19 1520  . hydrALAZINE (APRESOLINE) injection 5-10 mg  5-10 mg Intravenous Q6H PRN Oretha Milch D, MD      . LORazepam (ATIVAN) tablet 0.5 mg  0.5 mg Oral Q6H PRN Mikey Bussing R, NP      . morphine 4 MG/ML injection 4 mg  4 mg Intravenous Q3H PRN Eulogio Bear U, DO   4 mg at 07/11/19 1305  . multivitamin with minerals tablet 1 tablet  1 tablet Oral Daily Oretha Milch D, MD   1 tablet at 07/10/19 1045  . ondansetron (ZOFRAN) tablet 4 mg  4 mg Oral Q6H PRN Opyd, Ilene Qua, MD       Or  . ondansetron (ZOFRAN) injection 4 mg  4 mg Intravenous Q6H PRN Opyd, Ilene Qua, MD   4 mg at 07/11/19 0952  . oxyCODONE (Oxy IR/ROXICODONE) immediate release tablet 5 mg  5 mg Oral Q4H PRN Maryanna Shape, NP      . oxyCODONE (OXYCONTIN) 12 hr tablet 10 mg  10 mg Oral Q12H Curcio, Kristin R, NP      . pantoprazole (PROTONIX) injection 40 mg  40 mg Intravenous Q12H Vann, Jessica U, DO   40 mg at 07/11/19 0952  . polyethylene glycol (MIRALAX / GLYCOLAX) packet 17 g  17 g Oral Daily PRN Opyd, Ilene Qua, MD   17 g at 07/08/19 1732  . promethazine (PHENERGAN) injection 6.25 mg  6.25 mg Intravenous Q6H PRN Oretha Milch D, MD   6.25 mg at 07/11/19 1309  . simethicone (MYLICON) 40 [id number] suspension 40 mg  40 mg Oral QID PRN Opyd, Ilene Qua, MD   40 mg at 07/11/19 1106  . zolpidem (AMBIEN) tablet 5 mg  5 mg Oral QHS PRN Opyd, Ilene Qua, MD   5 mg at 07/09/19 2150     No Known Allergies:  Family History  Problem Relation Age of Onset  . Pancreatic cancer Father   . Lung cancer Paternal Uncle   :  Social History   Socioeconomic History  . Marital status: Single    Spouse name: Not on file  . Number of children: 2  . Years of education: Not on file  . Highest education level: Not on file  Occupational History  . Not on file  Tobacco Use  . Smoking status: Never Smoker  . Smokeless tobacco: Never Used  Substance and Sexual Activity  . Alcohol use: Not Currently  . Drug use: Never  . Sexual activity: Yes  Other Topics Concern  . Not on file  Social History Narrative  . Not on file    Social Determinants of Health   Financial Resource Strain:   . Difficulty of Paying Living Expenses: Not on file  Food Insecurity:   . Worried About Charity fundraiser in the Last Year: Not on file  . Ran Out of Food in the Last Year: Not on file  Transportation Needs:   . Lack of Transportation (Medical): Not on file  . Lack of Transportation (Non-Medical): Not on file  Physical Activity:   .  Days of Exercise per Week: Not on file  . Minutes of Exercise per Session: Not on file  Stress:   . Feeling of Stress : Not on file  Social Connections:   . Frequency of Communication with Friends and Family: Not on file  . Frequency of Social Gatherings with Friends and Family: Not on file  . Attends Religious Services: Not on file  . Active Member of Clubs or Organizations: Not on file  . Attends Archivist Meetings: Not on file  . Marital Status: Not on file  Intimate Partner Violence:   . Fear of Current or Ex-Partner: Not on file  . Emotionally Abused: Not on file  . Physically Abused: Not on file  . Sexually Abused: Not on file  :  Review of Systems: A comprehensive 14 point review of systems was negative except as noted in the HPI.  Exam: Patient Vitals for the past 24 hrs:  BP Temp Temp src Pulse Resp SpO2  07/11/19 1106 (!) 145/80 -- -- -- -- --  07/11/19 0609 (!) 171/98 (!) 97.4 F (36.3 C) Oral 90 17 93 %  07/11/19 0016 (!) 157/93 98 F (36.7 C) Oral 92 16 99 %  07/10/19 1817 (!) 149/76 97.8 F (36.6 C) Oral 84 18 98 %    General: Alert, appears uncomfortable at times. Eyes:  no scleral icterus.   ENT:  There were no oropharyngeal lesions.   Neck was without thyromegaly.   Lymphatics:  Negative cervical, supraclavicular or axillary adenopathy.   Respiratory: lungs were clear bilaterally without wheezing or crackles.   Cardiovascular:  Regular rate and rhythm, S1/S2, without murmur, rub or gallop.  Trace pedal edema bilaterally.   GI:  abdomen was soft,  flat, nontender, nondistended, without organomegaly.   Musculoskeletal: Moves all extremities x4. Skin exam was without echymosis, petichae.   Neuro exam was nonfocal. Patient was alert and oriented.  Attention was good.   Language was appropriate.  Mood was normal without depression.  Speech was not pressured.  Thought content was not tangential.     Lab Results  Component Value Date   WBC 7.3 07/11/2019   HGB 11.6 (L) 07/11/2019   HCT 36.3 07/11/2019   PLT 231 07/11/2019   GLUCOSE 129 (H) 07/11/2019   ALT 41 07/06/2019   AST 29 07/06/2019   NA 133 (L) 07/11/2019   K 3.0 (L) 07/11/2019   CL 94 (L) 07/11/2019   CREATININE 0.63 07/11/2019   BUN <5 (L) 07/11/2019   CO2 25 07/11/2019    CT CHEST W CONTRAST  Result Date: 07/07/2019 CLINICAL DATA:  Liver mass.  Concern for hepatic metastasis EXAM: CT CHEST WITH CONTRAST TECHNIQUE: Multidetector CT imaging of the chest was performed during intravenous contrast administration. CONTRAST:  72mL OMNIPAQUE IOHEXOL 300 MG/ML  SOLN COMPARISON:  CT 07/02/2019 FINDINGS: Cardiovascular: No significant vascular findings. Normal heart size. No pericardial effusion. Mediastinum/Nodes: No axillary supraclavicular adenopathy. Multiple nodules within the thyroid gland measures 12 mm or less. This is not meet the size requirement for follow-up imaging. No mediastinal lymphadenopathy. Moderate to large hiatal hernia posterior to the LEFT heart. Lungs/Pleura: Linear atelectasis in the lingula and medial RIGHT middle lobe. No suspicious nodularity. Upper Abdomen: Several low-density suspicious lesions in the liver again demonstrated. Inflammation potential mass in the pancreatic head again noted. No interval change from 07/02/2019. Musculoskeletal: No aggressive osseous lesion. IMPRESSION: 1. No evidence of thoracic metastasis. 2. Low-density lesions in the liver concerning for metastasis. See recent  abdomen CT 07/02/2019. 3. Inflammation head of pancreas with concern  for underlying neoplasm. Recommend appropriate tumor biomarkers. Electronically Signed   By: Suzy Bouchard M.D.   On: 07/07/2019 08:23   CT Abdomen Pelvis W Contrast  Result Date: 07/02/2019 CLINICAL DATA:  Abdominal pain with nausea and vomiting EXAM: CT ABDOMEN AND PELVIS WITH CONTRAST TECHNIQUE: Multidetector CT imaging of the abdomen and pelvis was performed using the standard protocol following bolus administration of intravenous contrast. CONTRAST:  187mL OMNIPAQUE IOHEXOL 300 MG/ML  SOLN COMPARISON:  CT 04/26/2004 FINDINGS: Lower chest: Lung bases demonstrate subsegmental atelectasis at the lingula, right middle lobe and inferior right upper lobe. No acute consolidation or pleural effusion. The heart size is within normal limits. Irregular moderate hiatal hernia with wall thickening. This is increased compared to prior. Hepatobiliary: Scattered hypodense liver lesions, some of which were present on comparison study. Multiple new ill-defined hypodense liver masses concerning for metastatic disease. Largest suspicious lesion is seen in the right hepatic dome and measures 2.5 cm. Multiple calcified gallstones. No biliary dilatation. Pancreas: Generalized inflammatory changes about the pancreas. Development of mild ductal dilatation. Ill-defined hypodense mass at the head of the pancreas measuring approximately 3.4 cm transverse by 2.2 cm AP by 3 cm craniocaudad. Spleen: Normal in size without focal abnormality. Adrenals/Urinary Tract: Adrenal glands are unremarkable. Kidneys are normal, without renal calculi, focal lesion, or hydronephrosis. Bladder is unremarkable. Stomach/Bowel: No dilated small bowel. Negative for colon wall thickening. Negative appendix. Vascular/Lymphatic: Nonaneurysmal aorta. Hazy appearance of mesenteric root with subcentimeter nodes. Multiple small nodes or punctate nodules in the right lower quadrant/right gutter. Hypoenhancement of the distal splenic vein and superior  mesenteric vein. Reproductive: Markedly enlarged uterus with multiple masses, increased compared to prior. Other: Negative for free air or significant ascites. Anterior abdominal wall laxity. Small supraumbilical fat containing ventral hernia Musculoskeletal: No acute or suspicious osseous abnormality. IMPRESSION: 1. Generalized pancreatic inflammation. There is an ill-defined hypodense suspicious mass at the head of the pancreas measuring 3.4 x 2.2 x 3 cm. 2. Multiple ill-defined hypodense liver masses, consistent with metastatic disease. 3. Hypoenhancement of the splenic vein and superior mesenteric veins near their confluence, potentially occluded. 4. Multiple punctate nodules or small nodes in the right lower quadrant/gutter, cannot exclude metastatic disease 5. Moderate irregular hiatal hernia with some wall thickening, could be correlated with endoscopy 6. Bulky enlargement of the uterus with multiple large masses, increased compared to prior. Electronically Signed   By: Donavan Foil M.D.   On: 07/02/2019 22:17   US BIOPSY (LIVER)  Result Date: 07/07/2019 INDICATION: Concern for metastatic pancreatic cancer. Please perform ultrasound-guided liver lesion biopsy for tissue diagnostic purposes. EXAM: ULTRASOUND GUIDED LIVER LESION BIOPSY COMPARISON:  CT abdomen and pelvis-07/02/2019 MEDICATIONS: None ANESTHESIA/SEDATION: Fentanyl 100 mcg IV; Versed 2 mg IV Total Moderate Sedation time:  17 Minutes. The patient's level of consciousness and vital signs were monitored continuously by radiology nursing throughout the procedure under my direct supervision. COMPLICATIONS: None immediate. PROCEDURE: Informed written consent was obtained from the patient after a discussion of the risks, benefits and alternatives to treatment. The patient understands and consents the procedure. A timeout was performed prior to the initiation of the procedure. Ultrasound scanning was performed of the right upper abdominal quadrant  demonstrates an ill-defined approximately 2.9 x 3.1 x 2.2 cm hypoechoic nodule/mass within the dome of the right lobe of the liver correlating with the indeterminate mass seen on preceding abdominal CT image 15, series 3. Note, imaging was degraded secondary  to patient body habitus well as well as the lesion being located within the dome of the liver with associated interposition of lung. The procedure was planned. The right upper abdominal quadrant was prepped and draped in the usual sterile fashion. The overlying soft tissues were anesthetized with 1% lidocaine with epinephrine. A 17 gauge, 6.8 cm co-axial needle was advanced into a peripheral aspect of the lesion. This was followed by 6 core biopsies with an 18 gauge core device under direct ultrasound guidance. The coaxial needle tract was embolized with a small amount of Gel-Foam slurry and superficial hemostasis was obtained with manual compression. Post procedural scanning was negative for definitive area of hemorrhage or additional complication. A dressing was placed. The patient tolerated the procedure well without immediate post procedural complication. IMPRESSION: Technically challenging though ultimately successful ultrasound guided core needle biopsy of indeterminate mass within the dome of the right lobe of the liver. Electronically Signed   By: Sandi Mariscal M.D.   On: 07/07/2019 13:34   DG Abd 2 Views  Result Date: 07/08/2019 CLINICAL DATA:  Abdominal pain and distension x3 days. EXAM: ABDOMEN - 2 VIEW COMPARISON:  None. FINDINGS: The bowel gas pattern is normal. A moderate to marked amount of stool is seen within the ascending colon. There is no evidence of free air. No radio-opaque calculi or other significant radiographic abnormality is seen. A radiopaque tubal ligation clip is seen within the pelvis on the right. An additional tubal ligation clip is seen overlying the superior aspect of the sacrum on the left. A 4 mm focal opacity is seen  overlying the symphysis pubis on the left. IMPRESSION: 1. Normal bowel gas pattern. No evidence of free air. Electronically Signed   By: Virgina Norfolk M.D.   On: 07/08/2019 16:27   DG Abd Portable 1V  Result Date: 07/10/2019 CLINICAL DATA:  Abdominal pain EXAM: PORTABLE ABDOMEN - 1 VIEW COMPARISON:  07/08/2019 FINDINGS: Bowel gas pattern remains unremarkable. Stool burden is overall mild with greater burden within the ascending colon. Tubal ligation clips noted. IMPRESSION: Normal bowel gas pattern. Electronically Signed   By: Macy Mis M.D.   On: 07/10/2019 13:20     CT CHEST W CONTRAST  Result Date: 07/07/2019 CLINICAL DATA:  Liver mass.  Concern for hepatic metastasis EXAM: CT CHEST WITH CONTRAST TECHNIQUE: Multidetector CT imaging of the chest was performed during intravenous contrast administration. CONTRAST:  24mL OMNIPAQUE IOHEXOL 300 MG/ML  SOLN COMPARISON:  CT 07/02/2019 FINDINGS: Cardiovascular: No significant vascular findings. Normal heart size. No pericardial effusion. Mediastinum/Nodes: No axillary supraclavicular adenopathy. Multiple nodules within the thyroid gland measures 12 mm or less. This is not meet the size requirement for follow-up imaging. No mediastinal lymphadenopathy. Moderate to large hiatal hernia posterior to the LEFT heart. Lungs/Pleura: Linear atelectasis in the lingula and medial RIGHT middle lobe. No suspicious nodularity. Upper Abdomen: Several low-density suspicious lesions in the liver again demonstrated. Inflammation potential mass in the pancreatic head again noted. No interval change from 07/02/2019. Musculoskeletal: No aggressive osseous lesion. IMPRESSION: 1. No evidence of thoracic metastasis. 2. Low-density lesions in the liver concerning for metastasis. See recent abdomen CT 07/02/2019. 3. Inflammation head of pancreas with concern for underlying neoplasm. Recommend appropriate tumor biomarkers. Electronically Signed   By: Suzy Bouchard M.D.   On:  07/07/2019 08:23   CT Abdomen Pelvis W Contrast  Result Date: 07/02/2019 CLINICAL DATA:  Abdominal pain with nausea and vomiting EXAM: CT ABDOMEN AND PELVIS WITH CONTRAST TECHNIQUE: Multidetector CT imaging  of the abdomen and pelvis was performed using the standard protocol following bolus administration of intravenous contrast. CONTRAST:  125mL OMNIPAQUE IOHEXOL 300 MG/ML  SOLN COMPARISON:  CT 04/26/2004 FINDINGS: Lower chest: Lung bases demonstrate subsegmental atelectasis at the lingula, right middle lobe and inferior right upper lobe. No acute consolidation or pleural effusion. The heart size is within normal limits. Irregular moderate hiatal hernia with wall thickening. This is increased compared to prior. Hepatobiliary: Scattered hypodense liver lesions, some of which were present on comparison study. Multiple new ill-defined hypodense liver masses concerning for metastatic disease. Largest suspicious lesion is seen in the right hepatic dome and measures 2.5 cm. Multiple calcified gallstones. No biliary dilatation. Pancreas: Generalized inflammatory changes about the pancreas. Development of mild ductal dilatation. Ill-defined hypodense mass at the head of the pancreas measuring approximately 3.4 cm transverse by 2.2 cm AP by 3 cm craniocaudad. Spleen: Normal in size without focal abnormality. Adrenals/Urinary Tract: Adrenal glands are unremarkable. Kidneys are normal, without renal calculi, focal lesion, or hydronephrosis. Bladder is unremarkable. Stomach/Bowel: No dilated small bowel. Negative for colon wall thickening. Negative appendix. Vascular/Lymphatic: Nonaneurysmal aorta. Hazy appearance of mesenteric root with subcentimeter nodes. Multiple small nodes or punctate nodules in the right lower quadrant/right gutter. Hypoenhancement of the distal splenic vein and superior mesenteric vein. Reproductive: Markedly enlarged uterus with multiple masses, increased compared to prior. Other: Negative for  free air or significant ascites. Anterior abdominal wall laxity. Small supraumbilical fat containing ventral hernia Musculoskeletal: No acute or suspicious osseous abnormality. IMPRESSION: 1. Generalized pancreatic inflammation. There is an ill-defined hypodense suspicious mass at the head of the pancreas measuring 3.4 x 2.2 x 3 cm. 2. Multiple ill-defined hypodense liver masses, consistent with metastatic disease. 3. Hypoenhancement of the splenic vein and superior mesenteric veins near their confluence, potentially occluded. 4. Multiple punctate nodules or small nodes in the right lower quadrant/gutter, cannot exclude metastatic disease 5. Moderate irregular hiatal hernia with some wall thickening, could be correlated with endoscopy 6. Bulky enlargement of the uterus with multiple large masses, increased compared to prior. Electronically Signed   By: Donavan Foil M.D.   On: 07/02/2019 22:17   US BIOPSY (LIVER)  Result Date: 07/07/2019 INDICATION: Concern for metastatic pancreatic cancer. Please perform ultrasound-guided liver lesion biopsy for tissue diagnostic purposes. EXAM: ULTRASOUND GUIDED LIVER LESION BIOPSY COMPARISON:  CT abdomen and pelvis-07/02/2019 MEDICATIONS: None ANESTHESIA/SEDATION: Fentanyl 100 mcg IV; Versed 2 mg IV Total Moderate Sedation time:  17 Minutes. The patient's level of consciousness and vital signs were monitored continuously by radiology nursing throughout the procedure under my direct supervision. COMPLICATIONS: None immediate. PROCEDURE: Informed written consent was obtained from the patient after a discussion of the risks, benefits and alternatives to treatment. The patient understands and consents the procedure. A timeout was performed prior to the initiation of the procedure. Ultrasound scanning was performed of the right upper abdominal quadrant demonstrates an ill-defined approximately 2.9 x 3.1 x 2.2 cm hypoechoic nodule/mass within the dome of the right lobe of the liver  correlating with the indeterminate mass seen on preceding abdominal CT image 15, series 3. Note, imaging was degraded secondary to patient body habitus well as well as the lesion being located within the dome of the liver with associated interposition of lung. The procedure was planned. The right upper abdominal quadrant was prepped and draped in the usual sterile fashion. The overlying soft tissues were anesthetized with 1% lidocaine with epinephrine. A 17 gauge, 6.8 cm co-axial needle was advanced into a  peripheral aspect of the lesion. This was followed by 6 core biopsies with an 18 gauge core device under direct ultrasound guidance. The coaxial needle tract was embolized with a small amount of Gel-Foam slurry and superficial hemostasis was obtained with manual compression. Post procedural scanning was negative for definitive area of hemorrhage or additional complication. A dressing was placed. The patient tolerated the procedure well without immediate post procedural complication. IMPRESSION: Technically challenging though ultimately successful ultrasound guided core needle biopsy of indeterminate mass within the dome of the right lobe of the liver. Electronically Signed   By: Sandi Mariscal M.D.   On: 07/07/2019 13:34   DG Abd 2 Views  Result Date: 07/08/2019 CLINICAL DATA:  Abdominal pain and distension x3 days. EXAM: ABDOMEN - 2 VIEW COMPARISON:  None. FINDINGS: The bowel gas pattern is normal. A moderate to marked amount of stool is seen within the ascending colon. There is no evidence of free air. No radio-opaque calculi or other significant radiographic abnormality is seen. A radiopaque tubal ligation clip is seen within the pelvis on the right. An additional tubal ligation clip is seen overlying the superior aspect of the sacrum on the left. A 4 mm focal opacity is seen overlying the symphysis pubis on the left. IMPRESSION: 1. Normal bowel gas pattern. No evidence of free air. Electronically Signed   By:  Virgina Norfolk M.D.   On: 07/08/2019 16:27   DG Abd Portable 1V  Result Date: 07/10/2019 CLINICAL DATA:  Abdominal pain EXAM: PORTABLE ABDOMEN - 1 VIEW COMPARISON:  07/08/2019 FINDINGS: Bowel gas pattern remains unremarkable. Stool burden is overall mild with greater burden within the ascending colon. Tubal ligation clips noted. IMPRESSION: Normal bowel gas pattern. Electronically Signed   By: Macy Mis M.D.   On: 07/10/2019 13:20    Pathology:  SURGICAL PATHOLOGY  CASE: [id number]  PATIENT: [place]  Surgical Pathology Report   Clinical History: concern for metastatic pancreatic cancer, post US  guided liver lesion biopsy (cm)   FINAL MICROSCOPIC DIAGNOSIS:   A. LIVER, NEEDLE CORE BIOPSY:  - Adenocarcinoma involving the liver parenchyma. See comment   COMMENT:   Immunohistochemical stains show that the tumor cells are positive for  CK7. Immunostains for CK20, CDX2, arginase and Glypican-3 are negative.  Immunostain for HepPar shows weak staining within tumor cells, likely  nonspecific. The immunohistochemical profile is compatible with  clinical impression of primary pancreatic adenocarcinoma. Dr. Pascal Lux was  paged on 07/09/2019.   Assessment and Plan:  1.  Metastatic pancreatic adenocarcinoma  -CT of the abdomen pelvis on 07/02/2019 showed a 3.4 x 2.2 x 3   cm mass in the head of the pancreas with multiple ill-defined   hypodense liver masses.  -CT of the chest on 07/07/2019 showed no evidence of thoracic   metastasis.  -Ultrasound guided liver biopsy on 07/07/2019 demonstrated   metastatic pancreatic adenocarcinoma. 2.  Abdominal pain, nausea, vomiting secondary to pancreatic adenocarcinoma 3.  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  Chronicity, now with mild microcytic anemia 4.  Hypokalemia 5.  Uterine fibroids  Ms. Costabile is a 64 year old female with newly diagnosed pancreatic adenocarcinoma.  She presented with a 23-month history of  anorexia, weight loss, nausea, vomiting, fatigue, and abdominal pain.  She was found to have a pancreatic mass and liver metastases.  Biopsy one of her liver lesions consistent with pancreatic adenocarcinoma.  She has ongoing, poorly controlled nausea and vomiting as well as abdominal discomfort.  Recommendations: 1.  Discussed  the diagnosis, prognosis, and treatment options.  Discussed with the patient she has metastatic disease that is not curable but treatable.  She seems to be a good candidate for chemotherapy as an outpatient.  Discussed proceeding with a Port-A-Cath placement in anticipation of beginning chemotherapy in the near future.  Order has been placed for Port-A-Cath placement by interventional radiology.  They anticipate that they will be able to do this on Monday, 07/14/2019. 2.  We will start the patient on OxyContin 10 mg twice a day and oxycodone 5 mg every 4 hours as needed for breakthrough pain. 3.  She currently has Zofran and Phenergan ordered as needed.  Nausea still not well controlled.  She is somewhat anxious over her new diagnosis.  I will start her on Ativan 0.5 mg p.o./sublingual every 6 hours as needed for nausea and vomiting. 4.  Check CA 19.9 in the morning.  Thank you for this referral.   Mikey Bussing, DNP, AGPCNP-BC, AOCNP  Ms. Sukhu was interviewed and examined.  I reviewed the CT images.  I discussed the diagnosis of metastatic pancreas cancer and treatment options with her.  She understands no therapy will be curative.  We will recommend gemcitabine/Abraxane or FOLFIRINOX chemotherapy depending on her performance status over the next 1-2 weeks.  We adjusted the narcotic pain and nausea regimens today.  Port-A-Cath placement in anticipation of beginning chemotherapy within the next-1-2 weeks.  I will check on her over the weekend.  Outpatient follow-up will be scheduled at the cancer center.

## 2019-07-11 NOTE — Consult Note (Signed)
Chief Complaint: Patient was seen in consultation today for port placement.  Referring Physician(s): Mikey Bussing, NP  Supervising Physician: Markus Daft  Patient Status: [facility] - In-pt  History of Present Illness: Rachel Vasquez is a 64 y.o. female with a past medical history significant for GERD, anemia and recently diagnosed metastatic pancreatic cancer followed by oncology with plans to begin palliative chemotherapy - IR has been asked to place a port for durable venous access.  Rachel Vasquez is sleeping upon entry to her room but arouses easily, she states she is very tired but is agreeable to discuss port placement. She states she has known others with ports so she is somewhat familiar with them. She states understanding of the requested procedure and is agreeable to proceed.  Past Medical History:  Diagnosis Date  . GERD (gastroesophageal reflux disease)   . Hiatal hernia   . Liver lesion   . Medical history non-contributory   . Pancreatic mass     History reviewed. No pertinent surgical history.  Allergies: Patient has no known allergies.  Medications: Prior to Admission medications   Medication Sig Start Date End Date Taking? Authorizing Provider  Multiple Vitamins-Minerals (MULTIVITAMIN WITH MINERALS) tablet Take 1 tablet by mouth daily.   Yes [provider]  ondansetron (ZOFRAN-ODT) 4 MG disintegrating tablet Take 4 mg by mouth every 8 (eight) hours as needed for nausea or vomiting.  07/02/19  Yes [provider]  pantoprazole (PROTONIX) 40 MG tablet Take 40 mg by mouth daily. 07/02/19  Yes [provider]     Family History  Problem Relation Age of Onset  . Pancreatic cancer Father     Social History   Socioeconomic History  . Marital status: Single    Spouse name: Not on file  . Number of children: Not on file  . Years of education: Not on file  . Highest education level: Not on file  Occupational History  . Not on file    Tobacco Use  . Smoking status: Never Smoker  . Smokeless tobacco: Never Used  Substance and Sexual Activity  . Alcohol use: Yes  . Drug use: Never  . Sexual activity: Yes  Other Topics Concern  . Not on file  Social History Narrative  . Not on file   Social Determinants of Health   Financial Resource Strain:   . Difficulty of Paying Living Expenses: Not on file  Food Insecurity:   . Worried About Charity fundraiser in the Last Year: Not on file  . Ran Out of Food in the Last Year: Not on file  Transportation Needs:   . Lack of Transportation (Medical): Not on file  . Lack of Transportation (Non-Medical): Not on file  Physical Activity:   . Days of Exercise per Week: Not on file  . Minutes of Exercise per Session: Not on file  Stress:   . Feeling of Stress : Not on file  Social Connections:   . Frequency of Communication with Friends and Family: Not on file  . Frequency of Social Gatherings with Friends and Family: Not on file  . Attends Religious Services: Not on file  . Active Member of Clubs or Organizations: Not on file  . Attends Archivist Meetings: Not on file  . Marital Status: Not on file     Review of Systems: A 12 point ROS discussed and pertinent positives are indicated in the HPI above.  All other systems are negative.  Review of Systems  Constitutional: Positive for fatigue. Negative for chills and fever.  Respiratory: Negative for cough and shortness of breath.   Cardiovascular: Negative for chest pain.  Gastrointestinal: Positive for nausea. Negative for abdominal pain, diarrhea and vomiting.  Musculoskeletal: Negative for back pain.  Neurological: Negative for dizziness and headaches.    Vital Signs: BP (!) 145/80   Pulse 90   Temp (!) 97.4 F (36.3 C) (Oral)   Resp 17   Ht 5\' 7"  (1.702 m)   Wt 244 lb 11.2 oz (111 kg)   SpO2 93%   BMI 38.33 kg/m   Physical Exam Vitals reviewed.  Constitutional:      General: She is not in  acute distress.    Comments: Somnolent but arouses easily, engages in discussion appropriately.  HENT:     Head: Normocephalic.     Mouth/Throat:     Mouth: Mucous membranes are moist.     Pharynx: Oropharynx is clear. No oropharyngeal exudate or posterior oropharyngeal erythema.  Cardiovascular:     Rate and Rhythm: Normal rate and regular rhythm.  Pulmonary:     Effort: Pulmonary effort is normal.     Breath sounds: Normal breath sounds.  Abdominal:     General: There is no distension.     Palpations: Abdomen is soft.     Tenderness: There is no abdominal tenderness.  Skin:    General: Skin is warm and dry.  Neurological:     Mental Status: She is oriented to person, place, and time.  Psychiatric:        Mood and Affect: Mood normal.        Behavior: Behavior normal.        Thought Content: Thought content normal.        Judgment: Judgment normal.      MD Evaluation Airway: WNL Heart: WNL Abdomen: WNL Chest/ Lungs: WNL ASA  Classification: 3 Mallampati/Airway Score: Two   Imaging: CT CHEST W CONTRAST  Result Date: 07/07/2019 CLINICAL DATA:  Liver mass.  Concern for hepatic metastasis EXAM: CT CHEST WITH CONTRAST TECHNIQUE: Multidetector CT imaging of the chest was performed during intravenous contrast administration. CONTRAST:  57mL OMNIPAQUE IOHEXOL 300 MG/ML  SOLN COMPARISON:  CT 07/02/2019 FINDINGS: Cardiovascular: No significant vascular findings. Normal heart size. No pericardial effusion. Mediastinum/Nodes: No axillary supraclavicular adenopathy. Multiple nodules within the thyroid gland measures 12 mm or less. This is not meet the size requirement for follow-up imaging. No mediastinal lymphadenopathy. Moderate to large hiatal hernia posterior to the LEFT heart. Lungs/Pleura: Linear atelectasis in the lingula and medial RIGHT middle lobe. No suspicious nodularity. Upper Abdomen: Several low-density suspicious lesions in the liver again demonstrated. Inflammation  potential mass in the pancreatic head again noted. No interval change from 07/02/2019. Musculoskeletal: No aggressive osseous lesion. IMPRESSION: 1. No evidence of thoracic metastasis. 2. Low-density lesions in the liver concerning for metastasis. See recent abdomen CT 07/02/2019. 3. Inflammation head of pancreas with concern for underlying neoplasm. Recommend appropriate tumor biomarkers. Electronically Signed   By: Suzy Bouchard M.D.   On: 07/07/2019 08:23   CT Abdomen Pelvis W Contrast  Result Date: 07/02/2019 CLINICAL DATA:  Abdominal pain with nausea and vomiting EXAM: CT ABDOMEN AND PELVIS WITH CONTRAST TECHNIQUE: Multidetector CT imaging of the abdomen and pelvis was performed using the standard protocol following bolus administration of intravenous contrast. CONTRAST:  174mL OMNIPAQUE IOHEXOL 300 MG/ML  SOLN COMPARISON:  CT 04/26/2004 FINDINGS: Lower chest: Lung bases demonstrate subsegmental atelectasis at the lingula, right middle  lobe and inferior right upper lobe. No acute consolidation or pleural effusion. The heart size is within normal limits. Irregular moderate hiatal hernia with wall thickening. This is increased compared to prior. Hepatobiliary: Scattered hypodense liver lesions, some of which were present on comparison study. Multiple new ill-defined hypodense liver masses concerning for metastatic disease. Largest suspicious lesion is seen in the right hepatic dome and measures 2.5 cm. Multiple calcified gallstones. No biliary dilatation. Pancreas: Generalized inflammatory changes about the pancreas. Development of mild ductal dilatation. Ill-defined hypodense mass at the head of the pancreas measuring approximately 3.4 cm transverse by 2.2 cm AP by 3 cm craniocaudad. Spleen: Normal in size without focal abnormality. Adrenals/Urinary Tract: Adrenal glands are unremarkable. Kidneys are normal, without renal calculi, focal lesion, or hydronephrosis. Bladder is unremarkable. Stomach/Bowel:  No dilated small bowel. Negative for colon wall thickening. Negative appendix. Vascular/Lymphatic: Nonaneurysmal aorta. Hazy appearance of mesenteric root with subcentimeter nodes. Multiple small nodes or punctate nodules in the right lower quadrant/right gutter. Hypoenhancement of the distal splenic vein and superior mesenteric vein. Reproductive: Markedly enlarged uterus with multiple masses, increased compared to prior. Other: Negative for free air or significant ascites. Anterior abdominal wall laxity. Small supraumbilical fat containing ventral hernia Musculoskeletal: No acute or suspicious osseous abnormality. IMPRESSION: 1. Generalized pancreatic inflammation. There is an ill-defined hypodense suspicious mass at the head of the pancreas measuring 3.4 x 2.2 x 3 cm. 2. Multiple ill-defined hypodense liver masses, consistent with metastatic disease. 3. Hypoenhancement of the splenic vein and superior mesenteric veins near their confluence, potentially occluded. 4. Multiple punctate nodules or small nodes in the right lower quadrant/gutter, cannot exclude metastatic disease 5. Moderate irregular hiatal hernia with some wall thickening, could be correlated with endoscopy 6. Bulky enlargement of the uterus with multiple large masses, increased compared to prior. Electronically Signed   By: Donavan Foil M.D.   On: 07/02/2019 22:17   US BIOPSY (LIVER)  Result Date: 07/07/2019 INDICATION: Concern for metastatic pancreatic cancer. Please perform ultrasound-guided liver lesion biopsy for tissue diagnostic purposes. EXAM: ULTRASOUND GUIDED LIVER LESION BIOPSY COMPARISON:  CT abdomen and pelvis-07/02/2019 MEDICATIONS: None ANESTHESIA/SEDATION: Fentanyl 100 mcg IV; Versed 2 mg IV Total Moderate Sedation time:  17 Minutes. The patient's level of consciousness and vital signs were monitored continuously by radiology nursing throughout the procedure under my direct supervision. COMPLICATIONS: None immediate. PROCEDURE:  Informed written consent was obtained from the patient after a discussion of the risks, benefits and alternatives to treatment. The patient understands and consents the procedure. A timeout was performed prior to the initiation of the procedure. Ultrasound scanning was performed of the right upper abdominal quadrant demonstrates an ill-defined approximately 2.9 x 3.1 x 2.2 cm hypoechoic nodule/mass within the dome of the right lobe of the liver correlating with the indeterminate mass seen on preceding abdominal CT image 15, series 3. Note, imaging was degraded secondary to patient body habitus well as well as the lesion being located within the dome of the liver with associated interposition of lung. The procedure was planned. The right upper abdominal quadrant was prepped and draped in the usual sterile fashion. The overlying soft tissues were anesthetized with 1% lidocaine with epinephrine. A 17 gauge, 6.8 cm co-axial needle was advanced into a peripheral aspect of the lesion. This was followed by 6 core biopsies with an 18 gauge core device under direct ultrasound guidance. The coaxial needle tract was embolized with a small amount of Gel-Foam slurry and superficial hemostasis was obtained with manual compression.  Post procedural scanning was negative for definitive area of hemorrhage or additional complication. A dressing was placed. The patient tolerated the procedure well without immediate post procedural complication. IMPRESSION: Technically challenging though ultimately successful ultrasound guided core needle biopsy of indeterminate mass within the dome of the right lobe of the liver. Electronically Signed   By: Sandi Mariscal M.D.   On: 07/07/2019 13:34   DG Abd 2 Views  Result Date: 07/08/2019 CLINICAL DATA:  Abdominal pain and distension x3 days. EXAM: ABDOMEN - 2 VIEW COMPARISON:  None. FINDINGS: The bowel gas pattern is normal. A moderate to marked amount of stool is seen within the ascending colon.  There is no evidence of free air. No radio-opaque calculi or other significant radiographic abnormality is seen. A radiopaque tubal ligation clip is seen within the pelvis on the right. An additional tubal ligation clip is seen overlying the superior aspect of the sacrum on the left. A 4 mm focal opacity is seen overlying the symphysis pubis on the left. IMPRESSION: 1. Normal bowel gas pattern. No evidence of free air. Electronically Signed   By: Virgina Norfolk M.D.   On: 07/08/2019 16:27   DG Abd Portable 1V  Result Date: 07/10/2019 CLINICAL DATA:  Abdominal pain EXAM: PORTABLE ABDOMEN - 1 VIEW COMPARISON:  07/08/2019 FINDINGS: Bowel gas pattern remains unremarkable. Stool burden is overall mild with greater burden within the ascending colon. Tubal ligation clips noted. IMPRESSION: Normal bowel gas pattern. Electronically Signed   By: Macy Mis M.D.   On: 07/10/2019 13:20    Labs:  CBC: Recent Labs    07/06/19 0119 07/08/19 0548 07/10/19 0813 07/11/19 0550  WBC 5.6 6.2 6.1 7.3  HGB 10.8* 11.6* 11.8* 11.6*  HCT 34.8* 37.6 36.6 36.3  PLT 199 250 201 231    COAGS: Recent Labs    07/07/19 0050  INR 1.2    BMP: Recent Labs    07/06/19 0119 07/08/19 0548 07/10/19 0813 07/11/19 0550  NA 135 138 134* 133*  K 3.5 3.2* 3.1* 3.0*  CL 100 99 95* 94*  CO2 22 23 24 25   GLUCOSE 112* 118* 135* 129*  BUN <5* <5* <5* <5*  CALCIUM 8.1* 9.1 8.8* 8.8*  CREATININE 0.69 0.74 0.62 0.63  GFRNONAA >60 >60 >60 >60  GFRAA >60 >60 >60 >60    LIVER FUNCTION TESTS: Recent Labs    07/03/19 0522 07/04/19 1107 07/05/19 0459 07/06/19 0119  BILITOT 1.0 1.3* 1.0 1.1  AST 22 44* 41 29  ALT 24 45* 51* 41  ALKPHOS 73 90 97 92  PROT 6.9 6.4* 6.8 6.1*  ALBUMIN 3.0* 2.7* 2.9* 2.6*    TUMOR MARKERS: No results for input(s): AFPTM, CEA, CA199, CHROMGRNA in the last 8760 hours.  Assessment and Plan:  64 y/o F with recently diagnosed metastatic pancreatic cancer planned for outpatient  follow up with oncology who plans to begin palliative chemotherapy - IR has been asked to place a port for durable venous access.  Will tentatively plan for inpatient port placement on Monday 1/11 in IR pending any emergent procedures - if she is to be discharged over the weekend the port can be placed as an outpatient, a new order will need to be placed for this. I have discussed the above with Mikey Bussing, NP who is agreeable to this plan.  Patient to be NPO at midnight on 1/11, lovenox to be held after 1/10, AM labs for Monday pending.   Risks and benefits of image-guided  Port-a-catheter placement were discussed with the patient including, but not limited to bleeding, infection, pneumothorax, or fibrin sheath development and need for additional procedures.  All of the patient's questions were answered, patient is agreeable to proceed.  Consent signed and in chart.  Thank you for this interesting consult.  I greatly enjoyed meeting Rachel Vasquez and look forward to participating in their care.  A copy of this report was sent to the requesting provider on this date.  Electronically Signed: Joaquim Nam, PA-C 07/11/2019, 2:44 PM   I spent a total of 20 Minutes in face to face in clinical consultation, greater than 50% of which was counseling/coordinating care for port placement.

## 2019-07-11 NOTE — Progress Notes (Signed)
[facility]  PROGRESS NOTE  Rachel Vasquez [id number] DOB: 03-12-1956 DOA: 07/02/2019 PCP: Patient, [name] Per Admit date - 07/02/2019   Admitting Physician Geradine Girt, DO  Outpatient Primary MD for the patient is Patient, [name] Per  LOS - 8 Brief Narrative   Rachel Vasquez is a 64 y.o. year old female with no significant medical history who presented on 07/02/2019 with progressive nausea, vomiting, abdominal pain weakness and generalized malaise over the course of 3 months.  Patient was undergoing GI work-up with plan ultrasound and EGD to be performed before worsening symptoms caused her to present to the ED.  CT abdomen showed generalized pancreatic inflammation as well as several ill-defined hypodense masses of the pancreatic head and liver concerning for metastatic disease; as well as bulky enlargement of the uterus with multiple large masses increased compared to prior.  Lipase 92, as well as pancreatic inflammation consistent with pancreatitis with no evidence of biliary obstruction likely related to pancreatic masses mentioned above.    During this hospitalization, patient underwent IR guided biopsy on 1/4 and is currently awaiting pathology results.  Patient has had diminished p.o. intake related to ongoing nausea/intermittent vomiting and abdominal pain is currently being monitored to determine necessity for alternative means of nutrition in addition to pain control  Subjective  Really no p.o. intake today due to persistent nausea/intermittent vomiting/abdominal pain and belching.  Agreeable to trying suppository/enema  A & P   Stage IV Pancreatic adenocarcinoma with hepatic metastases Status post IR guided biopsy -Oncology consulted, discussed tentative prognosis with patient and plan for palliative treatment --outpatient oncology follow up arranged on discharge  Nausea, intermittent vomiting, abdominal pain secondary to pancreatitis, due to above Have have  conestat back in controlling the symptoms as she is not tolerating p.o.  ABD xr shows mild stool burden but no obstruction.  Need to optimize bowel regimen given patient is a IV pain medicine as well as p.o. -Low threshold to consider feeding tube -Adjusting narcotic pain regimen today: Adding OxyContin 10 mg twice daily, oxycodone 5 mg every 4 for breakthrough, IV for severe breakthrough. Currently on Zofran, with Phenergan for breakthrough nausea/vomiting -We will trial Dulcolax suppository, enema if no improvement -Gentle hydration with IV fluids -Scheduled IV Protonix twice daily  Hypokalemia/hypomagnesemia, due to above Very high risk for refeeding syndrome.  Prealbumin 6.  If continues to be able to tolerate p.o. will likely warrant core track/feeding tube postpyloric -check mg daily -check daily phos, BMP, mg -correct orally (magnesium), IV repletion of potassium given diminished p.o. - closely monitor for refeeding syndrome  Microcytic anemia Hemoglobin remained stable around 10-11 Could be related to possible malignancy Iron paroxysmal likely from malignancy -Daily CBC    Family Communication  : None at bedside  Code Status : Full code  Disposition Plan  : Needs continued inpatient stay for IV pain control and antiemetics, improve nutritional support given failure to thrive related to pancreatitis and metastatic pancreatic cancer, close monitoring of electrolytes with high risk for refeeding syndrome, electrolyte support  Consults  : GI, IR  Procedures  : IR guided biopsy, 1/4  DVT Prophylaxis  :  Lovenox   Lab Results  Component Value Date   PLT 231 07/11/2019    Diet :  Diet Order            Diet NPO time specified Except for: Sips with Meds  Diet effective midnight        DIET SOFT Room service appropriate?  Yes; Fluid consistency: Thin  Diet effective now               Inpatient Medications Scheduled Meds: . amLODipine  10 mg Oral Daily  . bisacodyl   10 mg Rectal Once  . enoxaparin (LOVENOX) injection  40 mg Subcutaneous Q24H  . feeding supplement (PRO-STAT SUGAR FREE 64)  30 mL Oral TID  . multivitamin with minerals  1 tablet Oral Daily  . oxyCODONE  10 mg Oral Q12H  . pantoprazole (PROTONIX) IV  40 mg Intravenous Q12H   Continuous Infusions: . sodium chloride 50 mL/hr at 07/10/19 2207  . potassium chloride     PRN Meds:.acetaminophen **OR** acetaminophen, bisacodyl, hydrALAZINE, LORazepam, morphine injection, ondansetron **OR** ondansetron (ZOFRAN) IV, oxyCODONE, polyethylene glycol, promethazine, simethicone, zolpidem  Antibiotics  :   Anti-infectives (From admission, onward)   None       Objective   Vitals:   07/10/19 1817 07/11/19 0016 07/11/19 0609 07/11/19 1106  BP: (!) 149/76 (!) 157/93 (!) 171/98 (!) 145/80  Pulse: 84 92 90   Resp: 18 16 17    Temp: 97.8 F (36.6 C) 98 F (36.7 C) (!) 97.4 F (36.3 C)   TempSrc: Oral Oral Oral   SpO2: 98% 99% 93%   Weight:      Height:        SpO2: 93 % O2 Flow Rate (L/min): 2 L/min  Wt Readings from Last 3 Encounters:  07/09/19 111 kg  06/30/19 112.5 kg     Intake/Output Summary (Last 24 hours) at 07/11/2019 1739 Last data filed at 07/11/2019 0300 Gross per 24 hour  Intake 1080 ml  Output --  Net 1080 ml    Physical Exam:  Awake Alert, Oriented X 3 Lying in bed uncomfortable, but no acute distress No new F.N deficits,  [place].AT, Symmetrical Chest wall movement on room air, Good air movement bilaterally, CTAB RRR,No Gallops,Rubs or new Murmurs,  Diminished B.Sounds, Abd Soft, No tenderness, No rebound, guarding or rigidity.   I have personally reviewed the following:   Data Reviewed:  CBC Recent Labs  Lab 07/06/19 0119 07/08/19 0548 07/10/19 0813 07/11/19 0550  WBC 5.6 6.2 6.1 7.3  HGB 10.8* 11.6* 11.8* 11.6*  HCT 34.8* 37.6 36.6 36.3  PLT 199 250 201 231  MCV 75.0* 74.0* 72.0* 71.7*  MCH 23.3* 22.8* 23.2* 22.9*  MCHC 31.0 30.9 32.2 32.0  RDW  14.6 14.4 14.3 14.4    Chemistries  Recent Labs  Lab 07/05/19 0459 07/06/19 0119 07/08/19 0548 07/10/19 0813 07/11/19 0550  NA 137 135 138 134* 133*  K 3.3* 3.5 3.2* 3.1* 3.0*  CL 101 100 99 95* 94*  CO2 26 22 23 24 25   GLUCOSE 120* 112* 118* 135* 129*  BUN <5* <5* <5* <5* <5*  CREATININE 0.75 0.69 0.74 0.62 0.63  CALCIUM 8.6* 8.1* 9.1 8.8* 8.8*  MG  --   --   --  1.6* 1.9  AST 41 29  --   --   --   ALT 51* 41  --   --   --   ALKPHOS 97 92  --   --   --   BILITOT 1.0 1.1  --   --   --    ------------------------------------------------------------------------------------------------------------------ No results for input(s): CHOL, HDL, LDLCALC, TRIG, CHOLHDL, LDLDIRECT in the last 72 hours.  Lab Results  Component Value Date   HGBA1C 6.5 (H) 07/03/2019   ------------------------------------------------------------------------------------------------------------------ No results for input(s): TSH, T4TOTAL, T3FREE, THYROIDAB in  the last 72 hours.  Invalid input(s): FREET3 ------------------------------------------------------------------------------------------------------------------ Recent Labs    07/10/19 0813  FERRITIN 341*  TIBC 203*  IRON 72    Coagulation profile Recent Labs  Lab 07/07/19 0050  INR 1.2    No results for input(s): DDIMER in the last 72 hours.  Cardiac Enzymes No results for input(s): CKMB, TROPONINI, MYOGLOBIN in the last 168 hours.  Invalid input(s): CK ------------------------------------------------------------------------------------------------------------------ No results found for: BNP  Micro Results Recent Results (from the past 240 hour(s))  SARS CORONAVIRUS 2 (TAT 6-24 HRS) Nasopharyngeal Nasopharyngeal Swab     Status: None   Collection Time: 07/02/19 11:40 PM   Specimen: Nasopharyngeal Swab  Result Value Ref Range Status   SARS Coronavirus 2 NEGATIVE NEGATIVE Final    Comment: (NOTE) SARS-CoV-2 target nucleic acids  are NOT DETECTED. The SARS-CoV-2 RNA is generally detectable in upper and lower respiratory specimens during the acute phase of infection. Negative results do not preclude SARS-CoV-2 infection, do not rule out co-infections with other pathogens, and should not be used as the sole basis for treatment or other patient management decisions. Negative results must be combined with clinical observations, patient history, and epidemiological information. The expected result is Negative. Fact Sheet for Patients: SugarRoll.be Fact Sheet for Healthcare Providers: https://www.woods-mathews.com/ This test is not yet approved or cleared by the [place] FDA and  has been authorized for detection and/or diagnosis of SARS-CoV-2 by FDA under an Emergency Use Authorization (EUA). This EUA will remain  in effect (meaning this test can be used) for the duration of the COVID-19 declaration under Section 56 4(b)(1) of the Act, 21 U.S.C. section 360bbb-3(b)(1), unless the authorization is terminated or revoked sooner. Performed at [facility], [place] [address] [place], [place] [address]     Radiology Reports CT CHEST W CONTRAST  Result Date: 07/07/2019 CLINICAL DATA:  Liver mass.  Concern for hepatic metastasis EXAM: CT CHEST WITH CONTRAST TECHNIQUE: Multidetector CT imaging of the chest was performed during intravenous contrast administration. CONTRAST:  75mL OMNIPAQUE IOHEXOL 300 MG/ML  SOLN COMPARISON:  CT 07/02/2019 FINDINGS: Cardiovascular: No significant vascular findings. Normal heart size. No pericardial effusion. Mediastinum/Nodes: No axillary supraclavicular adenopathy. Multiple nodules within the thyroid gland measures 12 mm or less. This is not meet the size requirement for follow-up imaging. No mediastinal lymphadenopathy. Moderate to large hiatal hernia posterior to the LEFT heart. Lungs/Pleura: Linear atelectasis in the lingula and medial RIGHT  middle lobe. No suspicious nodularity. Upper Abdomen: Several low-density suspicious lesions in the liver again demonstrated. Inflammation potential mass in the pancreatic head again noted. No interval change from 07/02/2019. Musculoskeletal: No aggressive osseous lesion. IMPRESSION: 1. No evidence of thoracic metastasis. 2. Low-density lesions in the liver concerning for metastasis. See recent abdomen CT 07/02/2019. 3. Inflammation head of pancreas with concern for underlying neoplasm. Recommend appropriate tumor biomarkers. Electronically Signed   By: Suzy Bouchard M.D.   On: 07/07/2019 08:23   CT Abdomen Pelvis W Contrast  Result Date: 07/02/2019 CLINICAL DATA:  Abdominal pain with nausea and vomiting EXAM: CT ABDOMEN AND PELVIS WITH CONTRAST TECHNIQUE: Multidetector CT imaging of the abdomen and pelvis was performed using the standard protocol following bolus administration of intravenous contrast. CONTRAST:  167mL OMNIPAQUE IOHEXOL 300 MG/ML  SOLN COMPARISON:  CT 04/26/2004 FINDINGS: Lower chest: Lung bases demonstrate subsegmental atelectasis at the lingula, right middle lobe and inferior right upper lobe. No acute consolidation or pleural effusion. The heart size is within normal limits. Irregular moderate hiatal  hernia with wall thickening. This is increased compared to prior. Hepatobiliary: Scattered hypodense liver lesions, some of which were present on comparison study. Multiple new ill-defined hypodense liver masses concerning for metastatic disease. Largest suspicious lesion is seen in the right hepatic dome and measures 2.5 cm. Multiple calcified gallstones. No biliary dilatation. Pancreas: Generalized inflammatory changes about the pancreas. Development of mild ductal dilatation. Ill-defined hypodense mass at the head of the pancreas measuring approximately 3.4 cm transverse by 2.2 cm AP by 3 cm craniocaudad. Spleen: Normal in size without focal abnormality. Adrenals/Urinary Tract: Adrenal  glands are unremarkable. Kidneys are normal, without renal calculi, focal lesion, or hydronephrosis. Bladder is unremarkable. Stomach/Bowel: No dilated small bowel. Negative for colon wall thickening. Negative appendix. Vascular/Lymphatic: Nonaneurysmal aorta. Hazy appearance of mesenteric root with subcentimeter nodes. Multiple small nodes or punctate nodules in the right lower quadrant/right gutter. Hypoenhancement of the distal splenic vein and superior mesenteric vein. Reproductive: Markedly enlarged uterus with multiple masses, increased compared to prior. Other: Negative for free air or significant ascites. Anterior abdominal wall laxity. Small supraumbilical fat containing ventral hernia Musculoskeletal: No acute or suspicious osseous abnormality. IMPRESSION: 1. Generalized pancreatic inflammation. There is an ill-defined hypodense suspicious mass at the head of the pancreas measuring 3.4 x 2.2 x 3 cm. 2. Multiple ill-defined hypodense liver masses, consistent with metastatic disease. 3. Hypoenhancement of the splenic vein and superior mesenteric veins near their confluence, potentially occluded. 4. Multiple punctate nodules or small nodes in the right lower quadrant/gutter, cannot exclude metastatic disease 5. Moderate irregular hiatal hernia with some wall thickening, could be correlated with endoscopy 6. Bulky enlargement of the uterus with multiple large masses, increased compared to prior. Electronically Signed   By: Donavan Foil M.D.   On: 07/02/2019 22:17   US BIOPSY (LIVER)  Result Date: 07/07/2019 INDICATION: Concern for metastatic pancreatic cancer. Please perform ultrasound-guided liver lesion biopsy for tissue diagnostic purposes. EXAM: ULTRASOUND GUIDED LIVER LESION BIOPSY COMPARISON:  CT abdomen and pelvis-07/02/2019 MEDICATIONS: None ANESTHESIA/SEDATION: Fentanyl 100 mcg IV; Versed 2 mg IV Total Moderate Sedation time:  17 Minutes. The patient's level of consciousness and vital signs were  monitored continuously by radiology nursing throughout the procedure under my direct supervision. COMPLICATIONS: None immediate. PROCEDURE: Informed written consent was obtained from the patient after a discussion of the risks, benefits and alternatives to treatment. The patient understands and consents the procedure. A timeout was performed prior to the initiation of the procedure. Ultrasound scanning was performed of the right upper abdominal quadrant demonstrates an ill-defined approximately 2.9 x 3.1 x 2.2 cm hypoechoic nodule/mass within the dome of the right lobe of the liver correlating with the indeterminate mass seen on preceding abdominal CT image 15, series 3. Note, imaging was degraded secondary to patient body habitus well as well as the lesion being located within the dome of the liver with associated interposition of lung. The procedure was planned. The right upper abdominal quadrant was prepped and draped in the usual sterile fashion. The overlying soft tissues were anesthetized with 1% lidocaine with epinephrine. A 17 gauge, 6.8 cm co-axial needle was advanced into a peripheral aspect of the lesion. This was followed by 6 core biopsies with an 18 gauge core device under direct ultrasound guidance. The coaxial needle tract was embolized with a small amount of Gel-Foam slurry and superficial hemostasis was obtained with manual compression. Post procedural scanning was negative for definitive area of hemorrhage or additional complication. A dressing was placed. The patient tolerated the procedure  well without immediate post procedural complication. IMPRESSION: Technically challenging though ultimately successful ultrasound guided core needle biopsy of indeterminate mass within the dome of the right lobe of the liver. Electronically Signed   By: Sandi Mariscal M.D.   On: 07/07/2019 13:34   DG Abd 2 Views  Result Date: 07/08/2019 CLINICAL DATA:  Abdominal pain and distension x3 days. EXAM: ABDOMEN - 2  VIEW COMPARISON:  None. FINDINGS: The bowel gas pattern is normal. A moderate to marked amount of stool is seen within the ascending colon. There is no evidence of free air. No radio-opaque calculi or other significant radiographic abnormality is seen. A radiopaque tubal ligation clip is seen within the pelvis on the right. An additional tubal ligation clip is seen overlying the superior aspect of the sacrum on the left. A 4 mm focal opacity is seen overlying the symphysis pubis on the left. IMPRESSION: 1. Normal bowel gas pattern. No evidence of free air. Electronically Signed   By: Virgina Norfolk M.D.   On: 07/08/2019 16:27   DG Abd Portable 1V  Result Date: 07/10/2019 CLINICAL DATA:  Abdominal pain EXAM: PORTABLE ABDOMEN - 1 VIEW COMPARISON:  07/08/2019 FINDINGS: Bowel gas pattern remains unremarkable. Stool burden is overall mild with greater burden within the ascending colon. Tubal ligation clips noted. IMPRESSION: Normal bowel gas pattern. Electronically Signed   By: Macy Mis M.D.   On: 07/10/2019 13:20     Time Spent in minutes  30     Desiree Hane M.D on 07/11/2019 at 5:39 PM  To page go to [email or web address] - password [email or web address]

## 2019-07-12 LAB — BASIC METABOLIC PANEL
Anion gap: 15 (ref 5–15)
BUN: <5 mg/dL — ABNORMAL LOW (ref 8–23)
CO2: 24 mmol/L (ref 22–32)
Calcium: 8.9 mg/dL (ref 8.9–10.3)
Chloride: 96 mmol/L — ABNORMAL LOW (ref 98–111)
Creatinine, Ser: 0.67 mg/dL (ref 0.44–1.00)
GFR calc Af Amer: >60 mL/min (ref >60)
GFR calc non Af Amer: >60 mL/min (ref >60)
Glucose, Bld: 141 mg/dL — ABNORMAL HIGH (ref 70–99)
Potassium: 3.3 mmol/L — ABNORMAL LOW (ref 3.5–5.1)
Sodium: 135 mmol/L (ref 135–145)

## 2019-07-12 LAB — CBC
HCT: 35.3 % — ABNORMAL LOW (ref 36.0–46.0)
Hemoglobin: 11.4 g/dL — ABNORMAL LOW (ref 12.0–15.0)
MCH: 23 pg — ABNORMAL LOW (ref 26.0–34.0)
MCHC: 32.3 g/dL (ref 30.0–36.0)
MCV: 71.3 fL — ABNORMAL LOW (ref 80.0–100.0)
Platelets: 213 10*3/uL (ref 150–400)
RBC: 4.95 MIL/uL (ref 3.87–5.11)
RDW: 14.9 % (ref 11.5–15.5)
WBC: 8.9 10*3/uL (ref 4.0–10.5)
nRBC: 0 % (ref 0.0–0.2)

## 2019-07-12 LAB — MAGNESIUM: Magnesium: 1.9 mg/dL (ref 1.7–2.4)

## 2019-07-12 MED ORDER — METOCLOPRAMIDE HCL 5 MG PO TABS
5.0000 mg | ORAL_TABLET | Freq: Three times a day (TID) | ORAL | Status: DC
  Administered 2019-07-12 – 2019-07-17 (×17): 5 mg via ORAL
  Filled 2019-07-12 (×18): qty 1

## 2019-07-12 MED ORDER — POTASSIUM CHLORIDE 10 MEQ/100ML IV SOLN
10.0000 meq | INTRAVENOUS | Status: AC
  Administered 2019-07-12 (×3): 10 meq via INTRAVENOUS
  Filled 2019-07-12 (×3): qty 100

## 2019-07-12 MED ORDER — SORBITOL 70 % SOLN
30.0000 mL | Freq: Two times a day (BID) | Status: DC
  Administered 2019-07-16: 30 mL via ORAL
  Filled 2019-07-12 (×7): qty 30

## 2019-07-12 MED ORDER — PREDNISONE 20 MG PO TABS
20.0000 mg | ORAL_TABLET | Freq: Every day | ORAL | Status: DC
  Administered 2019-07-12 – 2019-07-16 (×4): 20 mg via ORAL
  Filled 2019-07-12 (×6): qty 1

## 2019-07-12 NOTE — Progress Notes (Signed)
IP PROGRESS NOTE  Subjective:   She reports partial improvement in abdominal pain since starting OxyContin.  She continues to have intermittent nausea.  She reports anorexia.  No bowel movement since hospital admission.  Objective: Vital signs in last 24 hours: Blood pressure (!) 149/86, pulse 95, temperature 98.5 F (36.9 C), temperature source Oral, resp. rate 15, height 5\' 7"  (1.702 m), weight 243 lb 9.7 oz (110.5 kg), SpO2 91 %.  Intake/Output from previous day: 01/08 0701 - 01/09 0700 In: 60 [P.O.:60] Out: -   Physical Exam:   Abdomen: Soft and nontender, no mass   Lab Results: Recent Labs    07/10/19 0813 07/11/19 0550  WBC 6.1 7.3  HGB 11.8* 11.6*  HCT 36.6 36.3  PLT 201 231    BMET Recent Labs    07/10/19 0813 07/11/19 0550  NA 134* 133*  K 3.1* 3.0*  CL 95* 94*  CO2 24 25  GLUCOSE 135* 129*  BUN <5* <5*  CREATININE 0.62 0.63  CALCIUM 8.8* 8.8*    No results found for: CEA1  Studies/Results: DG Abd Portable 1V  Result Date: 07/10/2019 CLINICAL DATA:  Abdominal pain EXAM: PORTABLE ABDOMEN - 1 VIEW COMPARISON:  07/08/2019 FINDINGS: Bowel gas pattern remains unremarkable. Stool burden is overall mild with greater burden within the ascending colon. Tubal ligation clips noted. IMPRESSION: Normal bowel gas pattern. Electronically Signed   By: Macy Mis M.D.   On: 07/10/2019 13:20    Medications: I have reviewed the patient's current medications.  Assessment/Plan: 1.Metastatic pancreatic adenocarcinoma             -CT of the abdomen pelvis on 07/02/2019 showed a 3.4 x 2.2 x 3              cm mass in the head of the pancreas with multiple ill-defined              hypodense liver masses.             -CT of the chest on 07/07/2019 showed no evidence of thoracic              metastasis.             -Ultrasound guided liver biopsy on 07/07/2019 demonstrated              metastatic pancreatic adenocarcinoma. 2.  Abdominal pain, nausea, vomiting secondary  to pancreatic adenocarcinoma 3.  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  Chronicity, now with mild microcytic anemia 4.  Hypokalemia 5.  Uterine fibroids 6.  Abdominal pain secondary to #1 7.  Nausea  Ms. Riga has persistent nausea and anorexia.  She has constipation.  I suspect the nausea is related to the pancreas tumor.  She will not be a candidate for palliative systemic chemotherapy unless she is able to eat and ambulate.  She is scheduled for Port-A-Cath placement on 07/13/2018.  I will add low-dose prednisone as an appetite stimulant.  This may also provide some relief from nausea.  I will add sorbitol to the bowel regimen.  Please call Oncology as needed on 07/13/2019.  I will check on her 07/14/2019.  Outpatient follow-up will be scheduled at the Cancer center with the plan to begin chemotherapy if her performance status improves.   LOS: 9 days   Betsy Coder, MD   07/12/2019, 8:58 AM

## 2019-07-12 NOTE — Progress Notes (Signed)
PROGRESS NOTE    Rachel Vasquez  [id number] DOB: 1955-08-16 DOA: 07/02/2019 PCP: Patient, [name] Per   Brief Narrative:  64 year old woman with history of hypertension presenting with nausea and vomiting as well as weight loss found to have metastatic pancreatic cancer. Oncology has been consulted and recommended starting chemotherapy, with planned port placement on 111. Hospital course has been complicated by persistent nausea and vomiting and poor appetite.   Assessment & Plan:   Principal Problem:   Mass of pancreas Active Problems:   Nausea & vomiting   GERD (gastroesophageal reflux disease)   Hiatal hernia   Liver lesion   Abdominal pain, epigastric   Pancreatic mass   Pancreatitis   Microcytic anemia   Pancreatic adenocarcinoma (HCC)   Hypokalemia   Hypomagnesemia  Mass of pancreas, with metastatic pancreatic cancer, likely because of persistent nausea and vomiting -Oncology consulted appreciate recommendations. -Plan for port placement on Monday. -Given persistent nausea discussed possible discussion with palliative care to aid in management of symptoms. Patient will think about this. She is aware of her diagnosis is not curable.  -Pain currently controlled. She reports improvement since adding long-acting OxyContin. She is on OxyContin 10 mg every 12 hours per oncology. She has oxycodone 5 mg for breakthrough pain. Morphine injections available for severe pain.  Persistent nausea and vomiting, likely secondary to pancreatic mass and ongoing mild pancreatitis associated with this. Electrolyte abnormalities may also be contributing. -Minimally improved this morning, prednisone added by oncology today. -Patient has persistent belching. Add on Reglan prior to meals. -Continue as needed Zofran for breakthrough nausea. -Nutrition consulted. Patient very much wishes to avoid tube feedings if at all possible. -Bowel regimen increased today.  Hypokalemia,  repleted.  Microcytic anemia, likely secondary to underlying carcinoma. This point we will continue to monitor.  DVT prophylaxis: Lovenox Code Status: Full Family Communication: Discussed with patient, son also available via phone he is currently in [place] playing basketball. Disposition Plan: Home pending ability to tolerate oral intake.   Consultants:   Oncology   Procedures:   IR biopsy on 1/4    Antimicrobials:   None     Subjective: Patient reports pain is improved today considerably. She endorses ongoing nausea and belching. She reports she still has no appetite. She denies chest pain, back pain, emesis. She has not had a bowel movement.  Objective: Vitals:   07/11/19 0609 07/11/19 1106 07/11/19 2347 07/12/19 0519  BP: (!) 171/98 (!) 145/80 (!) 147/74 (!) 149/86  Pulse: 90  88 95  Resp: 17  18 15   Temp: (!) 97.4 F (36.3 C) 98 F (36.7 C) 98.2 F (36.8 C) 98.5 F (36.9 C)  TempSrc: Oral Oral Oral Oral  SpO2: 93% 95% 98% 91%  Weight:    110.5 kg  Height:        Intake/Output Summary (Last 24 hours) at 07/12/2019 1144 Last data filed at 07/12/2019 0800 Gross per 24 hour  Intake 180 ml  Output --  Net 180 ml   Filed Weights   07/08/19 0539 07/09/19 0610 07/12/19 0519  Weight: 110.4 kg 111 kg 110.5 kg    Examination:  General exam: Appears calm and comfortable  Respiratory system: Clear to auscultation. Respiratory effort normal. Cardiovascular system: S1 & S2 heard, RRR. No JVD, murmurs, rubs, gallops or clicks. No pedal edema. Gastrointestinal system: Abdomen is nondistended, TTP throughout, most notably in epigastric area.  Central nervous system: Alert and oriented. No focal neurological deficits. Extremities: Symmetric, no edema, full  ROM  Psychiatry: Judgement and insight appear normal. Mood & affect appropriate.     Data Reviewed: I have personally reviewed following labs and imaging studies  CBC: Recent Labs  Lab 07/06/19 0119  07/08/19 0548 07/10/19 0813 07/11/19 0550 07/12/19 0825  WBC 5.6 6.2 6.1 7.3 8.9  HGB 10.8* 11.6* 11.8* 11.6* 11.4*  HCT 34.8* 37.6 36.6 36.3 35.3*  MCV 75.0* 74.0* 72.0* 71.7* 71.3*  PLT 199 250 201 231 [id number]   Basic Metabolic Panel: Recent Labs  Lab 07/06/19 0119 07/08/19 0548 07/10/19 0813 07/11/19 0550 07/12/19 0825  NA 135 138 134* 133* 135  K 3.5 3.2* 3.1* 3.0* 3.3*  CL 100 99 95* 94* 96*  CO2 22 23 24 25 24   GLUCOSE 112* 118* 135* 129* 141*  BUN <5* <5* <5* <5* <5*  CREATININE 0.69 0.74 0.62 0.63 0.67  CALCIUM 8.1* 9.1 8.8* 8.8* 8.9  MG  --   --  1.6* 1.9 1.9  PHOS  --   --   --  3.0  --    GFR: Estimated Creatinine Clearance: 91.1 mL/min (by C-G formula based on SCr of 0.67 mg/dL). Liver Function Tests: Recent Labs  Lab 07/06/19 0119  AST 29  ALT 41  ALKPHOS 92  BILITOT 1.1  PROT 6.1*  ALBUMIN 2.6*  Coagulation Profile: Recent Labs  Lab 07/07/19 0050  INR 1.2   Anemia Panel: Recent Labs    07/10/19 0813  FERRITIN 341*  TIBC 203*  IRON 72   Sepsis Labs: No results for input(s): PROCALCITON, LATICACIDVEN in the last 168 hours.  Recent Results (from the past 240 hour(s))  SARS CORONAVIRUS 2 (TAT 6-24 HRS) Nasopharyngeal Nasopharyngeal Swab     Status: None   Collection Time: 07/02/19 11:40 PM   Specimen: Nasopharyngeal Swab  Result Value Ref Range Status   SARS Coronavirus 2 NEGATIVE NEGATIVE Final    Comment: (NOTE) SARS-CoV-2 target nucleic acids are NOT DETECTED. The SARS-CoV-2 RNA is generally detectable in upper and lower respiratory specimens during the acute phase of infection. Negative results do not preclude SARS-CoV-2 infection, do not rule out co-infections with other pathogens, and should not be used as the sole basis for treatment or other patient management decisions. Negative results must be combined with clinical observations, patient history, and epidemiological information. The expected result is Negative. Fact Sheet for  Patients: SugarRoll.be Fact Sheet for Healthcare Providers: https://www.woods-mathews.com/ This test is not yet approved or cleared by the [place] FDA and  has been authorized for detection and/or diagnosis of SARS-CoV-2 by FDA under an Emergency Use Authorization (EUA). This EUA will remain  in effect (meaning this test can be used) for the duration of the COVID-19 declaration under Section 56 4(b)(1) of the Act, 21 U.S.C. section 360bbb-3(b)(1), unless the authorization is terminated or revoked sooner. Performed at [facility], [place] [address], [place], [place] [address]          Radiology Studies: DG Abd Portable 1V  Result Date: 07/10/2019 CLINICAL DATA:  Abdominal pain EXAM: PORTABLE ABDOMEN - 1 VIEW COMPARISON:  07/08/2019 FINDINGS: Bowel gas pattern remains unremarkable. Stool burden is overall mild with greater burden within the ascending colon. Tubal ligation clips noted. IMPRESSION: Normal bowel gas pattern. Electronically Signed   By: Macy Mis M.D.   On: 07/10/2019 13:20    Scheduled Meds: . amLODipine  10 mg Oral Daily  . enoxaparin (LOVENOX) injection  40 mg Subcutaneous Q24H  . feeding supplement (PRO-STAT SUGAR FREE 64)  30 mL Oral TID  . metoCLOPramide  5 mg Oral TID AC & HS  . multivitamin with minerals  1 tablet Oral Daily  . oxyCODONE  10 mg Oral Q12H  . pantoprazole (PROTONIX) IV  40 mg Intravenous Q12H  . predniSONE  20 mg Oral Q breakfast  . sorbitol  30 mL Oral BID   Continuous Infusions: . sodium chloride 50 mL/hr at 07/10/19 2207  . potassium chloride       LOS: 9 days    Time spent: >25 minutes     Dorris Singh, MD   [facility] Pager [phone]  If 7PM-7AM, please contact night-coverage [email or web address] Password [facility] 07/12/2019, 11:44 AM

## 2019-07-12 NOTE — Progress Notes (Signed)
Patient asks to have her AM medication put on hold. She is attempting to have BM at this time. Patient requests to have suppository first. Will continue to monitor.  Ave Filter, RN

## 2019-07-12 NOTE — Progress Notes (Signed)
Nutrition Follow-up  DOCUMENTATION CODES:   Obesity unspecified  INTERVENTION:  Consider nutrition support via nocturnal feedings vs continuous  If patient should be agreeable to initiating  short term tube feeding via NGT recommend:  Initiate Vital 1.2 @ 25 ml/hr and increase by 10 ml every 8 hours to goal rate of 65 ml/hr.   Tube feeding regimen provides 1872 kcal (100% of needs), 117 grams of protein, and 1265 ml of H2O.   Patient is high risk for refeeding, monitor magnesium, potassium, and phosphorus daily for at least 3 days with initiation of nutrition support  NUTRITION DIAGNOSIS:   Inadequate oral intake related to nausea, vomiting as evidenced by per patient/family report.  Ongoing  GOAL:   Patient will meet greater than or equal to 90% of their needs  Unmet secondary to persistent nausea and vomiting, pt unable to tolerate oral intake of meals and supplements. Recommendations for initiation of nutrition support if patient agreeable.   MONITOR:   PO intake, Supplement acceptance  REASON FOR ASSESSMENT:   Consult Assessment of nutrition requirement/status  ASSESSMENT:  RD working remotely.  64 yo female admitted with progressive nausea, vomiting, loss of appetite, fatigue, epigastric pain. CT scan showed mass at the head of the pancreas and hypodense liver lesions. No known PMH.  1/4 liver lesion biopsy demonstrated metastatic pancreatic adenocarcinoma.   Per notes, Port-A-Cath placement planned for 1/11; outpatient follow-up to be scheduled at [facility] with plans to begin chemotherapy pending improvements to performance status.   Patient remains with ongoing minimal intake, noted 0-5% of breakfast and lunch meals today and 0-25% of the 6 previously recorded meals. Per 1/06 RD note, pt with extremely minimal intake since admission, only consuming sips of vegetable broth and bites of fruit. Patient refusing Boost Breeze supplements as of 1/6 secondary to  dislike and nausea.   Per chart review, patient would like to avoid tube feedings. At this point patient has been without adequate nutrition for 10 days and highly recommend patient consideration of nutrition support. Nocturnal feedings could also be considered; allowing for continued encouragement of po and supplementing pt until adequacy of oral intake is achieved.  Per notes, Prednisone added for appetite stimulant by oncology today; Bowel regimen has been increased, noted pt had small type 5 bowel movement today; as well as Reglan prior to meals in efforts to improve patient's intake/toleration of oral diet.   Covering RD to follow up on Monday to evaluate improving oral intake given above efforts initiated today.   Current wt 110.5 kg Admit wt 112.5 kg  Medications and labs reviewed    Diet Order:   Diet Order            Diet NPO time specified Except for: Sips with Meds  Diet effective midnight        DIET SOFT Room service appropriate? Yes; Fluid consistency: Thin  Diet effective now              EDUCATION NEEDS:   Education needs have been addressed  Skin:  Skin Assessment: Skin Integrity Issues: Skin Integrity Issues:: Incisions Incisions: rt upper abdomen  Last BM:  1/09 (type 5; small; brown)  Height:   Ht Readings from Last 1 Encounters:  07/02/19 5\' 7"  (1.702 m)    Weight:   Wt Readings from Last 1 Encounters:  07/12/19 110.5 kg    Ideal Body Weight:  61.4 kg  BMI:  Body mass index is 38.15 kg/m.  Estimated Nutritional Needs:  Kcal:  1800-2100  Protein:  115-130 gm  Fluid:  >/= 1.8 L   Lajuan Lines, RD, LDN Clinical Nutrition [facility] Telephone [phone] After Hours/Weekend Pager: [phone]

## 2019-07-12 NOTE — Progress Notes (Signed)
Patient express relief with Reglan oral today and able to eat approximately 25 percent dinner.   Ave Filter, RN

## 2019-07-13 LAB — BASIC METABOLIC PANEL
Anion gap: 12 (ref 5–15)
BUN: 6 mg/dL — ABNORMAL LOW (ref 8–23)
CO2: 24 mmol/L (ref 22–32)
Calcium: 8.9 mg/dL (ref 8.9–10.3)
Chloride: 99 mmol/L (ref 98–111)
Creatinine, Ser: 0.59 mg/dL (ref 0.44–1.00)
GFR calc Af Amer: >60 mL/min (ref >60)
GFR calc non Af Amer: >60 mL/min (ref >60)
Glucose, Bld: 136 mg/dL — ABNORMAL HIGH (ref 70–99)
Potassium: 3.3 mmol/L — ABNORMAL LOW (ref 3.5–5.1)
Sodium: 135 mmol/L (ref 135–145)

## 2019-07-13 LAB — CBC
HCT: 34.3 % — ABNORMAL LOW (ref 36.0–46.0)
Hemoglobin: 11.3 g/dL — ABNORMAL LOW (ref 12.0–15.0)
MCH: 23.3 pg — ABNORMAL LOW (ref 26.0–34.0)
MCHC: 32.9 g/dL (ref 30.0–36.0)
MCV: 70.7 fL — ABNORMAL LOW (ref 80.0–100.0)
Platelets: 213 10*3/uL (ref 150–400)
RBC: 4.85 MIL/uL (ref 3.87–5.11)
RDW: 14.8 % (ref 11.5–15.5)
WBC: 9.3 10*3/uL (ref 4.0–10.5)
nRBC: 0 % (ref 0.0–0.2)

## 2019-07-13 LAB — MAGNESIUM: Magnesium: 1.8 mg/dL (ref 1.7–2.4)

## 2019-07-13 MED ORDER — CEFAZOLIN SODIUM-DEXTROSE 2-4 GM/100ML-% IV SOLN: 2.0000 g | INTRAVENOUS | Status: AC

## 2019-07-13 MED ORDER — ENOXAPARIN SODIUM 40 MG/0.4ML ~~LOC~~ SOLN
40.0000 mg | SUBCUTANEOUS | Status: DC
  Administered 2019-07-13: 40 mg via SUBCUTANEOUS
  Filled 2019-07-13: qty 0.4

## 2019-07-13 MED ORDER — ENOXAPARIN SODIUM 40 MG/0.4ML ~~LOC~~ SOLN: 40.0000 mg | SUBCUTANEOUS | Status: DC

## 2019-07-13 MED ORDER — ENOXAPARIN SODIUM 40 MG/0.4ML ~~LOC~~ SOLN
40.0000 mg | SUBCUTANEOUS | Status: DC
  Administered 2019-07-15 – 2019-07-17 (×3): 40 mg via SUBCUTANEOUS
  Filled 2019-07-13 (×3): qty 0.4

## 2019-07-13 NOTE — Progress Notes (Signed)
IP PROGRESS NOTE  Subjective:   Her pain is overall improved, but she continues to have back pain.  She has intermittent nausea.  She had a bowel movement yesterday.  Objective: Vital signs in last 24 hours: Blood pressure (!) 147/84, pulse 81, temperature 97.6 F (36.4 C), temperature source Oral, resp. rate 18, height 5\' 7"  (1.702 m), weight 242 lb 3.2 oz (109.9 kg), SpO2 97 %.  Intake/Output from previous day: 01/09 0701 - 01/10 0700 In: 3228.4 [P.O.:480; I.V.:2448.4; IV Piggyback:300] Out: 400 [Urine:400]  Physical Exam:  Musculoskeletal: Pain at the bilateral lower back, no tenderness Abdomen: Soft and nontender, no mass   Lab Results: Recent Labs    07/12/19 0825 07/13/19 0342  WBC 8.9 9.3  HGB 11.4* 11.3*  HCT 35.3* 34.3*  PLT 213 213    BMET Recent Labs    07/12/19 0825 07/13/19 0342  NA 135 135  K 3.3* 3.3*  CL 96* 99  CO2 24 24  GLUCOSE 141* 136*  BUN <5* 6*  CREATININE 0.67 0.59  CALCIUM 8.9 8.9    No results found for: CEA1  Studies/Results: No results found.  Medications: I have reviewed the patient's current medications.  Assessment/Plan: 1.Metastatic pancreatic adenocarcinoma             -CT of the abdomen pelvis on 07/02/2019 showed a 3.4 x 2.2 x 3              cm mass in the head of the pancreas with multiple ill-defined              hypodense liver masses.             -CT of the chest on 07/07/2019 showed no evidence of thoracic              metastasis.             -Ultrasound guided liver biopsy on 07/07/2019 demonstrated              metastatic pancreatic adenocarcinoma. 2.  Abdominal pain, nausea, vomiting secondary to pancreatic adenocarcinoma 3.  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  Chronicity, now with mild microcytic anemia 4.  Hypokalemia 5.  Uterine fibroids 6.  Abdominal pain secondary to #1 7.  Nausea  Ms. Canterbury appears unchanged.  The pain has improved with OxyContin.  She has persistent nausea.  She  had a bowel movement yesterday.  She is scheduled for Port-A-Cath placement on 07/13/2018.  Recommendations: 1.  Continue the current pain regimen 2.  Continue metoclopramide for nausea 3.  Continue prednisone as an appetite stimulant 4.  Physical therapy evaluation, increase ambulation as tolerated   Outpatient follow-up will be scheduled at the Cancer center with the plan to begin chemotherapy if her performance status improves.   LOS: 10 days   Betsy Coder, MD   07/13/2019, 8:42 AM

## 2019-07-13 NOTE — Progress Notes (Signed)
PROGRESS NOTE    Rachel Vasquez  [id number] DOB: 01-06-1956 DOA: 07/02/2019 PCP: Patient, [name] Per   Brief Narrative:  64 year old woman with history of hypertension presenting with nausea and vomiting as well as weight loss found to have metastatic pancreatic cancer. Oncology has been consulted and recommended starting chemotherapy, with planned port placement on 111. Hospital course complicated by persistent nausea and vomiting and poor appetite.   Assessment & Plan:   Principal Problem:   Mass of pancreas Active Problems:   Nausea & vomiting   GERD (gastroesophageal reflux disease)   Hiatal hernia   Liver lesion   Abdominal pain, epigastric   Pancreatic mass   Pancreatitis   Microcytic anemia   Pancreatic adenocarcinoma (HCC)   Hypokalemia   Hypomagnesemia  metastatic pancreatic  Adenocarcinoma Associated with  persistent nausea and vomiting and abdominal pain -Oncology consulted appreciate recommendations. -Plan for port placement on Monday. -Given persistent nausea discussed possible discussion with palliative care to aid in management of symptoms. Patient will think about this. She is aware of her diagnosis is not curable.  - supportive care for antinauseants and analgesics  Persistent nausea and vomiting, likely secondary to pancreatic mass and ongoing mild pancreatitis associated with this.  - supportive care with antinauseants, monitor fluid status with electrolytes and renal function. -Nutrition consulted. Patient very much wishes to avoid tube feedings if at all possible. -Bowel regimen increased today.  Hypokalemia, repleted.  Microcytic anemia, likely secondary to underlying carcinoma. This point we will continue to monitor.  DVT prophylaxis: Lovenox Code Status: Full Family Communication: Discussed with patient Disposition Plan: Home pending ability to tolerate oral intake.   Consultants:   Oncology   Procedures:   IR biopsy on 1/4     Antimicrobials:   None     Subjective:  nausea and vomiting improving.  Some back pain today  Objective: Vitals:   07/13/19 0058 07/13/19 0557 07/13/19 0707 07/13/19 1032  BP: (!) 151/85 (!) 146/84 (!) 147/84 (!) 144/89  Pulse: 90 84 81 90  Resp: 18 18 18 18   Temp: 97.7 F (36.5 C) 98 F (36.7 C) 97.6 F (36.4 C) 98.1 F (36.7 C)  TempSrc: Oral Oral Oral Oral  SpO2: 98% 98% 97% 98%  Weight:  109.9 kg    Height:        Intake/Output Summary (Last 24 hours) at 07/13/2019 1118 Last data filed at 07/13/2019 0840 Gross per 24 hour  Intake 3461.67 ml  Output 400 ml  Net 3061.67 ml   Filed Weights   07/09/19 0610 07/12/19 0519 07/13/19 0557  Weight: 111 kg 110.5 kg 109.9 kg    Examination:  General exam:  Comfortable, no acute distress Respiratory system: Clear to auscultation. Respiratory effort normal. Cardiovascular system: S1 & S2 heard, RRR. No JVD, murmurs, rubs, gallops or clicks. No pedal edema. Gastrointestinal system: Abdomen is nondistended, TTP throughout, most notably in epigastric area.  Central nervous system: Alert and oriented. No focal neurological deficits. Extremities: Symmetric, no edema, full ROM  Psychiatry: Judgement and insight appear normal. Mood & affect appropriate.     Data Reviewed: I have personally reviewed following labs and imaging studies  CBC: Recent Labs  Lab 07/08/19 0548 07/10/19 0813 07/11/19 0550 07/12/19 0825 07/13/19 0342  WBC 6.2 6.1 7.3 8.9 9.3  HGB 11.6* 11.8* 11.6* 11.4* 11.3*  HCT 37.6 36.6 36.3 35.3* 34.3*  MCV 74.0* 72.0* 71.7* 71.3* 70.7*  PLT 250 201 231 213 123456   Basic Metabolic Panel: Recent  Labs  Lab 07/08/19 0548 07/10/19 0813 07/11/19 0550 07/12/19 0825 07/13/19 0342  NA 138 134* 133* 135 135  K 3.2* 3.1* 3.0* 3.3* 3.3*  CL 99 95* 94* 96* 99  CO2 23 24 25 24 24   GLUCOSE 118* 135* 129* 141* 136*  BUN <5* <5* <5* <5* 6*  CREATININE 0.74 0.62 0.63 0.67 0.59  CALCIUM 9.1 8.8* 8.8* 8.9 8.9   MG  --  1.6* 1.9 1.9 1.8  PHOS  --   --  3.0  --   --    GFR: Estimated Creatinine Clearance: 90.7 mL/min (by C-G formula based on SCr of 0.59 mg/dL). Liver Function Tests: No results for input(s): AST, ALT, ALKPHOS, BILITOT, PROT, ALBUMIN in the last 168 hours.Coagulation Profile: Recent Labs  Lab 07/07/19 0050  INR 1.2   Anemia Panel: No results for input(s): VITAMINB12, FOLATE, FERRITIN, TIBC, IRON, RETICCTPCT in the last 72 hours. Sepsis Labs: No results for input(s): PROCALCITON, LATICACIDVEN in the last 168 hours.  No results found for this or any previous visit (from the past 240 hour(s)).       Radiology Studies: No results found.  Scheduled Meds: . amLODipine  10 mg Oral Daily  . [START ON 07/15/2019] enoxaparin (LOVENOX) injection  40 mg Subcutaneous Q24H  . feeding supplement (PRO-STAT SUGAR FREE 64)  30 mL Oral TID  . metoCLOPramide  5 mg Oral TID AC & HS  . multivitamin with minerals  1 tablet Oral Daily  . oxyCODONE  10 mg Oral Q12H  . pantoprazole (PROTONIX) IV  40 mg Intravenous Q12H  . predniSONE  20 mg Oral Q breakfast  . sorbitol  30 mL Oral BID   Continuous Infusions: . sodium chloride 50 mL/hr at 07/13/19 0628  . [START ON 07/14/2019]  ceFAZolin (ANCEF) IV       LOS: 10 days    Time spent: 25 minutes      Benito Mccreedy, MD   [facility] Pager [phone]  If 7PM-7AM, please contact night-coverage [email or web address] Password [email or web address] 07/13/2019, 11:18 AM

## 2019-07-14 ENCOUNTER — Inpatient Hospital Stay (HOSPITAL_COMMUNITY): Payer: BC Managed Care – PPO

## 2019-07-14 HISTORY — PX: IR IMAGING GUIDED PORT INSERTION: IMG5740

## 2019-07-14 LAB — BASIC METABOLIC PANEL
Anion gap: 12 (ref 5–15)
Anion gap: 14 (ref 5–15)
BUN: 7 mg/dL — ABNORMAL LOW (ref 8–23)
BUN: 7 mg/dL — ABNORMAL LOW (ref 8–23)
CO2: 29 mmol/L (ref 22–32)
CO2: 26 mmol/L (ref 22–32)
Calcium: 9.1 mg/dL (ref 8.9–10.3)
Calcium: 9.1 mg/dL (ref 8.9–10.3)
Chloride: 95 mmol/L — ABNORMAL LOW (ref 98–111)
Chloride: 94 mmol/L — ABNORMAL LOW (ref 98–111)
Creatinine, Ser: 0.5 mg/dL (ref 0.44–1.00)
Creatinine, Ser: 0.6 mg/dL (ref 0.44–1.00)
GFR calc Af Amer: >60 mL/min (ref >60)
GFR calc Af Amer: >60 mL/min (ref >60)
GFR calc non Af Amer: >60 mL/min (ref >60)
GFR calc non Af Amer: >60 mL/min (ref >60)
Glucose, Bld: 168 mg/dL — ABNORMAL HIGH (ref 70–99)
Glucose, Bld: 194 mg/dL — ABNORMAL HIGH (ref 70–99)
Potassium: 3.1 mmol/L — ABNORMAL LOW (ref 3.5–5.1)
Potassium: 2.9 mmol/L — ABNORMAL LOW (ref 3.5–5.1)
Sodium: 136 mmol/L (ref 135–145)
Sodium: 134 mmol/L — ABNORMAL LOW (ref 135–145)

## 2019-07-14 LAB — CBC WITH DIFFERENTIAL/PLATELET
Abs Immature Granulocytes: 0.13 10*3/uL — ABNORMAL HIGH (ref 0.00–0.07)
Basophils Absolute: 0 10*3/uL (ref 0.0–0.1)
Basophils Relative: 0 %
Eosinophils Absolute: 0 10*3/uL (ref 0.0–0.5)
Eosinophils Relative: 0 %
HCT: 35.3 % — ABNORMAL LOW (ref 36.0–46.0)
Hemoglobin: 11.7 g/dL — ABNORMAL LOW (ref 12.0–15.0)
Immature Granulocytes: 1 %
Lymphocytes Relative: 10 %
Lymphs Abs: 1 10*3/uL (ref 0.7–4.0)
MCH: 23 pg — ABNORMAL LOW (ref 26.0–34.0)
MCHC: 33.1 g/dL (ref 30.0–36.0)
MCV: 69.4 fL — ABNORMAL LOW (ref 80.0–100.0)
Monocytes Absolute: 1.1 10*3/uL — ABNORMAL HIGH (ref 0.1–1.0)
Monocytes Relative: 11 %
Neutro Abs: 7.8 10*3/uL — ABNORMAL HIGH (ref 1.7–7.7)
Neutrophils Relative %: 78 %
Platelets: 183 10*3/uL (ref 150–400)
RBC: 5.09 MIL/uL (ref 3.87–5.11)
RDW: 15.1 % (ref 11.5–15.5)
WBC: 10.1 10*3/uL (ref 4.0–10.5)
nRBC: 0.3 % — ABNORMAL HIGH (ref 0.0–0.2)

## 2019-07-14 LAB — PROTIME-INR
INR: 1.3 — ABNORMAL HIGH (ref 0.8–1.2)
Prothrombin Time: 16 seconds — ABNORMAL HIGH (ref 11.4–15.2)

## 2019-07-14 LAB — MAGNESIUM: Magnesium: 1.9 mg/dL (ref 1.7–2.4)

## 2019-07-14 LAB — SURGICAL PCR SCREEN
MRSA, PCR: NEGATIVE
Staphylococcus aureus: NEGATIVE

## 2019-07-14 MED ORDER — POTASSIUM CHLORIDE 10 MEQ/100ML IV SOLN
10.0000 meq | INTRAVENOUS | Status: AC
  Administered 2019-07-14 (×4): 10 meq via INTRAVENOUS
  Filled 2019-07-14 (×3): qty 100

## 2019-07-14 MED ORDER — DIPHENHYDRAMINE HCL 50 MG/ML IJ SOLN: 12.5000 mg | Freq: Once | INTRAMUSCULAR | Status: DC

## 2019-07-14 MED ORDER — CEFAZOLIN SODIUM-DEXTROSE 2-4 GM/100ML-% IV SOLN
INTRAVENOUS | Status: AC
  Administered 2019-07-14: 2 g via INTRAVENOUS
  Filled 2019-07-14: qty 100

## 2019-07-14 MED ORDER — PANTOPRAZOLE SODIUM 40 MG PO TBEC
40.0000 mg | DELAYED_RELEASE_TABLET | Freq: Two times a day (BID) | ORAL | Status: DC
  Administered 2019-07-14 – 2019-07-17 (×5): 40 mg via ORAL
  Filled 2019-07-14 (×7): qty 1

## 2019-07-14 MED ORDER — MIDAZOLAM HCL 2 MG/2ML IJ SOLN
INTRAMUSCULAR | Status: AC | PRN
  Administered 2019-07-14 (×2): 0.5 mg via INTRAVENOUS
  Administered 2019-07-14: 1 mg via INTRAVENOUS

## 2019-07-14 MED ORDER — HEPARIN SOD (PORK) LOCK FLUSH 100 UNIT/ML IV SOLN
INTRAVENOUS | Status: AC
  Filled 2019-07-14: qty 5

## 2019-07-14 MED ORDER — OXYCODONE HCL ER 10 MG PO T12A
20.0000 mg | EXTENDED_RELEASE_TABLET | Freq: Every morning | ORAL | Status: DC
  Administered 2019-07-15 – 2019-07-16 (×2): 20 mg via ORAL
  Filled 2019-07-14 (×3): qty 2

## 2019-07-14 MED ORDER — PHENOL 1.4 % MT LIQD
1.0000 | OROMUCOSAL | Status: DC | PRN
  Administered 2019-07-14: 1 via OROMUCOSAL
  Filled 2019-07-14: qty 177

## 2019-07-14 MED ORDER — OXYCODONE HCL ER 10 MG PO T12A
10.0000 mg | EXTENDED_RELEASE_TABLET | Freq: Every day | ORAL | Status: DC
  Administered 2019-07-14 – 2019-07-16 (×3): 10 mg via ORAL
  Filled 2019-07-14 (×3): qty 1

## 2019-07-14 MED ORDER — LIDOCAINE-EPINEPHRINE 1 %-1:100000 IJ SOLN
20.0000 mL | Freq: Once | INTRAMUSCULAR | Status: DC
  Filled 2019-07-14: qty 20

## 2019-07-14 MED ORDER — HYDRALAZINE HCL 20 MG/ML IJ SOLN: 10.0000 mg | Freq: Four times a day (QID) | INTRAMUSCULAR | Status: DC | PRN

## 2019-07-14 MED ORDER — POTASSIUM CHLORIDE IN NACL 40-0.9 MEQ/L-% IV SOLN
INTRAVENOUS | Status: DC
  Administered 2019-07-14: 50 mL/h via INTRAVENOUS
  Filled 2019-07-14 (×2): qty 1000

## 2019-07-14 MED ORDER — CHLORHEXIDINE GLUCONATE CLOTH 2 % EX PADS
6.0000 | MEDICATED_PAD | Freq: Every day | CUTANEOUS | Status: DC
  Administered 2019-07-14 – 2019-07-17 (×4): 6 via TOPICAL

## 2019-07-14 MED ORDER — FENTANYL CITRATE (PF) 100 MCG/2ML IJ SOLN
INTRAMUSCULAR | Status: AC | PRN
  Administered 2019-07-14: 50 ug via INTRAVENOUS
  Administered 2019-07-14: 25 ug via INTRAVENOUS

## 2019-07-14 MED ORDER — FENTANYL CITRATE (PF) 100 MCG/2ML IJ SOLN
INTRAMUSCULAR | Status: AC
  Filled 2019-07-14: qty 2

## 2019-07-14 MED ORDER — POTASSIUM CHLORIDE CRYS ER 20 MEQ PO TBCR
40.0000 meq | EXTENDED_RELEASE_TABLET | Freq: Two times a day (BID) | ORAL | Status: DC
  Administered 2019-07-16: 40 meq via ORAL
  Filled 2019-07-14 (×4): qty 2

## 2019-07-14 MED ORDER — MIDAZOLAM HCL 2 MG/2ML IJ SOLN
INTRAMUSCULAR | Status: AC
  Filled 2019-07-14: qty 2

## 2019-07-14 NOTE — Progress Notes (Addendum)
IP PROGRESS NOTE  Subjective:   Port-A-Cath placed earlier today and she tolerated the procedure well.  Still having intermittent abdominal and back pain, but improving.  Her nausea and vomiting are also improving.  She is able to eat yesterday and keep it down.  She has not eaten yet today secondary to her procedure.  Bowels moved yesterday.  Objective: Vital signs in last 24 hours: Blood pressure 135/81, pulse 97, temperature 98.1 F (36.7 C), temperature source Oral, resp. rate 18, height 5\' 7"  (1.702 m), weight 242 lb 3.2 oz (109.9 kg), SpO2 94 %.  Intake/Output from previous day: 01/10 0701 - 01/11 0700 In: 1730 [P.O.:480; I.V.:1250] Out: -   Physical Exam:  Musculoskeletal: Pain at the bilateral lower back, no tenderness Abdomen: Soft and nontender, no mass   Lab Results: Recent Labs    07/13/19 0342 07/14/19 0621  WBC 9.3 10.1  HGB 11.3* 11.7*  HCT 34.3* 35.3*  PLT 213 183    BMET Recent Labs    07/14/19 0621 07/14/19 1239  NA 136 134*  K 3.1* 2.9*  CL 95* 94*  CO2 29 26  GLUCOSE 168* 194*  BUN 7* 7*  CREATININE 0.50 0.60  CALCIUM 9.1 9.1    No results found for: CEA1  Studies/Results: IR IMAGING GUIDED PORT INSERTION  Result Date: 07/14/2019 CLINICAL DATA:  Pancreatic carcinoma, needs poor for planned chemotherapy regimen EXAM: TUNNELED PORT CATHETER PLACEMENT WITH ULTRASOUND AND FLUOROSCOPIC GUIDANCE FLUOROSCOPY TIME:  0.6 minute; 52  uGym2 DAP ANESTHESIA/SEDATION: Intravenous Fentanyl 94mcg and Versed 2mg  were administered as conscious sedation during continuous monitoring of the patient's level of consciousness and physiological / cardiorespiratory status by the radiology RN, with a total moderate sedation time of 21 minutes. TECHNIQUE: The procedure, risks, benefits, and alternatives were explained to the patient. Questions regarding the procedure were encouraged and answered. The patient understands and consents to the procedure. As antibiotic  prophylaxis, cefazolin 2 g was ordered pre-procedure and administered intravenously within one hour of incision. Patency of the right IJ vein was confirmed with ultrasound with image documentation. An appropriate skin site was determined. Skin site was marked. Region was prepped using maximum barrier technique including cap and mask, sterile gown, sterile gloves, large sterile sheet, and Chlorhexidine as cutaneous antisepsis. The region was infiltrated locally with 1% lidocaine. Under real-time ultrasound guidance, the right IJ vein was accessed with a 21 gauge micropuncture needle; the needle tip within the vein was confirmed with ultrasound image documentation. Needle was exchanged over a 018 guidewire for transitional dilator, and vascular measurement was performed. A small incision was made on the right anterior chest wall and a subcutaneous pocket fashioned. The power-injectable port was positioned and its catheter tunneled to the right IJ dermatotomy site. The transitional dilator was exchanged over an Amplatz wire for a peel-away sheath, through which the port catheter, which had been trimmed to the appropriate length, was advanced and positioned under fluoroscopy with its tip at the cavoatrial junction. Spot chest radiograph confirms good catheter position and no pneumothorax. The port was flushed per protocol. The pocket was closed with deep interrupted and subcuticular continuous 3-0 Monocryl sutures. The incisions were covered with Dermabond then covered with a sterile dressing. The patient tolerated the procedure well. COMPLICATIONS: COMPLICATIONS None immediate IMPRESSION: Technically successful right IJ power-injectable port catheter placement. Ready for routine use. Electronically Signed   By: Lucrezia Europe M.D.   On: 07/14/2019 13:39    Medications: I have reviewed the patient's current medications.  Assessment/Plan: 1.Metastatic pancreatic adenocarcinoma             -CT of the abdomen pelvis on  07/02/2019 showed a 3.4 x 2.2 x 3              cm mass in the head of the pancreas with multiple ill-defined              hypodense liver masses.             -CT of the chest on 07/07/2019 showed no evidence of thoracic              metastasis.             -Ultrasound guided liver biopsy on 07/07/2019 demonstrated              metastatic pancreatic adenocarcinoma. 2.  Abdominal pain, nausea, vomiting secondary to pancreatic adenocarcinoma 3.  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  Chronicity, now with mild microcytic anemia 4.  Hypokalemia 5.  Uterine fibroids 6.  Abdominal pain secondary to #1 7.  Nausea  Rachel Vasquez appears improved.  Her pain and nausea are overall improving with OxyContin, oxycodone, and as needed antiemetics.  Bowels are moving.  Port-A-Cath placed this morning.  She is working with PT/OT.  Recommendations: 1.  We will increase the morning dose of OxyContin 20 mg and continue 10 mg at bedtime.  She will continue oxycodone as needed for breakthrough pain. 2.  Continue metoclopramide for nausea 3.  Continue prednisone as an appetite stimulant 4.  Physical therapy evaluation, increase ambulation as tolerated  Outpatient follow-up will be scheduled at the Cancer center with the plan to begin chemotherapy if her performance status improves.   LOS: 11 days   Mikey Bussing, NP   07/14/2019, 3:14 PM  Ms. Hisaw was interviewed and examined.  She has improvement in nausea and pain, but she continues to have significant back pain.  We adjusted the OxyContin dose.  The plan is to initiate systemic therapy for the metastatic pancreas cancer as an outpatient.  I attempted to contact her son in [place] twice today and was unable to reach him.  I will try again in the morning.

## 2019-07-14 NOTE — Progress Notes (Signed)
PROGRESS NOTE    Marilda Mcnabb  [id number] DOB: [birth year] DOA: 07/02/2019 PCP: Patient, [name] Per     Brief Narrative:  Patient is a 64 year old female with history of high blood pressure presenting with nausea vomiting and weight loss.  She is found to have metastatic pancreatic cancer.  Chemotherapy was started.  She had had issues with uncontrollable nausea/vomiting and pain throughout her stay.   New events last 24 hours / Subjective: To get port today. Pain/nausea are well controlled since yesterday. Concerned about BP.  She continues her amlodipine.  She also has a dry mouth.  Assessment & Plan:   Principal Problem:   Mass of pancreas  Port today, appreciate IR  Appreciate Oncology  Morphine for low grams every 3 hours as needed for severe pain  Oxycodone 5 mg every 4 hours as needed for moderate pain  OxyContin 10 mg every 12 hours   Active Problems:   Nausea & vomiting  Currently controlled  Cont Zofran prn  Cont Phenergan prn  Continue Ativan prn  Finished prednisone burst to help with appetite  Continue Reglan to help with belching  Gentle hydration 50 mL/hr NS    Essential Hypertension  Continue amlodipine 10 mg daily  Will add hydralazine 10 prn SBP >159, DBP >99    Liver lesion   Abdominal pain, epigastric  Managed as above    Microcytic anemia  Stable at 11.7 today  Monitor CBC    Pancreatic adenocarcinoma (HCC)  As above    Hypokalemia  3.1 today  Replace 40 mEq, ck this afternoon  Monitor BMP    Hypomagnesemia  Monitor  Controlled at 1.9 today  Nutrition Problem: Inadequate oral intake Etiology: nausea, vomiting   DVT prophylaxis: Lovenox Code Status: Full Family Communication: Self Disposition Plan: Home   Consultants:   Oncology  IR  Procedures:   Port placement today (07/14/19)  Antimicrobials:  Anti-infectives (From admission, onward)   Start     Dose/Rate Route Frequency Ordered Stop   07/14/19 1500  ceFAZolin  (ANCEF) IVPB 2g/100 mL premix     2 g 200 mL/hr over 30 Minutes Intravenous To Radiology 07/13/19 1009 07/15/19 1500       Objective: Vitals:   07/13/19 1552 07/13/19 2012 07/14/19 0119 07/14/19 0629  BP: (!) 151/84 (!) 161/94 (!) 164/98 (!) 152/108  Pulse: 94 90 95 96  Resp: 18 18 18 18   Temp: 98.2 F (36.8 C) 98.1 F (36.7 C) 98.8 F (37.1 C) 98.8 F (37.1 C)  TempSrc: Oral Oral Oral Oral  SpO2: 98% 98% 97% 97%  Weight:    109.9 kg  Height:        Intake/Output Summary (Last 24 hours) at 07/14/2019 1116 Last data filed at 07/14/2019 0500 Gross per 24 hour  Intake 1376.68 ml  Output --  Net 1376.68 ml   Filed Weights   07/12/19 0519 07/13/19 0557 07/14/19 0629  Weight: 110.5 kg 109.9 kg 109.9 kg    Examination:  General exam: Appears calm and comfortable  Respiratory system: Clear to auscultation. Respiratory effort normal. No respiratory distress. No conversational dyspnea.  Cardiovascular system: S1 & S2 heard, RRR. No murmurs. No pedal edema. Gastrointestinal system: Abdomen is nondistended, soft and not tender on my exam. Normal bowel sounds heard. Central nervous system: Alert and oriented. No focal neurological deficits. Speech clear.  Extremities: Symmetric in appearance  Skin: No rashes, lesions or ulcers on exposed skin  Psychiatry: Judgement and insight appear normal. Mood &  affect appropriate.   Data Reviewed: I have personally reviewed following labs and imaging studies  CBC: Recent Labs  Lab 07/10/19 0813 07/11/19 0550 07/12/19 0825 07/13/19 0342 07/14/19 0621  WBC 6.1 7.3 8.9 9.3 10.1  NEUTROABS  --   --   --   --  7.8*  HGB 11.8* 11.6* 11.4* 11.3* 11.7*  HCT 36.6 36.3 35.3* 34.3* 35.3*  MCV 72.0* 71.7* 71.3* 70.7* 69.4*  PLT 201 231 213 213 [id number]   Basic Metabolic Panel: Recent Labs  Lab 07/10/19 0813 07/11/19 0550 07/12/19 0825 07/13/19 0342 07/14/19 0621  NA 134* 133* 135 135 136  K 3.1* 3.0* 3.3* 3.3* 3.1*  CL 95* 94* 96* 99 95*   CO2 24 25 24 24 29   GLUCOSE 135* 129* 141* 136* 168*  BUN <5* <5* <5* 6* 7*  CREATININE 0.62 0.63 0.67 0.59 0.50  CALCIUM 8.8* 8.8* 8.9 8.9 9.1  MG 1.6* 1.9 1.9 1.8 1.9  PHOS  --  3.0  --   --   --    GFR: Estimated Creatinine Clearance: 90.7 mL/min (by C-G formula based on SCr of 0.5 mg/dL).  Coagulation Profile: Recent Labs  Lab 07/14/19 0621  INR 1.3*   Recent Results (from the past 240 hour(s))  Surgical pcr screen     Status: None   Collection Time: 07/13/19  9:16 PM   Specimen: Nasal Mucosa; Nasal Swab  Result Value Ref Range Status   MRSA, PCR NEGATIVE NEGATIVE Final   Staphylococcus aureus NEGATIVE NEGATIVE Final    Comment: (NOTE) The Xpert SA Assay (FDA approved for NASAL specimens in patients 28 years of age and older), is one component of a comprehensive surveillance program. It is not intended to diagnose infection nor to guide or monitor treatment. Performed at [facility], [place] [address] [place], [place] [address]     Scheduled Meds: . fentaNYL      . midazolam      . amLODipine  10 mg Oral Daily  . [START ON 07/15/2019] enoxaparin (LOVENOX) injection  40 mg Subcutaneous Q24H  . feeding supplement (PRO-STAT SUGAR FREE 64)  30 mL Oral TID  . lidocaine-EPINEPHrine  20 mL Intradermal Once  . metoCLOPramide  5 mg Oral TID AC & HS  . multivitamin with minerals  1 tablet Oral Daily  . oxyCODONE  10 mg Oral Q12H  . pantoprazole (PROTONIX) IV  40 mg Intravenous Q12H  . predniSONE  20 mg Oral Q breakfast  . sorbitol  30 mL Oral BID   Continuous Infusions: . sodium chloride 50 mL/hr at 07/13/19 0628  .  ceFAZolin (ANCEF) IV 2 g (07/14/19 1112)  . potassium chloride 10 mEq (07/14/19 0928)     LOS: 11 days   Time spent: 21 minutes   Shelda Pal, DO [facility] 07/14/2019, 11:16 AM   Available via Epic secure chat 7am-7pm After these hours, please refer to coverage provider listed on [email or web address]

## 2019-07-14 NOTE — Procedures (Signed)
  Procedure: R IJ Port placement   EBL:   minimal Complications:  none immediate  See full dictation in [facility] PACS.  D. Benno Brensinger MD Main # [phone] Pager  [phone]    

## 2019-07-14 NOTE — Evaluation (Addendum)
Physical Therapy Evaluation Patient Details Name: Rachel Vasquez MRN: [id number] DOB: Dec 28, 1955 Today's Date: 07/14/2019   History of Present Illness  Pt is a 64 y/o female who presents with nausea/vomiting and weight loss. She was found to have metastatic pancreatic cancer with liver lesion. PMH generally unremarkable aside from hiatal hernia.    Clinical Impression  Pt admitted with above diagnosis. At the time of PT eval pt was limited mainly by decreased tolerance for functional activity. Mild weakness present on MMT exam, however grossly 4/5 to 4+/5 in LE's overall. This patient will benefit from an OT consult for energy conservation and possible adaptive equipment to maintain independence with BADL's. Pt not able to don/doff her own socks this session and asking therapist to manage her bed linen for her. She will have her sister available for ~2 months at d/c to help as needed. At this time, do not feel pt will require follow-up PT, however may be something to consider in the future as cancer treatments progress, if functional independence begins to decline further. Pt currently with functional limitations due to the deficits listed below (see PT Problem List). Pt will benefit from skilled PT to increase their independence and safety with mobility to allow discharge to the venue listed below.      Follow Up Recommendations No PT follow up;Supervision for mobility/OOB    Equipment Recommendations  Rolling walker with 5" wheels;3in1 (PT)    Recommendations for Other Services       Precautions / Restrictions Precautions Precautions: Fall Restrictions Weight Bearing Restrictions: No      Mobility  Bed Mobility Overal bed mobility: Needs Assistance Bed Mobility: Supine to Sit;Sit to Supine     Supine to sit: Supervision;HOB elevated Sit to supine: Supervision;HOB elevated   General bed mobility comments: Increased time and energy to transition to/from EOB. No assist required.  Upon return to supine, pt brought RLE up onto bed and had to sit and rest for a minute before being able to bring her LLE up into the bed as well.   Transfers Overall transfer level: Needs assistance Equipment used: Rolling walker (2 wheeled) Transfers: Sit to/from Stand Sit to Stand: Supervision         General transfer comment: VC's for keeping walker closer to her before initiating stand>sit. Pt was able to control descent to low toilet in room and stand from low toilet without assistance.   Ambulation/Gait Ambulation/Gait assistance: Min guard Gait Distance (Feet): 150 Feet Assistive device: Rolling walker (2 wheeled) Gait Pattern/deviations: Step-through pattern;Decreased stride length;Wide base of support Gait velocity: Decreased Gait velocity interpretation: <1.31 ft/sec, indicative of household ambulator General Gait Details: Pt able to ambulate in the hall with gross min guard assist for safety. RW utilized for energy conservation mostly but I do think it was helpful for mild balance deficits as well. Pt picking RW up to advance each time despire cues to roll it, as pt states the sound of the walker on the floor irritates her. Overall pt moving very slow and each step appears laborious.   Stairs            Wheelchair Mobility    Modified Rankin (Stroke Patients Only)       Balance Overall balance assessment: Needs assistance Sitting-balance support: Feet supported;No upper extremity supported Sitting balance-Leahy Scale: Fair Sitting balance - Comments: Pt did not appear to have difficulty with sitting activity EOB but would not attempt to reach down to don or doff her own socks  despite being able to do so PTA.    Standing balance support: No upper extremity supported;During functional activity Standing balance-Leahy Scale: Fair Standing balance comment: Pt able to stand at the sink and wash hands without UE support                              Pertinent Vitals/Pain Pain Assessment: No/denies pain    Home Living Family/patient expects to be discharged to:: Private residence Living Arrangements: Children Available Help at Discharge: Family;Available 24 hours/day Type of Home: Apartment Home Access: Level entry     Home Layout: One level Home Equipment: None Additional Comments: Lives with son who is in and out, but states sister will be coming to stay with her for 2 months and will be available 24/7.     Prior Function Level of Independence: Independent         Comments: Retired from [employer]        Extremity/Trunk Assessment   Upper Extremity Assessment Upper Extremity Assessment: Defer to OT evaluation    Lower Extremity Assessment Lower Extremity Assessment: RLE deficits/detail RLE Deficits / Details: Gross strength 4+/5 with MMT, however it appears muscular fatigue is playing a big role in what pt feels as "weakness". She states she feels her RLE is weaker than her left during functional mobility, however her LLE was slightly weaker with MMT (4/5 grossly vs 4+/5) RLE Sensation: WNL RLE Coordination: WNL    Cervical / Trunk Assessment Cervical / Trunk Assessment: Normal  Communication      Cognition Arousal/Alertness: Awake/alert(Very sleepy but not lethargic) Behavior During Therapy: Flat affect Overall Cognitive Status: Within Functional Limits for tasks assessed                                        General Comments      Exercises     Assessment/Plan    PT Assessment Patient needs continued PT services  PT Problem List Decreased strength;Decreased activity tolerance;Decreased balance;Decreased mobility;Decreased knowledge of use of DME;Decreased safety awareness;Decreased knowledge of precautions       PT Treatment Interventions DME instruction;Gait training;Functional mobility training;Therapeutic activities;Therapeutic  exercise;Neuromuscular re-education;Patient/family education    PT Goals (Current goals can be found in the Care Plan section)  Acute Rehab PT Goals Patient Stated Goal: Return home at discharge, get stronger PT Goal Formulation: With patient Time For Goal Achievement: 07/21/19 Potential to Achieve Goals: Good    Frequency Min 3X/week   Barriers to discharge        Co-evaluation               AM-PAC PT "6 Clicks" Mobility  Outcome Measure Help needed turning from your back to your side while in a flat bed without using bedrails?: None Help needed moving from lying on your back to sitting on the side of a flat bed without using bedrails?: None Help needed moving to and from a bed to a chair (including a wheelchair)?: A Little Help needed standing up from a chair using your arms (e.g., wheelchair or bedside chair)?: A Little Help needed to walk in hospital room?: A Little Help needed climbing 3-5 steps with a railing? : A Little 6 Click Score: 20    End of Session Equipment Utilized During Treatment: Gait belt Activity Tolerance: Patient limited  by fatigue Patient left: in bed;with call bell/phone within reach;with nursing/sitter in room Nurse Communication: Mobility status PT Visit Diagnosis: Unsteadiness on feet (R26.81);Difficulty in walking, not elsewhere classified (R26.2);Muscle weakness (generalized) (M62.81)    Time: [id number] PT Time Calculation (min) (ACUTE ONLY): 32 min   Charges:   PT Evaluation $PT Eval Moderate Complexity: 1 Mod PT Treatments $Gait Training: 8-22 mins        Rolinda Roan, PT, DPT Acute Rehabilitation Services Pager: [phone] Office: [phone]   Thelma Comp 07/14/2019, 3:04 PM

## 2019-07-14 NOTE — Progress Notes (Addendum)
Nutrition Follow-up  RD working remotely.  DOCUMENTATION CODES:   Obesity unspecified  INTERVENTION:   -D/c Prostat due to poor acceptance -MVI with minerals daily -Snacks TID between meals -Magic cup TID with meals, each supplement provides 290 kcal and 9 grams of protein  NUTRITION DIAGNOSIS:   Inadequate oral intake related to nausea, vomiting as evidenced by per patient/family report.  Ongoing  GOAL:   Patient will meet greater than or equal to 90% of their needs  Progressing   MONITOR:   PO intake, Supplement acceptance  REASON FOR ASSESSMENT:   Consult Assessment of nutrition requirement/status  ASSESSMENT:   64 yo female admitted with progressive nausea, vomiting, loss of appetite, fatigue, epigastric pain. CT scan showed mass at the head of the pancreas and hypodense liver lesions. No known PMH.  1/17- per oncology notes, path reveals adenocarcinoma of pancreas with liver lesions  Reviewed I/O's: +1.7 L x 24 hours and +16.7 L since admission  RD attempted to speak with pt over the phone, however, no answer.   Pt currently NPO for port-a-cath placement today. Plan to follow with outpatient cancer center for palliative chemotherapy.   Per MD notes, feeding tube was discussed, however, pt would like to avoid this if at all possible. Noted intake has improved over the weekend (PO 25-75%). Per nursing notes, pt's nausea is relieved with reglan. She is refusing Prostat supplements. She also refused Boost Breeze supplements due to aggravating nausea. She has refused milky type supplements in the past secondary to poor tolerance.   Reviewed wt records; noted pt has experienced a 3% wt loss over the past week, which is significant for time frame. Suspect some wt loss may be related to dehydration/   Once diet is advanced, RD will order snacks between meals for pt to assist with PO intake.   Medications reviewed and include reglan, prednisone, and KCl runs.    Labs reviewed: K: 3.1 (on IV supplementation).   Diet Order:   Diet Order            Diet NPO time specified Except for: Sips with Meds  Diet effective midnight              EDUCATION NEEDS:   Education needs have been addressed  Skin:  Skin Assessment: Skin Integrity Issues: Skin Integrity Issues:: Incisions Incisions: rt upper abdomen  Last BM:  07/11/18  Height:   Ht Readings from Last 1 Encounters:  07/02/19 5\' 7"  (1.702 m)    Weight:   Wt Readings from Last 1 Encounters:  07/14/19 109.9 kg    Ideal Body Weight:  61.4 kg  BMI:  Body mass index is 37.93 kg/m.  Estimated Nutritional Needs:   Kcal:  1800-2100  Protein:  115-130 gm  Fluid:  >/= 1.8 L    Dionisios Ricci A. Jimmye Norman, RD, LDN, [place] Registered Dietitian II Certified Diabetes Care and Education Specialist Pager: [phone] After hours Pager: [phone]

## 2019-07-14 NOTE — TOC Initial Note (Signed)
Transition of Care [facility]) - Initial/Assessment Note    Patient Details  Name: Rachel Vasquez MRN: [id number] Date of Birth: 28-Nov-1955  Transition of Care [facility]) CM/SW Contact:    Marilu Favre, RN Phone Number: 07/14/2019, 4:23 PM  Clinical Narrative:                  Confirmed face sheet information. Patient lives alone, however her sister is going to stay with her at discharge.   Patient needs walker and 3 in 1 same ordered. Patient has transportation.   Patient does not have a PCP, but plans to call [employer] to be provided a list of MD's in network  Expected Discharge Plan: Home/Self Care     Patient Goals and CMS Choice Patient states their goals for this hospitalization and ongoing recovery are:: to return to home CMS Medicare.gov Compare Post Acute Care list provided to:: Patient Choice offered to / list presented to : Patient  Expected Discharge Plan and Services Expected Discharge Plan: Home/Self Care   Discharge Planning Services: CM Consult Post Acute Care Choice: Durable Medical Equipment Living arrangements for the past 2 months: Apartment                 DME Arranged: 3-N-1, Walker rolling DME Agency: [employer] Date DME Agency Contacted: 07/14/19 Time DME Agency [id number] Representative spoke with at DME Agency: [place]: NA          Prior Living Arrangements/Services Living arrangements for the past 2 months: Apartment Lives with:: Self Patient language and need for interpreter reviewed:: Yes Do you feel safe going back to the place where you live?: Yes      Need for Family Participation in Patient Care: Yes (Comment) Care giver support system in place?: Yes (comment)   Criminal Activity/Legal Involvement Pertinent to Current Situation/Hospitalization: No - Comment as needed  Activities of Daily Living Home Assistive Devices/Equipment: None ADL Screening (condition at time of admission) Patient's cognitive ability adequate to  safely complete daily activities?: Yes Is the patient deaf or have difficulty hearing?: No Does the patient have difficulty seeing, even when wearing glasses/contacts?: No Does the patient have difficulty concentrating, remembering, or making decisions?: No Patient able to express need for assistance with ADLs?: No Does the patient have difficulty dressing or bathing?: No Independently performs ADLs?: Yes (appropriate for developmental age) Does the patient have difficulty walking or climbing stairs?: No Weakness of Legs: None Weakness of Arms/Hands: None  Permission Sought/Granted   Permission granted to share information with : No              Emotional Assessment   Attitude/Demeanor/Rapport: Engaged Affect (typically observed): Accepting Orientation: : Oriented to  Time, Oriented to Situation, Oriented to Place, Oriented to Self Alcohol / Substance Use: Not Applicable Psych Involvement: No (comment)  Admission diagnosis:  Pancreatic mass [K86.89] Mass of pancreas [K86.89] Patient Active Problem List   Diagnosis Date Noted  . Pancreatic adenocarcinoma ([place]) 07/10/2019  . Hypokalemia 07/10/2019  . Hypomagnesemia 07/10/2019  . Pancreatitis 07/09/2019  . Microcytic anemia 07/09/2019  . Abdominal pain, epigastric 07/03/2019  . Pancreatic mass 07/03/2019  . GERD (gastroesophageal reflux disease)   . Hiatal hernia   . Liver lesion   . 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 Nausea & vomiting 07/02/2019   PCP:  Patient, [name] Per Pharmacy:   [id number] - [place], [place] [id number] [place]  [place] [address] Phone: [phone] Fax: [phone]     Social Determinants of Health (SDOH) Interventions    Readmission Risk Interventions No flowsheet data found.

## 2019-07-15 LAB — BASIC METABOLIC PANEL
Anion gap: 9 (ref 5–15)
Anion gap: 9 (ref 5–15)
BUN: 5 mg/dL — ABNORMAL LOW (ref 8–23)
BUN: 6 mg/dL — ABNORMAL LOW (ref 8–23)
CO2: 30 mmol/L (ref 22–32)
CO2: 27 mmol/L (ref 22–32)
Calcium: 9 mg/dL (ref 8.9–10.3)
Calcium: 8.6 mg/dL — ABNORMAL LOW (ref 8.9–10.3)
Chloride: 99 mmol/L (ref 98–111)
Chloride: 98 mmol/L (ref 98–111)
Creatinine, Ser: 0.52 mg/dL (ref 0.44–1.00)
Creatinine, Ser: 0.51 mg/dL (ref 0.44–1.00)
GFR calc Af Amer: >60 mL/min (ref >60)
GFR calc Af Amer: >60 mL/min (ref >60)
GFR calc non Af Amer: >60 mL/min (ref >60)
GFR calc non Af Amer: >60 mL/min (ref >60)
Glucose, Bld: 163 mg/dL — ABNORMAL HIGH (ref 70–99)
Glucose, Bld: 172 mg/dL — ABNORMAL HIGH (ref 70–99)
Potassium: 3.1 mmol/L — ABNORMAL LOW (ref 3.5–5.1)
Potassium: 3.5 mmol/L (ref 3.5–5.1)
Sodium: 138 mmol/L (ref 135–145)
Sodium: 134 mmol/L — ABNORMAL LOW (ref 135–145)

## 2019-07-15 LAB — CBC
HCT: 32 % — ABNORMAL LOW (ref 36.0–46.0)
Hemoglobin: 10.8 g/dL — ABNORMAL LOW (ref 12.0–15.0)
MCH: 23.1 pg — ABNORMAL LOW (ref 26.0–34.0)
MCHC: 33.8 g/dL (ref 30.0–36.0)
MCV: 68.5 fL — ABNORMAL LOW (ref 80.0–100.0)
Platelets: 154 10*3/uL (ref 150–400)
RBC: 4.67 MIL/uL (ref 3.87–5.11)
RDW: 15.1 % (ref 11.5–15.5)
WBC: 9.1 10*3/uL (ref 4.0–10.5)
nRBC: 0.3 % — ABNORMAL HIGH (ref 0.0–0.2)

## 2019-07-15 LAB — MAGNESIUM: Magnesium: 1.8 mg/dL (ref 1.7–2.4)

## 2019-07-15 MED ORDER — SODIUM CHLORIDE 0.9% FLUSH
10.0000 mL | INTRAVENOUS | Status: DC | PRN
  Administered 2019-07-17: 10 mL

## 2019-07-15 MED ORDER — POTASSIUM CHLORIDE 10 MEQ/100ML IV SOLN: 10.0000 meq | INTRAVENOUS | Status: DC

## 2019-07-15 MED ORDER — POTASSIUM CHLORIDE 10 MEQ/100ML IV SOLN
10.0000 meq | INTRAVENOUS | Status: DC
  Administered 2019-07-15 (×3): 10 meq via INTRAVENOUS
  Filled 2019-07-15: qty 100

## 2019-07-15 NOTE — Progress Notes (Signed)
PROGRESS NOTE    Rachel Vasquez  [id number] DOB: [birth year] DOA: 07/02/2019 PCP: Patient, [name] Per   Brief Narrative:  Patient is a 63 year old female with history of high blood pressure presenting with nausea vomiting and weight loss.  She is found to have metastatic pancreatic cancer.  Chemotherapy was started.  She had had issues with uncontrollable nausea/vomiting and pain throughout her stay.    Assessment & Plan:   Principal Problem:   Mass of pancreas Active Problems:   Nausea & vomiting   GERD (gastroesophageal reflux disease)   Hiatal hernia   Liver lesion   Abdominal pain, epigastric   Pancreatic mass   Pancreatitis   Microcytic anemia   Pancreatic adenocarcinoma ([facility])   Hypokalemia   Hypomagnesemia   Principal Problem:   Mass of pancreas - adenocarcinoma             Port today, appreciate IR             Appreciate Oncology             Morphine for 4 mg every 3 hours as needed for severe pain             Oxycodone 5 mg every 4 hours as needed for moderate pain             OxyContin 20 mg every am and 10 mg at hs  Offered palliative care consult, but she declines this              Active Problems:   Nausea & vomiting             Currently controlled at times             Cont Zofran prn             Cont Phenergan prn             Continue Ativan prn             Finished prednisone burst to help with appetite             Continue Reglan to help with belching             Gentle hydration 75 mL/hr NS    Essential Hypertension             Continue amlodipine 10 mg daily             Will add hydralazine 10 prn SBP >159, DBP >99    Liver lesion - metastasis   Abdominal pain, epigastric             Managed as above    Microcytic anemia             Stable at 10.8 today             Monitor CBC    Hypokalemia             3.1 today             Replace 40 mEq in IVF, increased rate, po KCL             Monitor BMP    Hypomagnesemia  Monitor             Controlled at 1.8 today  Nutrition Problem: Inadequate oral intake  Etiology: nausea, vomiting, at times declining meds and treatment   DVT prophylaxis: Lovenox Code Status: Full Family Communication: Patient Disposition Plan: Home when patient is agreeable. Attempted to  discuss home today and she indicated she is not ready.   Consultants:   Oncology  IR  Procedures:   Port placement   Antimicrobials:  Anti-infectives (From admission, onward)   Start     Dose/Rate Route Frequency Ordered Stop   07/14/19 1500  ceFAZolin (ANCEF) IVPB 2g/100 mL premix     2 g 200 mL/hr over 30 Minutes Intravenous To Radiology 07/13/19 1009 07/14/19 1900        Subjective: S/p Port placement. Reports pain is still there, might be better. More nausea this am. K+ is low. She states meds were out of sequence.  She had tele on for K repletion and not stopped. Several non-sustained runs of v.Tach without symptoms.  Objective: Vitals:   07/14/19 1927 07/14/19 2349 07/15/19 0402 07/15/19 0843  BP: (!) 159/99 (!) 136/92 (!) 141/88 (!) 151/87  Pulse: (!) 106 96 95 (!) 107  Resp: 18 16 18 16   Temp: 98 F (36.7 C) 98 F (36.7 C) 98.1 F (36.7 C) (!) 97.5 F (36.4 C)  TempSrc: Oral Oral Oral Oral  SpO2: 95% 94% 94% 96%  Weight:      Height:        Intake/Output Summary (Last 24 hours) at 07/15/2019 1110 Last data filed at 07/15/2019 0400 Gross per 24 hour  Intake 1329.28 ml  Output -  Net 1329.28 ml   Filed Weights   07/12/19 0519 07/13/19 0557 07/14/19 0629  Weight: 110.5 kg 109.9 kg 109.9 kg    Examination:  General exam: Appears calm and comfortable  Respiratory system: Clear to auscultation. Respiratory effort normal. Cardiovascular system: S1 & S2 heard, RRR. No JVD, murmurs, rubs, gallops or clicks. No pedal edema. Gastrointestinal system: Abdomen is nondistended, soft and nontender. No organomegaly or masses felt. Normal bowel sounds heard. Central  nervous system: Alert and oriented. No focal neurological deficits. Extremities: Symmetric 5 x 5 power. Skin: No rashes, lesions or ulcers Psychiatry: Judgement and insight appear normal. Mood & affect appropriate.     Data Reviewed: I have personally reviewed following labs and imaging studies  CBC: Recent Labs  Lab 07/11/19 0550 07/12/19 0825 07/13/19 0342 07/14/19 0621 07/15/19 0749  WBC 7.3 8.9 9.3 10.1 9.1  NEUTROABS  --   --   --  7.8*  --   HGB 11.6* 11.4* 11.3* 11.7* 10.8*  HCT 36.3 35.3* 34.3* 35.3* 32.0*  MCV 71.7* 71.3* 70.7* 69.4* 68.5*  PLT 231 213 213 183 [id number]   Basic Metabolic Panel: Recent Labs  Lab 07/11/19 0550 07/12/19 0825 07/13/19 0342 07/14/19 0621 07/14/19 1239 07/15/19 0749  NA 133* 135 135 136 134* 138  K 3.0* 3.3* 3.3* 3.1* 2.9* 3.1*  CL 94* 96* 99 95* 94* 99  CO2 25 24 24 29 26 30   GLUCOSE 129* 141* 136* 168* 194* 163*  BUN <5* <5* 6* 7* 7* 5*  CREATININE 0.63 0.67 0.59 0.50 0.60 0.52  CALCIUM 8.8* 8.9 8.9 9.1 9.1 9.0  MG 1.9 1.9 1.8 1.9  --  1.8  PHOS 3.0  --   --   --   --   --    GFR: Estimated Creatinine Clearance: 90.7 mL/min (by C-G formula based on SCr of 0.52 mg/dL). Liver Function Tests: No results for input(s): AST, ALT, ALKPHOS, BILITOT, PROT, ALBUMIN in the last 168 hours. No results for input(s): LIPASE, AMYLASE in the last 168 hours. No results for input(s): AMMONIA in the last 168 hours. Coagulation Profile: Recent Labs  Lab 07/14/19  0621  INR 1.3*   Cardiac Enzymes: No results for input(s): CKTOTAL, CKMB, CKMBINDEX, TROPONINI in the last 168 hours. BNP (last 3 results) No results for input(s): PROBNP in the last 8760 hours. HbA1C: No results for input(s): HGBA1C in the last 72 hours. CBG: No results for input(s): GLUCAP in the last 168 hours. Lipid Profile: No results for input(s): CHOL, HDL, LDLCALC, TRIG, CHOLHDL, LDLDIRECT in the last 72 hours. Thyroid Function Tests: No results for input(s): TSH, T4TOTAL,  FREET4, T3FREE, THYROIDAB in the last 72 hours. Anemia Panel: No results for input(s): VITAMINB12, FOLATE, FERRITIN, TIBC, IRON, RETICCTPCT in the last 72 hours. Urine analysis:    Component Value Date/Time   COLORURINE AMBER (A) 07/04/2019 0646   APPEARANCEUR CLEAR 07/04/2019 0646   LABSPEC 1.026 07/04/2019 0646   PHURINE 5.0 07/04/2019 0646   GLUCOSEU NEGATIVE 07/04/2019 0646   HGBUR MODERATE (A) 07/04/2019 0646   BILIRUBINUR SMALL (A) 07/04/2019 0646   KETONESUR 20 (A) 07/04/2019 0646   PROTEINUR 30 (A) 07/04/2019 0646   NITRITE NEGATIVE 07/04/2019 0646   LEUKOCYTESUR NEGATIVE 07/04/2019 0646   Sepsis Labs: @LABRCNTIP (procalcitonin:4,lacticidven:4)  ) Recent Results (from the past 240 hour(s))  Surgical pcr screen     Status: None   Collection Time: 07/13/19  9:16 PM   Specimen: Nasal Mucosa; Nasal Swab  Result Value Ref Range Status   MRSA, PCR NEGATIVE NEGATIVE Final   Staphylococcus aureus NEGATIVE NEGATIVE Final    Comment: (NOTE) The Xpert SA Assay (FDA approved for NASAL specimens in patients 76 years of age and older), is one component of a comprehensive surveillance program. It is not intended to diagnose infection nor to guide or monitor treatment. Performed at [facility], [place] [address], [place], [place] [address]          Radiology Studies: IR IMAGING GUIDED PORT INSERTION  Result Date: 07/14/2019 CLINICAL DATA:  Pancreatic carcinoma, needs poor for planned chemotherapy regimen EXAM: TUNNELED PORT CATHETER PLACEMENT WITH ULTRASOUND AND FLUOROSCOPIC GUIDANCE FLUOROSCOPY TIME:  0.6 minute; 52  uGym2 DAP ANESTHESIA/SEDATION: Intravenous Fentanyl 57mcg and Versed 2mg  were administered as conscious sedation during continuous monitoring of the patient's level of consciousness and physiological / cardiorespiratory status by the radiology RN, with a total moderate sedation time of 21 minutes. TECHNIQUE: The procedure, risks, benefits, and alternatives were  explained to the patient. Questions regarding the procedure were encouraged and answered. The patient understands and consents to the procedure. As antibiotic prophylaxis, cefazolin 2 g was ordered pre-procedure and administered intravenously within one hour of incision. Patency of the right IJ vein was confirmed with ultrasound with image documentation. An appropriate skin site was determined. Skin site was marked. Region was prepped using maximum barrier technique including cap and mask, sterile gown, sterile gloves, large sterile sheet, and Chlorhexidine as cutaneous antisepsis. The region was infiltrated locally with 1% lidocaine. Under real-time ultrasound guidance, the right IJ vein was accessed with a 21 gauge micropuncture needle; the needle tip within the vein was confirmed with ultrasound image documentation. Needle was exchanged over a 018 guidewire for transitional dilator, and vascular measurement was performed. A small incision was made on the right anterior chest wall and a subcutaneous pocket fashioned. The power-injectable port was positioned and its catheter tunneled to the right IJ dermatotomy site. The transitional dilator was exchanged over an Amplatz wire for a peel-away sheath, through which the port catheter, which had been trimmed to the appropriate length, was advanced and positioned under fluoroscopy with its  tip at the cavoatrial junction. Spot chest radiograph confirms good catheter position and no pneumothorax. The port was flushed per protocol. The pocket was closed with deep interrupted and subcuticular continuous 3-0 Monocryl sutures. The incisions were covered with Dermabond then covered with a sterile dressing. The patient tolerated the procedure well. COMPLICATIONS: COMPLICATIONS None immediate IMPRESSION: Technically successful right IJ power-injectable port catheter placement. Ready for routine use. Electronically Signed   By: Lucrezia Europe M.D.   On: 07/14/2019 13:39         Scheduled Meds: . amLODipine  10 mg Oral Daily  . Chlorhexidine Gluconate Cloth  6 each Topical Daily  . diphenhydrAMINE  12.5 mg Intravenous Once  . enoxaparin (LOVENOX) injection  40 mg Subcutaneous Q24H  . lidocaine-EPINEPHrine  20 mL Intradermal Once  . metoCLOPramide  5 mg Oral TID AC & HS  . multivitamin with minerals  1 tablet Oral Daily  . oxyCODONE  10 mg Oral QHS  . oxyCODONE  20 mg Oral q morning - 10a  . pantoprazole  40 mg Oral BID  . potassium chloride  40 mEq Oral BID  . predniSONE  20 mg Oral Q breakfast  . sorbitol  30 mL Oral BID   Continuous Infusions: . 0.9 % NaCl with KCl 40 mEq / L 50 mL/hr (07/14/19 1706)     LOS: 12 days    Donnamae Jude, MD [facility] Pager [phone]  If 7PM-7AM, please contact night-coverage [email or web address] Password [email or web address] 07/15/2019, 11:10 AM

## 2019-07-15 NOTE — Progress Notes (Signed)
Pt has refused all her morning medications except for Lovenox, Reglan and OxyContin. RN educated on the importance of taking her medications, and pt says she is too "nauseas". MD made aware,  Nausea medicine administered. Will continue to try,

## 2019-07-15 NOTE — Progress Notes (Signed)
CCMD reports pt had a 15 beat run of V-tach . Pt checked ; pt reporting feeling nauseas.  MD Kennon Rounds made aware. Awaiting new orders.

## 2019-07-15 NOTE — Progress Notes (Addendum)
IP PROGRESS NOTE  Subjective:   Reports some nausea and vomiting this afternoon.  I note that she has declined all of her medications this morning except for her OxyContin, Lovenox, and Reglan.  Has not had any food for lunch.  Reports that pain is currently controlled.  Has not really been out of bed today.  Objective: Vital signs in last 24 hours: Blood pressure (!) 151/87, pulse (!) 107, temperature (!) 97.5 F (36.4 C), temperature source Oral, resp. rate 16, height 5\' 7"  (1.702 m), weight 242 lb 3.2 oz (109.9 kg), SpO2 96 %.  Intake/Output from previous day: 01/11 0701 - 01/12 0700 In: 1329.3 [I.V.:892.6; IV Piggyback:436.6] Out: -   Physical Exam:  Musculoskeletal: Pain at the bilateral lower back, no tenderness Abdomen: Soft and nontender, no mass   Lab Results: Recent Labs    07/14/19 0621 07/15/19 0749  WBC 10.1 9.1  HGB 11.7* 10.8*  HCT 35.3* 32.0*  PLT 183 154    BMET Recent Labs    07/14/19 1239 07/15/19 0749  NA 134* 138  K 2.9* 3.1*  CL 94* 99  CO2 26 30  GLUCOSE 194* 163*  BUN 7* 5*  CREATININE 0.60 0.52  CALCIUM 9.1 9.0    No results found for: CEA1  Studies/Results: IR IMAGING GUIDED PORT INSERTION  Result Date: 07/14/2019 CLINICAL DATA:  Pancreatic carcinoma, needs poor for planned chemotherapy regimen EXAM: TUNNELED PORT CATHETER PLACEMENT WITH ULTRASOUND AND FLUOROSCOPIC GUIDANCE FLUOROSCOPY TIME:  0.6 minute; 52  uGym2 DAP ANESTHESIA/SEDATION: Intravenous Fentanyl 74mcg and Versed 2mg  were administered as conscious sedation during continuous monitoring of the patient's level of consciousness and physiological / cardiorespiratory status by the radiology RN, with a total moderate sedation time of 21 minutes. TECHNIQUE: The procedure, risks, benefits, and alternatives were explained to the patient. Questions regarding the procedure were encouraged and answered. The patient understands and consents to the procedure. As antibiotic prophylaxis,  cefazolin 2 g was ordered pre-procedure and administered intravenously within one hour of incision. Patency of the right IJ vein was confirmed with ultrasound with image documentation. An appropriate skin site was determined. Skin site was marked. Region was prepped using maximum barrier technique including cap and mask, sterile gown, sterile gloves, large sterile sheet, and Chlorhexidine as cutaneous antisepsis. The region was infiltrated locally with 1% lidocaine. Under real-time ultrasound guidance, the right IJ vein was accessed with a 21 gauge micropuncture needle; the needle tip within the vein was confirmed with ultrasound image documentation. Needle was exchanged over a 018 guidewire for transitional dilator, and vascular measurement was performed. A small incision was made on the right anterior chest wall and a subcutaneous pocket fashioned. The power-injectable port was positioned and its catheter tunneled to the right IJ dermatotomy site. The transitional dilator was exchanged over an Amplatz wire for a peel-away sheath, through which the port catheter, which had been trimmed to the appropriate length, was advanced and positioned under fluoroscopy with its tip at the cavoatrial junction. Spot chest radiograph confirms good catheter position and no pneumothorax. The port was flushed per protocol. The pocket was closed with deep interrupted and subcuticular continuous 3-0 Monocryl sutures. The incisions were covered with Dermabond then covered with a sterile dressing. The patient tolerated the procedure well. COMPLICATIONS: COMPLICATIONS None immediate IMPRESSION: Technically successful right IJ power-injectable port catheter placement. Ready for routine use. Electronically Signed   By: Lucrezia Europe M.D.   On: 07/14/2019 13:39    Medications: I have reviewed the patient's current  medications.  Assessment/Plan: 1.Metastatic pancreatic adenocarcinoma             -CT of the abdomen pelvis on 07/02/2019  showed a 3.4 x 2.2 x 3              cm mass in the head of the pancreas with multiple ill-defined              hypodense liver masses.             -CT of the chest on 07/07/2019 showed no evidence of thoracic              metastasis.             -Ultrasound guided liver biopsy on 07/07/2019 demonstrated              metastatic pancreatic adenocarcinoma. 2.  Abdominal pain, nausea, vomiting secondary to pancreatic adenocarcinoma 3.  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  Chronicity, now with mild microcytic anemia 4.  Hypokalemia 5.  Uterine fibroids 6.  Abdominal pain secondary to #1 7.  Nausea  Ms. Howry appears stable.  Pain is currently well controlled with OxyContin and oxycodone.  She reports ongoing nausea and vomiting today.  Try to take her oral antiemetics so that she can eat.  Declined OT treatment earlier today.  Recommendations: 1.  Continue current dose of OxyContin and oxycodone. 2.  Continue metoclopramide for nausea 3.  Continue prednisone as an appetite stimulant 4.  I have encouraged her to try to eat and drink and work with therapy as much as possible to improve her functional status.  We discussed that she needs to be as strong as possible prior to beginning chemotherapy.  I have noted that her CA 19.9 drawn on 07/12/2019 has not yet resulted.  I have spoken with [facility] who is looking into this.  They will call me back with more information.   LOS: 12 days   Mikey Bussing, NP   07/15/2019, 1:47 PM  Ms. Zalar was seen this morning.  Her overall status has improved.  Her pain is under better control and she has been able to eat over the past 1-2 days.  A Port-A-Cath is in place.  We will plan for outpatient gemcitabine/Abraxane early next week if she continues to improve.  I recommend increasing ambulation and oral intake as tolerated.  I reviewed potential toxicities associated with the gemcitabine/Abraxane regimen.  She understands the potential for  nausea, alopecia, and hematologic toxicity.  We discussed the rash, fever, and pneumonitis associated with gemcitabine.  We reviewed the neuropathy seen with Abraxane.  She will attend a chemotherapy teaching class.  I discussed the case with her son by telephone this morning.

## 2019-07-15 NOTE — Progress Notes (Signed)
CCMD reports pt had a 4 beat run of V-tach . Pt checked, pt resting.  MD Kennon Rounds made aware. Awaiting new orders.

## 2019-07-15 NOTE — Progress Notes (Signed)
OT Cancellation Note  Patient Details Name: Rachel Vasquez MRN: [id number] DOB: [birth year]   Cancelled Treatment:    Reason Eval/Treat Not Completed: Patient declined, no reason specified- pt reporting "I don't feel well right now, can you come back later?". Will follow and see as able.   Jolaine Artist, OT Acute Rehabilitation Services Pager [phone] Office [phone]    Rachel Vasquez 07/15/2019, 10:28 AM

## 2019-07-15 NOTE — Plan of Care (Signed)
  Problem: Nutrition: Goal: Adequate nutrition will be maintained Outcome: Not Progressing   

## 2019-07-15 NOTE — Progress Notes (Signed)
Pt vitals WDL. Pt had no further complaints throughout the night. While patient was resting, she had occasional dysrhythmias , provider made aware.

## 2019-07-16 ENCOUNTER — Encounter: Payer: Self-pay | Admitting: *Deleted

## 2019-07-16 ENCOUNTER — Other Ambulatory Visit: Payer: Self-pay | Admitting: Oncology

## 2019-07-16 ENCOUNTER — Other Ambulatory Visit: Payer: Self-pay | Admitting: *Deleted

## 2019-07-16 ENCOUNTER — Ambulatory Visit: Payer: BC Managed Care – PPO | Admitting: Nurse Practitioner

## 2019-07-16 DIAGNOSIS — Z7189 Other specified counseling: Secondary | ICD-10-CM

## 2019-07-16 DIAGNOSIS — K8689 Other specified diseases of pancreas: Secondary | ICD-10-CM

## 2019-07-16 LAB — CBC
HCT: 32.1 % — ABNORMAL LOW (ref 36.0–46.0)
Hemoglobin: 10.5 g/dL — ABNORMAL LOW (ref 12.0–15.0)
MCH: 22.8 pg — ABNORMAL LOW (ref 26.0–34.0)
MCHC: 32.7 g/dL (ref 30.0–36.0)
MCV: 69.8 fL — ABNORMAL LOW (ref 80.0–100.0)
Platelets: 158 10*3/uL (ref 150–400)
RBC: 4.6 MIL/uL (ref 3.87–5.11)
RDW: 15.9 % — ABNORMAL HIGH (ref 11.5–15.5)
WBC: 10.4 10*3/uL (ref 4.0–10.5)
nRBC: 0.8 % — ABNORMAL HIGH (ref 0.0–0.2)

## 2019-07-16 LAB — HEPATIC FUNCTION PANEL
ALT: 337 U/L — ABNORMAL HIGH (ref 0–44)
AST: 222 U/L — ABNORMAL HIGH (ref 15–41)
Albumin: 2.3 g/dL — ABNORMAL LOW (ref 3.5–5.0)
Alkaline Phosphatase: 496 U/L — ABNORMAL HIGH (ref 38–126)
Bilirubin, Direct: 11.2 mg/dL — ABNORMAL HIGH (ref 0.0–0.2)
Indirect Bilirubin: 6.6 mg/dL — ABNORMAL HIGH (ref 0.3–0.9)
Total Bilirubin: 17.8 mg/dL — ABNORMAL HIGH (ref 0.3–1.2)
Total Protein: 6.6 g/dL (ref 6.5–8.1)

## 2019-07-16 LAB — BASIC METABOLIC PANEL
Anion gap: 9 (ref 5–15)
BUN: 6 mg/dL — ABNORMAL LOW (ref 8–23)
CO2: 26 mmol/L (ref 22–32)
Calcium: 9 mg/dL (ref 8.9–10.3)
Chloride: 101 mmol/L (ref 98–111)
Creatinine, Ser: 0.53 mg/dL (ref 0.44–1.00)
GFR calc Af Amer: >60 mL/min (ref >60)
GFR calc non Af Amer: >60 mL/min (ref >60)
Glucose, Bld: 137 mg/dL — ABNORMAL HIGH (ref 70–99)
Potassium: 3.9 mmol/L (ref 3.5–5.1)
Sodium: 136 mmol/L (ref 135–145)

## 2019-07-16 LAB — CANCER ANTIGEN 19-9: CA 19-9: 2791 U/mL — ABNORMAL HIGH (ref 0–35)

## 2019-07-16 LAB — MAGNESIUM: Magnesium: 1.8 mg/dL (ref 1.7–2.4)

## 2019-07-16 MED ORDER — LIDOCAINE-PRILOCAINE 2.5-2.5 % EX CREA: 1.0000 "application " | TOPICAL_CREAM | CUTANEOUS | 1 refills | Status: DC | PRN

## 2019-07-16 MED ORDER — SODIUM CHLORIDE 0.9 % IV SOLN: INTRAVENOUS | Status: DC

## 2019-07-16 MED ORDER — ONDANSETRON 4 MG PO TBDP
4.0000 mg | ORAL_TABLET | Freq: Four times a day (QID) | ORAL | Status: DC
  Administered 2019-07-16 – 2019-07-17 (×3): 4 mg via ORAL
  Filled 2019-07-16 (×4): qty 1

## 2019-07-16 MED ORDER — PROCHLORPERAZINE MALEATE 10 MG PO TABS: 10.0000 mg | ORAL_TABLET | Freq: Four times a day (QID) | ORAL | 1 refills | Status: DC | PRN

## 2019-07-16 MED ORDER — ONDANSETRON HCL 8 MG PO TABS: 8.0000 mg | ORAL_TABLET | Freq: Two times a day (BID) | ORAL | 1 refills | Status: DC | PRN

## 2019-07-16 MED ORDER — PREDNISONE 10 MG PO TABS
10.0000 mg | ORAL_TABLET | Freq: Every day | ORAL | Status: DC
  Administered 2019-07-17: 10 mg via ORAL
  Filled 2019-07-16: qty 1

## 2019-07-16 NOTE — Progress Notes (Signed)
START OFF PATHWAY REGIMEN - Pancreatic Adenocarcinoma   [id number]:Nab-Paclitaxel (Abraxane) 125 mg/m2 D1, 8, 15 + Gemcitabine 1,000 mg/m2 D1, 8, 15 q28 Days:   A cycle is every 28 days:     Nab-paclitaxel (protein bound)      Gemcitabine   **Always confirm dose/schedule in your pharmacy ordering system**  Patient Characteristics: Metastatic Disease, First Line, PS = 0,1, BRCA1/2 and PALB2  Mutation Absent/Unknown Current evidence of distant metastases<= Yes AJCC T Category: Staged < 8th Ed. AJCC N Category: Staged < 8th Ed. AJCC M Category: Staged < 8th Ed. AJCC 8 Stage Grouping: Staged < 8th Ed. Line of Therapy: First Line ECOG Performance Status: 1 BRCA1/2 Mutation Status: Awaiting Test Results PALB2 Mutation Status: Awaiting Test Results Intent of Therapy: Non-Curative / Palliative Intent, Discussed with Patient 

## 2019-07-16 NOTE — Progress Notes (Signed)
Physical Therapy Treatment Patient Details Name: Rachel Vasquez MRN: [id number] DOB: 25-Jan-1956 Today's Date: 07/16/2019    History of Present Illness Pt is a 64 y/o female who presents with nausea/vomiting and weight loss. She was found to have metastatic pancreatic cancer with liver lesion. PMH generally unremarkable aside from hiatal hernia.    PT Comments    Pt able to ambulate with RW with fatigue noted at the end of gait.  Discussed how to manage the steps to enter home, which have sidewalk between each step.  Her RW and 3-1 BSC have been delivered.  Her sister will be coming and staying with her.   Follow Up Recommendations  No PT follow up;Supervision for mobility/OOB     Equipment Recommendations  Rolling walker with 5" wheels;3in1 (PT)(already delivered)    Recommendations for Other Services       Precautions / Restrictions Precautions Precautions: Fall Restrictions Weight Bearing Restrictions: No    Mobility  Bed Mobility Overal bed mobility: Modified Independent         Sit to supine: Modified independent (Device/Increase time)   General bed mobility comments: increased time to get legs up on the bed  Transfers Overall transfer level: Needs assistance Equipment used: Rolling walker (2 wheeled) Transfers: Sit to/from Stand Sit to Stand: Modified independent (Device/Increase time);Supervision         General transfer comment: S at EOB with cues for hand placement. MOD I from toilet  Ambulation/Gait Ambulation/Gait assistance: Min guard;Supervision Gait Distance (Feet): 215 Feet Assistive device: Rolling walker (2 wheeled) Gait Pattern/deviations: Step-through pattern;Decreased stride length;Decreased dorsiflexion - right;Decreased dorsiflexion - left Gait velocity: Decreased   General Gait Details: Pt had just begun walk with nurse when PT arrived and PT assumed care. As gait progressed, she appeared more fatigued with decreased ankle df, but still  clearing floor. Cues to stay within RW   Stairs         General stair comments: verbally discussed how to manage steps. She has 3 with sidewalk between each step.   Wheelchair Mobility    Modified Rankin (Stroke Patients Only)       Balance   Sitting-balance support: Feet supported;No upper extremity supported Sitting balance-Leahy Scale: Good     Standing balance support: No upper extremity supported;During functional activity Standing balance-Leahy Scale: Fair                              Cognition Arousal/Alertness: Awake/alert Behavior During Therapy: Flat affect Overall Cognitive Status: Within Functional Limits for tasks assessed                                        Exercises      General Comments General comments (skin integrity, edema, etc.): RW and BSC had been delivered to her room. Reviewed use of 3-1 BSC.      Pertinent Vitals/Pain Pain Assessment: No/denies pain    Home Living                      Prior Function            PT Goals (current goals can now be found in the care plan section) Acute Rehab PT Goals Patient Stated Goal: Return home at discharge, get stronger Potential to Achieve Goals: Good Progress towards PT goals: Progressing toward goals  Frequency    Min 3X/week      PT Plan Current plan remains appropriate    Co-evaluation              AM-PAC PT "6 Clicks" Mobility   Outcome Measure  Help needed turning from your back to your side while in a flat bed without using bedrails?: None Help needed moving from lying on your back to sitting on the side of a flat bed without using bedrails?: None Help needed moving to and from a bed to a chair (including a wheelchair)?: A Little Help needed standing up from a chair using your arms (e.g., wheelchair or bedside chair)?: A Little Help needed to walk in hospital room?: A Little Help needed climbing 3-5 steps with a railing? : A  Little 6 Click Score: 20    End of Session   Activity Tolerance: Patient tolerated treatment well Patient left: with call bell/phone within reach;in bed Nurse Communication: Mobility status PT Visit Diagnosis: Unsteadiness on feet (R26.81);Difficulty in walking, not elsewhere classified (R26.2);Muscle weakness (generalized) (M62.81)     Time: [id number] PT Time Calculation (min) (ACUTE ONLY): 21 min  Charges:  $Gait Training: 8-22 mins                     Rachel Vasquez, [place] Pager [phone] 07/16/2019    Rachel Vasquez 07/16/2019, 1:50 PM

## 2019-07-16 NOTE — Progress Notes (Signed)
PROGRESS NOTE    Rachel Vasquez  [id number] DOB: Nov 30, 1955 DOA: 07/02/2019 PCP: Patient, [name] Per   Brief Narrative:  Patient is a 64 year old female with history of high blood pressure presenting with nausea vomiting and weight loss. She is found to have metastatic pancreatic cancer. Chemotherapy was started. She had had issues with uncontrollable nausea/vomiting and pain throughout her stay.  Assessment & Plan:   Principal Problem:   Mass of pancreas Active Problems:   Nausea & vomiting   GERD (gastroesophageal reflux disease)   Hiatal hernia   Liver lesion   Abdominal pain, epigastric   Pancreatic mass   Pancreatitis   Microcytic anemia   Pancreatic adenocarcinoma (HCC)   Hypokalemia   Hypomagnesemia  Principal Problem: Mass of pancreas - adenocarcinoma Port placed Appreciate Oncology Morphine for 4 mg every 3 hours as needed for severe pain Oxycodone 5 mg every 4 hours as needed for moderate pain OxyContin 20 mg every am and 10 mg at hs             Offered palliative care consult, but she declines this  Active Problems: Nausea &vomiting - not improving Currently controlled at times Cont Zofran scheduled  Reglan scheduled Continue Ativanprn Finished prednisone burst to help with appetite Gentle hydration 75 mL/hr NS  Essential Hypertension Continue amlodipine 10 mg daily Will add hydralazine 10 prn SBP >159, DBP >99  Liver lesion - metastasis            Abdominal pain, epigastric Managed as above  Microcytic anemia Stable at 10.8 today Monitor CBC  Hypokalemia 3.9 today Replace 40 mEq in IVF, increased rate, po KCL, IV repletion yesterday Monitor  BMP  Hypomagnesemia Monitor Controlled at 1.8 today  Nutrition Problem: Inadequate oral intake             Etiology: nausea, vomiting, at times declining meds and treatment    DVT prophylaxis: Lovenox Code Status: Full Family Communication: patient and sister Disposition Plan: Home when agreeable, attempted to introduce palliative care--she is not ready--continued need for emesis/nausea control  Consultants:   Oncology  IR  Procedures:   Port placed  Antimicrobials:  Anti-infectives (From admission, onward)   Start     Dose/Rate Route Frequency Ordered Stop   07/14/19 1500  ceFAZolin (ANCEF) IVPB 2g/100 mL premix     2 g 200 mL/hr over 30 Minutes Intravenous To Radiology 07/13/19 1009 07/14/19 1900        Subjective: Lots of dry heaving this am Per Oncology--set up for chemo on 01/18--needs to be discharged prior to that  Objective: Vitals:   07/15/19 2239 07/16/19 0615 07/16/19 1000 07/16/19 1152  BP: 140/84 138/82 (!) 137/104 (!) 148/98  Pulse: (!) 103 97 98 (!) 103  Resp: 18 18 17 18   Temp: 98.3 F (36.8 C) 98.5 F (36.9 C) 98.8 F (37.1 C) 98.3 F (36.8 C)  TempSrc: Oral Oral Oral Oral  SpO2: 97% 96% 96% 99%  Weight:  110.5 kg    Height:        Intake/Output Summary (Last 24 hours) at 07/16/2019 1401 Last data filed at 07/16/2019 0300 Gross per 24 hour  Intake 1721.77 ml  Output --  Net 1721.77 ml   Filed Weights   07/13/19 0557 07/14/19 0629 07/16/19 0615  Weight: 109.9 kg 109.9 kg 110.5 kg    Examination:  General exam: Appears calm and comfortable  Respiratory system: Clear to auscultation. Respiratory effort normal. Cardiovascular system: S1 & S2 heard, RRR. No  JVD, murmurs, rubs, gallops or clicks. No pedal edema. Gastrointestinal system: Abdomen is nondistended, soft and nontender. No organomegaly or masses felt. Normal bowel sounds heard. Central nervous system: Alert and oriented. No focal neurological  deficits. Extremities: Symmetric 5 x 5 power. Skin: No rashes, lesions or ulcers Psychiatry: Judgement and insight appear normal. Mood & affect appropriate.     Data Reviewed: I have personally reviewed following labs and imaging studies  CBC: Recent Labs  Lab 07/12/19 0825 07/13/19 0342 07/14/19 0621 07/15/19 0749 07/16/19 0308  WBC 8.9 9.3 10.1 9.1 10.4  NEUTROABS  --   --  7.8*  --   --   HGB 11.4* 11.3* 11.7* 10.8* 10.5*  HCT 35.3* 34.3* 35.3* 32.0* 32.1*  MCV 71.3* 70.7* 69.4* 68.5* 69.8*  PLT 213 213 183 154 0000000   Basic Metabolic Panel: Recent Labs  Lab 07/11/19 0550 07/12/19 0825 07/13/19 0342 07/14/19 0621 07/14/19 1239 07/15/19 0749 07/15/19 1948 07/16/19 0308  NA 133* 135 135 136 134* 138 134* 136  K 3.0* 3.3* 3.3* 3.1* 2.9* 3.1* 3.5 3.9  CL 94* 96* 99 95* 94* 99 98 101  CO2 25 24 24 29 26 30 27 26   GLUCOSE 129* 141* 136* 168* 194* 163* 172* 137*  BUN <5* <5* 6* 7* 7* 5* 6* 6*  CREATININE 0.63 0.67 0.59 0.50 0.60 0.52 0.51 0.53  CALCIUM 8.8* 8.9 8.9 9.1 9.1 9.0 8.6* 9.0  MG 1.9 1.9 1.8 1.9  --  1.8  --  1.8  PHOS 3.0  --   --   --   --   --   --   --    GFR: Estimated Creatinine Clearance: 91.1 mL/min (by C-G formula based on SCr of 0.53 mg/dL). Liver Function Tests: Recent Labs  Lab 07/16/19 1133  AST 222*  ALT 337*  ALKPHOS 496*  BILITOT 17.8*  PROT 6.6  ALBUMIN 2.3*   No results for input(s): LIPASE, AMYLASE in the last 168 hours. No results for input(s): AMMONIA in the last 168 hours. Coagulation Profile: Recent Labs  Lab 07/14/19 0621  INR 1.3*   Urine analysis:    Component Value Date/Time   COLORURINE AMBER (A) 07/04/2019 0646   APPEARANCEUR CLEAR 07/04/2019 0646   LABSPEC 1.026 07/04/2019 0646   PHURINE 5.0 07/04/2019 0646   GLUCOSEU NEGATIVE 07/04/2019 0646   HGBUR MODERATE (A) 07/04/2019 0646   BILIRUBINUR SMALL (A) 07/04/2019 0646   KETONESUR 20 (A) 07/04/2019 0646   PROTEINUR 30 (A) 07/04/2019 0646   NITRITE NEGATIVE  07/04/2019 0646   LEUKOCYTESUR NEGATIVE 07/04/2019 0646   Sepsis Labs:  Recent Results (from the past 240 hour(s))  Surgical pcr screen     Status: None   Collection Time: 07/13/19  9:16 PM   Specimen: Nasal Mucosa; Nasal Swab  Result Value Ref Range Status   MRSA, PCR NEGATIVE NEGATIVE Final   Staphylococcus aureus NEGATIVE NEGATIVE Final    Comment: (NOTE) The Xpert SA Assay (FDA approved for NASAL specimens in patients 28 years of age and older), is one component of a comprehensive surveillance program. It is not intended to diagnose infection nor to guide or monitor treatment. Performed at [facility], [place] [address], [place], [place] [address]       Scheduled Meds: . amLODipine  10 mg Oral Daily  . Chlorhexidine Gluconate Cloth  6 each Topical Daily  . diphenhydrAMINE  12.5 mg Intravenous Once  . enoxaparin (LOVENOX) injection  40 mg Subcutaneous Q24H  .  lidocaine-EPINEPHrine  20 mL Intradermal Once  . metoCLOPramide  5 mg Oral TID AC & HS  . multivitamin with minerals  1 tablet Oral Daily  . ondansetron  4 mg Oral Q6H  . oxyCODONE  10 mg Oral QHS  . oxyCODONE  20 mg Oral q morning - 10a  . pantoprazole  40 mg Oral BID  . potassium chloride  40 mEq Oral BID  . [START ON 07/17/2019] predniSONE  10 mg Oral Q breakfast  . sorbitol  30 mL Oral BID   Continuous Infusions: . sodium chloride 75 mL/hr at 07/16/19 0506     LOS: 13 days    Donnamae Jude, MD [facility] Pager [phone]  If 7PM-7AM, please contact night-coverage [email or web address] Password [facility] 07/16/2019, 2:01 PM

## 2019-07-16 NOTE — Evaluation (Signed)
Occupational Therapy Evaluation Patient Details Name: Rachel Vasquez MRN: [id number] DOB: September 24, 1955 Today's Date: 07/16/2019    History of Present Illness Pt is a 64 y/o female who presents with nausea/vomiting and weight loss. She was found to have metastatic pancreatic cancer with liver lesion. PMH generally unremarkable aside from hiatal hernia.   Clinical Impression   Pt PTA: Independent and recently retired. Pt currently lethargic, but willing to participate. Pt education provided for energy conservation for ADL/IADL and proper body mechanics. Handout provided. Pt issued DME already in room [facility] and RW. Pt reports that her sister will perform IADLs (meals and cleaning). Pt appears with flat affect and reports no nausea. Pt denying need for more than rolling in bed. OT session focusing on energy conservation to translate into home environment. Pt does not require continued OT skilled services. OT signing off.    Follow Up Recommendations  No OT follow up;Supervision - Intermittent    Equipment Recommendations  3 in 1 bedside commode    Recommendations for Other Services       Precautions / Restrictions Precautions Precautions: Fall Restrictions Weight Bearing Restrictions: No      Mobility Bed Mobility Overal bed mobility: Needs Assistance Bed Mobility: Rolling Rolling: Modified independent (Device/Increase time)         General bed mobility comments: Pt denying need to sit EOB for mobility  Transfers                 General transfer comment: Pt denying need to sit EOB    Balance                                           ADL either performed or assessed with clinical judgement   ADL Overall ADL's : Modified independent                                       General ADL Comments: requires increased time     Vision Baseline Vision/History: No visual deficits Patient Visual Report: No change from baseline Vision  Assessment?: No apparent visual deficits     Perception     Praxis      Pertinent Vitals/Pain Pain Assessment: No/denies pain     Hand Dominance Right   Extremity/Trunk Assessment Upper Extremity Assessment Upper Extremity Assessment: Overall WFL for tasks assessed   Lower Extremity Assessment Lower Extremity Assessment: Defer to PT evaluation       Communication Communication Communication: No difficulties   Cognition Arousal/Alertness: Awake/alert Behavior During Therapy: Flat affect Overall Cognitive Status: Within Functional Limits for tasks assessed                                     General Comments  Energy conservation handout provided for ADL/IADL and proper body mechanics.    Exercises     Shoulder Instructions      Home Living Family/patient expects to be discharged to:: Private residence Living Arrangements: Children Available Help at Discharge: Family;Available 24 hours/day Type of Home: Apartment Home Access: Level entry     Home Layout: One level     Bathroom Shower/Tub: Teacher, early years/pre: Standard     Home Equipment: None   Additional  Comments: Lives with son who is in and out, but states sister will be coming to stay with her for 2 months and will be available 24/7.       Prior Functioning/Environment Level of Independence: Independent        Comments: Retired from [employer]        OT Problem List: Decreased activity tolerance      OT Treatment/Interventions:      OT Goals(Current goals can be found in the care plan section) Acute Rehab OT Goals Patient Stated Goal: Return home at discharge, get stronger  OT Frequency:     Barriers to D/C:            Co-evaluation              AM-PAC OT "6 Clicks" Daily Activity     Outcome Measure Help from another person eating meals?: None Help from another person taking care of personal grooming?: None Help from another person  toileting, which includes using toliet, bedpan, or urinal?: None Help from another person bathing (including washing, rinsing, drying)?: None Help from another person to put on and taking off regular upper body clothing?: None Help from another person to put on and taking off regular lower body clothing?: None 6 Click Score: 24   End of Session Nurse Communication: Other (comment)(energy conservation)  Activity Tolerance: Patient limited by fatigue Patient left: in bed;with call bell/phone within reach  OT Visit Diagnosis: Muscle weakness (generalized) (M62.81)                Time: [id number] OT Time Calculation (min): 11 min Charges:  OT General Charges $OT Visit: 1 Visit OT Evaluation $OT Eval Low Complexity: Benoit C OTR/L Acute Rehabilitation Services Pager: [phone] Office: [phone]  Theda Payer C 07/16/2019, 9:36 AM

## 2019-07-16 NOTE — Progress Notes (Signed)
Per Dr. Benay Spice: Needs lab/flush/OV and Gemzar w/Abraxane on 07/21/19. Also in need of chemo education. High priority scheduling message sent.

## 2019-07-16 NOTE — Plan of Care (Signed)

## 2019-07-16 NOTE — Progress Notes (Signed)
IP PROGRESS NOTE  Subjective:   Rachel Vasquez reports the pain is better.  She continues to have intermittent episodes of nausea and vomiting.  No bowel movement for several days. Objective: Vital signs in last 24 hours: Blood pressure (!) 137/104, pulse 98, temperature 98.8 F (37.1 C), temperature source Oral, resp. rate 17, height 5\' 7"  (1.702 m), weight 243 lb 11.2 oz (110.5 kg), SpO2 96 %.  Intake/Output from previous day: 01/12 0701 - 01/13 0700 In: 1721.8 [P.O.:240; I.V.:1359.8; IV Piggyback:121.9] Out: -   Physical Exam:  Abdomen: Soft and nontender, no mass Vascular: No leg edema  Port-A-Cath without erythema   Lab Results: Recent Labs    07/15/19 0749 07/16/19 0308  WBC 9.1 10.4  HGB 10.8* 10.5*  HCT 32.0* 32.1*  PLT 154 158    BMET Recent Labs    07/15/19 1948 07/16/19 0308  NA 134* 136  K 3.5 3.9  CL 98 101  CO2 27 26  GLUCOSE 172* 137*  BUN 6* 6*  CREATININE 0.51 0.53  CALCIUM 8.6* 9.0   CA 19-9 on 07/12/2019: 2791 No results found for: CEA1  Studies/Results: IR IMAGING GUIDED PORT INSERTION  Result Date: 07/14/2019 CLINICAL DATA:  Pancreatic carcinoma, needs poor for planned chemotherapy regimen EXAM: TUNNELED PORT CATHETER PLACEMENT WITH ULTRASOUND AND FLUOROSCOPIC GUIDANCE FLUOROSCOPY TIME:  0.6 minute; 52  uGym2 DAP ANESTHESIA/SEDATION: Intravenous Fentanyl 74mcg and Versed 2mg  were administered as conscious sedation during continuous monitoring of the patient's level of consciousness and physiological / cardiorespiratory status by the radiology RN, with a total moderate sedation time of 21 minutes. TECHNIQUE: The procedure, risks, benefits, and alternatives were explained to the patient. Questions regarding the procedure were encouraged and answered. The patient understands and consents to the procedure. As antibiotic prophylaxis, cefazolin 2 g was ordered pre-procedure and administered intravenously within one hour of incision. Patency of the right IJ  vein was confirmed with ultrasound with image documentation. An appropriate skin site was determined. Skin site was marked. Region was prepped using maximum barrier technique including cap and mask, sterile gown, sterile gloves, large sterile sheet, and Chlorhexidine as cutaneous antisepsis. The region was infiltrated locally with 1% lidocaine. Under real-time ultrasound guidance, the right IJ vein was accessed with a 21 gauge micropuncture needle; the needle tip within the vein was confirmed with ultrasound image documentation. Needle was exchanged over a 018 guidewire for transitional dilator, and vascular measurement was performed. A small incision was made on the right anterior chest wall and a subcutaneous pocket fashioned. The power-injectable port was positioned and its catheter tunneled to the right IJ dermatotomy site. The transitional dilator was exchanged over an Amplatz wire for a peel-away sheath, through which the port catheter, which had been trimmed to the appropriate length, was advanced and positioned under fluoroscopy with its tip at the cavoatrial junction. Spot chest radiograph confirms good catheter position and no pneumothorax. The port was flushed per protocol. The pocket was closed with deep interrupted and subcuticular continuous 3-0 Monocryl sutures. The incisions were covered with Dermabond then covered with a sterile dressing. The patient tolerated the procedure well. COMPLICATIONS: COMPLICATIONS None immediate IMPRESSION: Technically successful right IJ power-injectable port catheter placement. Ready for routine use. Electronically Signed   By: Lucrezia Europe M.D.   On: 07/14/2019 13:39    Medications: I have reviewed the patient's current medications.  Assessment/Plan: 1.Metastatic pancreatic adenocarcinoma             -CT of the abdomen pelvis on 07/02/2019  showed a 3.4 x 2.2 x 3              cm mass in the head of the pancreas with multiple ill-defined              hypodense  liver masses.             -CT of the chest on 07/07/2019 showed no evidence of thoracic              metastasis.             -Ultrasound guided liver biopsy on 07/07/2019 demonstrated              metastatic pancreatic adenocarcinoma.  -Elevated CA 19-9 2.  Abdominal pain, nausea, vomiting secondary to pancreatic adenocarcinoma 3.  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  Chronicity, now with mild microcytic anemia 4.  Hypokalemia 5.  Uterine fibroids 6.  Abdominal pain secondary to #1 7.  Nausea  Rachel Vasquez has metastatic pancreas cancer.  Her pain is under better control and her overall performance status has improved over the past few days.  The plan is to schedule an outpatient office visit and chemotherapy for 07/21/2019.  I entered a chemotherapy plan today.  She will attend a chemotherapy teaching class on 07/21/2019.  Recommendations: 1.  Continue current dose of OxyContin and oxycodone. 2.  Continue metoclopramide for nausea 3.  Continue prednisone as an appetite stimulant, will decrease dose to 10 mg daily 4.  Discharge to home when she can ambulate is taking a diet.     LOS: 13 days   Betsy Coder, MD   07/16/2019, 11:15 AM

## 2019-07-16 NOTE — Progress Notes (Signed)
PT Cancellation Note  Patient Details Name: Rachel Vasquez MRN: [id number] DOB: Jan 21, 1956   Cancelled Treatment:    Reason Eval/Treat Not Completed: Other (comment). Pt working with nursing.  Pt requested PT to come back later in the AM. She worked earlier with OT. Will check back as schedule permits.   Galen Manila 07/16/2019, 10:17 AM

## 2019-07-17 LAB — MAGNESIUM: Magnesium: 1.8 mg/dL (ref 1.7–2.4)

## 2019-07-17 MED ORDER — HEPARIN SOD (PORK) LOCK FLUSH 100 UNIT/ML IV SOLN
500.0000 [IU] | INTRAVENOUS | Status: AC | PRN
  Administered 2019-07-17: 500 [IU]
  Filled 2019-07-17: qty 5

## 2019-07-17 MED ORDER — ONDANSETRON 4 MG PO TBDP: 4.0000 mg | ORAL_TABLET | Freq: Three times a day (TID) | ORAL | 0 refills | Status: AC | PRN

## 2019-07-17 MED ORDER — METOCLOPRAMIDE HCL 5 MG PO TABS: 5.0000 mg | ORAL_TABLET | Freq: Three times a day (TID) | ORAL | 0 refills | Status: DC

## 2019-07-17 MED ORDER — LORAZEPAM 0.5 MG PO TABS: 0.5000 mg | ORAL_TABLET | Freq: Four times a day (QID) | ORAL | 0 refills | Status: DC | PRN

## 2019-07-17 MED ORDER — AMLODIPINE BESYLATE 10 MG PO TABS: 10.0000 mg | ORAL_TABLET | Freq: Every day | ORAL | 1 refills | Status: DC

## 2019-07-17 MED ORDER — POLYETHYLENE GLYCOL 3350 17 G PO PACK: 17.0000 g | PACK | Freq: Every day | ORAL | 0 refills | Status: DC | PRN

## 2019-07-17 MED ORDER — OXYCODONE HCL ER 20 MG PO T12A: 20.0000 mg | EXTENDED_RELEASE_TABLET | Freq: Every morning | ORAL | 0 refills | Status: DC

## 2019-07-17 MED ORDER — PANTOPRAZOLE SODIUM 40 MG PO TBEC: 40.0000 mg | DELAYED_RELEASE_TABLET | Freq: Every day | ORAL | 0 refills | Status: DC

## 2019-07-17 MED ORDER — POTASSIUM CHLORIDE CRYS ER 20 MEQ PO TBCR: 20.0000 meq | EXTENDED_RELEASE_TABLET | Freq: Every day | ORAL | 0 refills | Status: DC

## 2019-07-17 MED ORDER — PREDNISONE 10 MG PO TABS: 10.0000 mg | ORAL_TABLET | Freq: Every day | ORAL | 0 refills | Status: DC

## 2019-07-17 MED ORDER — OXYCODONE HCL 5 MG PO TABS: 5.0000 mg | ORAL_TABLET | ORAL | 0 refills | Status: DC | PRN

## 2019-07-17 MED ORDER — OXYCODONE HCL ER 10 MG PO T12A: 10.0000 mg | EXTENDED_RELEASE_TABLET | Freq: Every day | ORAL | 0 refills | Status: DC

## 2019-07-17 NOTE — Progress Notes (Signed)
Power port pamphlet and information card left.  Sister contacted and said she would call when she comes to get it.  Rachel Vasquez [phone]

## 2019-07-17 NOTE — Discharge Summary (Signed)
Physician Discharge Summary  Rachel Vasquez [id number] DOB: Nov 15, 1955 DOA: 07/02/2019  PCP: Rachel Vasquez  Admit date: 07/02/2019 Discharge date: 07/17/2019  Admitted From: home Disposition:  home  Recommendations for Outpatient Follow-up:  1. Follow up with PCP in 1-2 weeks and with oncology for chemo on 1/18 2. Please obtain BMP/CBC in one week 3. Please follow up on the following pending results:  Home Health:none  Equipment/Devices: none  Discharge Condition: Stable Code Status: full Diet recommendation: Heart Healthy  Brief/Interim Summary: 64 year old female presents for evaluation of progressive nausea, vomiting, loss of appetite, fatigue, and epigastric discomfort.  The patient reports that the symptoms developed insidiously little over 3 months ago, have now progressed to the point where she has now been able to tolerate much food or drink at all due to nausea and vomiting, and has developed some epigastric pain as well.  There has not been any fevers, chills, or cough associated with this.  She was evaluated at an urgent care recently, has been taking omeprazole without any relief, and was then seen by gastroenterology today who provided her with Zofran and planned for upcoming abdominal ultrasound and EGD.  Patient has been unable to tolerate the oral Zofran due to ongoing nausea and vomiting, does not feel that she can control her symptoms at home, and comes in for evaluation.  ED Course: Upon arrival to the ED, patient is found to be afebrile, saturating well on room air, and with stable blood pressure.  EKG features a sinus rhythm.  Chemistry panel and CBC are largely unremarkable, troponin is normal, and lipase elevated to 92.  Urinalysis notable for ketonuria and elevated specific gravity.  CT the abdomen and pelvis is concerning for generalized pancreatic inflammation with ill-defined hypodense suspicious mass at the head of the pancreas and multiple ill-defined  hypodense liver lesions.  Patient was given a liter of normal saline, morphine, and Zofran in the emergency department.  COVID-19 screening test is in process.  Gastroenterology was contacted by the ED physician and indicated that they would be available for consultation should the patient require admission for symptom control. with history of high blood pressure presenting with nausea vomiting and weight loss. She is found to have metastatic pancreatic cancer. Chemotherapy was started. She had had issues with uncontrollable nausea/vomiting and pain throughout her stay. At this time patient nausea is much improved tolerating diet. Patient is requesting to be discharged home today- reports her sister has arrived from [place] to help.  Assessment & Plan/Discharge Diagnoses: : issues addressed Mass of pancreas- adenocarcinoma: Continue diet as tolerated she will follow up with oncology on 118 for palliative chemo.  She will continue oxycodone OxyContin and bowel regimen nausea medication and steroids. Vasquez [facility] on 1/13:"[name] has metastatic pancreas cancer.The plan is to schedule an outpatient office visit and chemotherapy for 07/21/2019.  I entered a chemotherapy plan today.  She will attend a chemotherapy teaching class on 07/21/2019".  Nausea &vomiting - better- ate some.Cont Zofran Verdis Prime. On prednisone low-dose. Essential Hypertension: Continue amlodipine 10 mg daily Liver lesion- metastasis/Abdominal pain, epigastric Microcytic anemiaStable HypokalemiaReplaced Hypomagnesemia-stable nutrition Problem: Inadequate oral intake/etiology: nausea, vomiting, at times declining meds and treatment   DVT prophylaxis: Lovenox Code Status: Full Family Communication: patient and sister Disposition Plan: home today.  Patient is requesting for discharge home today.  Consultants:   Oncology  IR  Procedures:   Port placed  Subjective: Resting well, ate  some breakfast, did not want to work  with PT but reports has been ambulatory in room, wants to go home, sister has come from [place] to live with her and help her out.   Discharge Exam: Vitals:   07/17/19 0618 07/17/19 1217  BP: (!) 144/91 132/70  Pulse: (!) 109 95  Resp: 18 20  Temp: 98 F (36.7 C) 98.1 F (36.7 C)  SpO2: 96% 93%   General: Pt is alert, awake, not in acute distress Cardiovascular: RRR, S1/S2 +, no rubs, no gallops Respiratory: CTA bilaterally, no wheezing, no rhonchi Abdominal: Soft, NT, ND, bowel sounds + Extremities: no edema, no cyanosis  Discharge Instructions  Discharge Instructions    Diet - low sodium heart healthy   Complete by: As directed    Discharge instructions   Complete by: As directed    Please call call MD or return to ER for similar or worsening recurring problem that brought you to hospital or if any fever,nausea/vomiting,abdominal pain, uncontrolled pain, chest pain,  shortness of breath or any other alarming symptoms.  Please follow-up your doctor as instructed in a week time and call the office for appointment.  Please follow with Dr Learta Codding on 1/8 to discuss about further treatment  Please avoid alcohol, smoking, or any other illicit substance and maintain healthy habits including taking your regular medications as prescribed.  You were cared for by a hospitalist during your hospital stay. If you have any questions about your discharge medications or the care you received while you were in the hospital after you are discharged, you can call the unit and ask to speak with the hospitalist on call if the hospitalist that took care of you is not available.  Once you are discharged, your primary care physician will handle any further medical issues. Please note that NO REFILLS for any discharge medications will be authorized once you are discharged, as it is imperative that you return to your primary care physician (or establish a relationship with a  primary care physician if you do not have one) for your aftercare needs so that they can reassess your need for medications and monitor your lab values   Increase activity slowly   Complete by: As directed      Allergies as of 07/17/2019   No Known Allergies     Medication List    TAKE these medications   amLODipine 10 MG tablet Commonly known as: NORVASC Take 1 tablet (10 mg total) by mouth daily. Start taking on: July 18, 2019   lidocaine-prilocaine cream Commonly known as: EMLA Apply 1 application topically as needed. Apply 1 hour prior to stick and cover with plastic wrap   LORazepam 0.5 MG tablet Commonly known as: ATIVAN Take 1 tablet (0.5 mg total) by mouth every 6 (six) hours as needed for up to 15 doses (nausea/vomiting. May give PO or SL.).   metoCLOPramide 5 MG tablet Commonly known as: REGLAN Take 1 tablet (5 mg total) by mouth 3 (three) times daily before meals.   multivitamin with minerals tablet Take 1 tablet by mouth daily.   ondansetron 4 MG disintegrating tablet Commonly known as: ZOFRAN-ODT Take 1 tablet (4 mg total) by mouth every 8 (eight) hours as needed for up to 30 doses for nausea or vomiting.   oxyCODONE 10 mg 12 hr tablet Commonly known as: OXYCONTIN Take 1 tablet (10 mg total) by mouth at bedtime for 7 doses.   oxyCODONE 5 MG immediate release tablet Commonly known as: Oxy IR/ROXICODONE Take 1 tablet (5 mg total)  by mouth every 4 (four) hours as needed for up to 15 doses for moderate pain.   oxyCODONE 20 mg 12 hr tablet Commonly known as: OXYCONTIN Take 1 tablet (20 mg total) by mouth every morning for 7 doses. Start taking on: July 18, 2019   pantoprazole 40 MG tablet Commonly known as: PROTONIX Take 1 tablet (40 mg total) by mouth daily.   polyethylene glycol 17 g packet Commonly known as: MIRALAX / GLYCOLAX Take 17 g by mouth daily as needed for mild constipation.   potassium chloride SA 20 MEQ tablet Commonly known as:  KLOR-CON Take 1 tablet (20 mEq total) by mouth daily for 15 days.   predniSONE 10 MG tablet Commonly known as: DELTASONE Take 1 tablet (10 mg total) by mouth daily with breakfast. Start taking on: July 18, 2019   prochlorperazine 10 MG tablet Commonly known as: COMPAZINE Take 1 tablet (10 mg total) by mouth every 6 (six) hours as needed for nausea.            Durable Medical Equipment  (From admission, onward)         Start     Ordered   07/14/19 1622  For home use only DME 3 n 1  Once     07/14/19 1621   07/14/19 1621  For home use only DME Walker rolling  Once    Question Answer Comment  Walker: With 5 Inch Wheels   Patient needs a walker to treat with the following condition Weakness      07/14/19 1621         Follow-up Information    Ladell Pier, MD. Call on 07/21/2019.   Specialty: Oncology Why: follow on 1/18 Contact information: [place] [address] [phone]          No Known Allergies  The results of significant diagnostics from this hospitalization (including imaging, microbiology, ancillary and laboratory) are listed below for reference.    Microbiology: Recent Results (from the past 240 hour(s))  Surgical pcr screen     Status: None   Collection Time: 07/13/19  9:16 PM   Specimen: Nasal Mucosa; Nasal Swab  Result Value Ref Range Status   MRSA, PCR NEGATIVE NEGATIVE Final   Staphylococcus aureus NEGATIVE NEGATIVE Final    Comment: (NOTE) The Xpert SA Assay (FDA approved for NASAL specimens in patients 24 years of age and older), is one component of a comprehensive surveillance program. It is not intended to diagnose infection nor to guide or monitor treatment. Performed at [facility], [place] [address], [place], [place] [address]     Procedures/Studies: CT CHEST W CONTRAST  Result Date: 07/07/2019 CLINICAL DATA:  Liver mass.  Concern for hepatic metastasis EXAM: CT CHEST WITH CONTRAST TECHNIQUE:  Multidetector CT imaging of the chest was performed during intravenous contrast administration. CONTRAST:  32mL OMNIPAQUE IOHEXOL 300 MG/ML  SOLN COMPARISON:  CT 07/02/2019 FINDINGS: Cardiovascular: No significant vascular findings. Normal heart size. No pericardial effusion. Mediastinum/Nodes: No axillary supraclavicular adenopathy. Multiple nodules within the thyroid gland measures 12 mm or less. This is not meet the size requirement for follow-up imaging. No mediastinal lymphadenopathy. Moderate to large hiatal hernia posterior to the LEFT heart. Lungs/Pleura: Linear atelectasis in the lingula and medial RIGHT middle lobe. No suspicious nodularity. Upper Abdomen: Several low-density suspicious lesions in the liver again demonstrated. Inflammation potential mass in the pancreatic head again noted. No interval change from 07/02/2019. Musculoskeletal: No aggressive osseous lesion. IMPRESSION:  1. No evidence of thoracic metastasis. 2. Low-density lesions in the liver concerning for metastasis. See recent abdomen CT 07/02/2019. 3. Inflammation head of pancreas with concern for underlying neoplasm. Recommend appropriate tumor biomarkers. Electronically Signed   By: Suzy Bouchard M.D.   On: 07/07/2019 08:23   CT Abdomen Pelvis W Contrast  Result Date: 07/02/2019 CLINICAL DATA:  Abdominal pain with nausea and vomiting EXAM: CT ABDOMEN AND PELVIS WITH CONTRAST TECHNIQUE: Multidetector CT imaging of the abdomen and pelvis was performed using the standard protocol following bolus administration of intravenous contrast. CONTRAST:  133mL OMNIPAQUE IOHEXOL 300 MG/ML  SOLN COMPARISON:  CT 04/26/2004 FINDINGS: Lower chest: Lung bases demonstrate subsegmental atelectasis at the lingula, right middle lobe and inferior right upper lobe. No acute consolidation or pleural effusion. The heart size is within normal limits. Irregular moderate hiatal hernia with wall thickening. This is increased compared to prior. Hepatobiliary:  Scattered hypodense liver lesions, some of which were present on comparison study. Multiple new ill-defined hypodense liver masses concerning for metastatic disease. Largest suspicious lesion is seen in the right hepatic dome and measures 2.5 cm. Multiple calcified gallstones. No biliary dilatation. Pancreas: Generalized inflammatory changes about the pancreas. Development of mild ductal dilatation. Ill-defined hypodense mass at the head of the pancreas measuring approximately 3.4 cm transverse by 2.2 cm AP by 3 cm craniocaudad. Spleen: Normal in size without focal abnormality. Adrenals/Urinary Tract: Adrenal glands are unremarkable. Kidneys are normal, without renal calculi, focal lesion, or hydronephrosis. Bladder is unremarkable. Stomach/Bowel: No dilated small bowel. Negative for colon wall thickening. Negative appendix. Vascular/Lymphatic: Nonaneurysmal aorta. Hazy appearance of mesenteric root with subcentimeter nodes. Multiple small nodes or punctate nodules in the right lower quadrant/right gutter. Hypoenhancement of the distal splenic vein and superior mesenteric vein. Reproductive: Markedly enlarged uterus with multiple masses, increased compared to prior. Other: Negative for free air or significant ascites. Anterior abdominal wall laxity. Small supraumbilical fat containing ventral hernia Musculoskeletal: No acute or suspicious osseous abnormality. IMPRESSION: 1. Generalized pancreatic inflammation. There is an ill-defined hypodense suspicious mass at the head of the pancreas measuring 3.4 x 2.2 x 3 cm. 2. Multiple ill-defined hypodense liver masses, consistent with metastatic disease. 3. Hypoenhancement of the splenic vein and superior mesenteric veins near their confluence, potentially occluded. 4. Multiple punctate nodules or small nodes in the right lower quadrant/gutter, cannot exclude metastatic disease 5. Moderate irregular hiatal hernia with some wall thickening, could be correlated with endoscopy  6. Bulky enlargement of the uterus with multiple large masses, increased compared to prior. Electronically Signed   By: Donavan Foil M.D.   On: 07/02/2019 22:17   US BIOPSY (LIVER)  Result Date: 07/07/2019 INDICATION: Concern for metastatic pancreatic cancer. Please perform ultrasound-guided liver lesion biopsy for tissue diagnostic purposes. EXAM: ULTRASOUND GUIDED LIVER LESION BIOPSY COMPARISON:  CT abdomen and pelvis-07/02/2019 MEDICATIONS: None ANESTHESIA/SEDATION: Fentanyl 100 mcg IV; Versed 2 mg IV Total Moderate Sedation time:  17 Minutes. The patient's level of consciousness and vital signs were monitored continuously by radiology nursing throughout the procedure under my direct supervision. COMPLICATIONS: None immediate. PROCEDURE: Informed written consent was obtained from the patient after a discussion of the risks, benefits and alternatives to treatment. The patient understands and consents the procedure. A timeout was performed prior to the initiation of the procedure. Ultrasound scanning was performed of the right upper abdominal quadrant demonstrates an ill-defined approximately 2.9 x 3.1 x 2.2 cm hypoechoic nodule/mass within the dome of the right lobe of the liver correlating with the  indeterminate mass seen on preceding abdominal CT image 15, series 3. Note, imaging was degraded secondary to patient body habitus well as well as the lesion being located within the dome of the liver with associated interposition of lung. The procedure was planned. The right upper abdominal quadrant was prepped and draped in the usual sterile fashion. The overlying soft tissues were anesthetized with 1% lidocaine with epinephrine. A 17 gauge, 6.8 cm co-axial needle was advanced into a peripheral aspect of the lesion. This was followed by 6 core biopsies with an 18 gauge core device under direct ultrasound guidance. The coaxial needle tract was embolized with a small amount of Gel-Foam slurry and superficial  hemostasis was obtained with manual compression. Post procedural scanning was negative for definitive area of hemorrhage or additional complication. A dressing was placed. The patient tolerated the procedure well without immediate post procedural complication. IMPRESSION: Technically challenging though ultimately successful ultrasound guided core needle biopsy of indeterminate mass within the dome of the right lobe of the liver. Electronically Signed   By: Sandi Mariscal M.D.   On: 07/07/2019 13:34   DG Abd 2 Views  Result Date: 07/08/2019 CLINICAL DATA:  Abdominal pain and distension x3 days. EXAM: ABDOMEN - 2 VIEW COMPARISON:  None. FINDINGS: The bowel gas pattern is normal. A moderate to marked amount of stool is seen within the ascending colon. There is no evidence of free air. No radio-opaque calculi or other significant radiographic abnormality is seen. A radiopaque tubal ligation clip is seen within the pelvis on the right. An additional tubal ligation clip is seen overlying the superior aspect of the sacrum on the left. A 4 mm focal opacity is seen overlying the symphysis pubis on the left. IMPRESSION: 1. Normal bowel gas pattern. No evidence of free air. Electronically Signed   By: Virgina Norfolk M.D.   On: 07/08/2019 16:27   DG Abd Portable 1V  Result Date: 07/10/2019 CLINICAL DATA:  Abdominal pain EXAM: PORTABLE ABDOMEN - 1 VIEW COMPARISON:  07/08/2019 FINDINGS: Bowel gas pattern remains unremarkable. Stool burden is overall mild with greater burden within the ascending colon. Tubal ligation clips noted. IMPRESSION: Normal bowel gas pattern. Electronically Signed   By: Macy Mis M.D.   On: 07/10/2019 13:20   IR IMAGING GUIDED PORT INSERTION  Result Date: 07/14/2019 CLINICAL DATA:  Pancreatic carcinoma, needs poor for planned chemotherapy regimen EXAM: TUNNELED PORT CATHETER PLACEMENT WITH ULTRASOUND AND FLUOROSCOPIC GUIDANCE FLUOROSCOPY TIME:  0.6 minute; 52  uGym2 DAP ANESTHESIA/SEDATION:  Intravenous Fentanyl 83mcg and Versed 2mg  were administered as conscious sedation during continuous monitoring of the patient's level of consciousness and physiological / cardiorespiratory status by the radiology RN, with a total moderate sedation time of 21 minutes. TECHNIQUE: The procedure, risks, benefits, and alternatives were explained to the patient. Questions regarding the procedure were encouraged and answered. The patient understands and consents to the procedure. As antibiotic prophylaxis, cefazolin 2 g was ordered pre-procedure and administered intravenously within one hour of incision. Patency of the right IJ vein was confirmed with ultrasound with image documentation. An appropriate skin site was determined. Skin site was marked. Region was prepped using maximum barrier technique including cap and mask, sterile gown, sterile gloves, large sterile sheet, and Chlorhexidine as cutaneous antisepsis. The region was infiltrated locally with 1% lidocaine. Under real-time ultrasound guidance, the right IJ vein was accessed with a 21 gauge micropuncture needle; the needle tip within the vein was confirmed with ultrasound image documentation. Needle was exchanged over a  018 guidewire for transitional dilator, and vascular measurement was performed. A small incision was made on the right anterior chest wall and a subcutaneous pocket fashioned. The power-injectable port was positioned and its catheter tunneled to the right IJ dermatotomy site. The transitional dilator was exchanged over an Amplatz wire for a peel-away sheath, through which the port catheter, which had been trimmed to the appropriate length, was advanced and positioned under fluoroscopy with its tip at the cavoatrial junction. Spot chest radiograph confirms good catheter position and no pneumothorax. The port was flushed Vasquez protocol. The pocket was closed with deep interrupted and subcuticular continuous 3-0 Monocryl sutures. The incisions were  covered with Dermabond then covered with a sterile dressing. The patient tolerated the procedure well. COMPLICATIONS: COMPLICATIONS None immediate IMPRESSION: Technically successful right IJ power-injectable port catheter placement. Ready for routine use. Electronically Signed   By: Lucrezia Europe M.D.   On: 07/14/2019 13:39    Labs: BNP (last 3 results) No results for input(s): BNP in the last 8760 hours. Basic Metabolic Panel: Recent Labs  Lab 07/11/19 0550 07/12/19 0825 07/13/19 0342 07/13/19 0342 07/14/19 0621 07/14/19 1239 07/15/19 0749 07/15/19 1948 07/16/19 0308 07/17/19 0232  NA 133*   < > 135   < > 136 134* 138 134* 136  --   K 3.0*   < > 3.3*   < > 3.1* 2.9* 3.1* 3.5 3.9  --   CL 94*   < > 99   < > 95* 94* 99 98 101  --   CO2 25   < > 24   < > 29 26 30 27 26   --   GLUCOSE 129*   < > 136*   < > 168* 194* 163* 172* 137*  --   BUN <5*   < > 6*   < > 7* 7* 5* 6* 6*  --   CREATININE 0.63   < > 0.59   < > 0.50 0.60 0.52 0.51 0.53  --   CALCIUM 8.8*   < > 8.9   < > 9.1 9.1 9.0 8.6* 9.0  --   MG 1.9   < > 1.8  --  1.9  --  1.8  --  1.8 1.8  PHOS 3.0  --   --   --   --   --   --   --   --   --    < > = values in this interval not displayed.   Liver Function Tests: Recent Labs  Lab 07/16/19 1133  AST 222*  ALT 337*  ALKPHOS 496*  BILITOT 17.8*  PROT 6.6  ALBUMIN 2.3*   No results for input(s): LIPASE, AMYLASE in the last 168 hours. No results for input(s): AMMONIA in the last 168 hours. CBC: Recent Labs  Lab 07/12/19 0825 07/13/19 0342 07/14/19 0621 07/15/19 0749 07/16/19 0308  WBC 8.9 9.3 10.1 9.1 10.4  NEUTROABS  --   --  7.8*  --   --   HGB 11.4* 11.3* 11.7* 10.8* 10.5*  HCT 35.3* 34.3* 35.3* 32.0* 32.1*  MCV 71.3* 70.7* 69.4* 68.5* 69.8*  PLT 213 213 183 154 158   Cardiac Enzymes: No results for input(s): CKTOTAL, CKMB, CKMBINDEX, TROPONINI in the last 168 hours. BNP: Invalid input(s): POCBNP CBG: No results for input(s): GLUCAP in the last 168  hours. D-Dimer No results for input(s): DDIMER in the last 72 hours. Hgb A1c No results for input(s): HGBA1C in the last 72 hours. Lipid  Profile No results for input(s): CHOL, HDL, LDLCALC, TRIG, CHOLHDL, LDLDIRECT in the last 72 hours. Thyroid function studies No results for input(s): TSH, T4TOTAL, T3FREE, THYROIDAB in the last 72 hours.  Invalid input(s): FREET3 Anemia work up No results for input(s): VITAMINB12, FOLATE, FERRITIN, TIBC, IRON, RETICCTPCT in the last 72 hours. Urinalysis    Component Value Date/Time   COLORURINE AMBER (A) 07/04/2019 0646   APPEARANCEUR CLEAR 07/04/2019 0646   LABSPEC 1.026 07/04/2019 0646   PHURINE 5.0 07/04/2019 0646   GLUCOSEU NEGATIVE 07/04/2019 0646   HGBUR MODERATE (A) 07/04/2019 0646   BILIRUBINUR SMALL (A) 07/04/2019 0646   KETONESUR 20 (A) 07/04/2019 0646   PROTEINUR 30 (A) 07/04/2019 0646   NITRITE NEGATIVE 07/04/2019 0646   LEUKOCYTESUR NEGATIVE 07/04/2019 0646   Sepsis Labs Invalid input(s): PROCALCITONIN,  WBC,  LACTICIDVEN Microbiology Recent Results (from the past 240 hour(s))  Surgical pcr screen     Status: None   Collection Time: 07/13/19  9:16 PM   Specimen: Nasal Mucosa; Nasal Swab  Result Value Ref Range Status   MRSA, PCR NEGATIVE NEGATIVE Final   Staphylococcus aureus NEGATIVE NEGATIVE Final    Comment: (NOTE) The Xpert SA Assay (FDA approved for NASAL specimens in patients 68 years of age and older), is one component of a comprehensive surveillance program. It is not intended to diagnose infection nor to guide or monitor treatment. Performed at [facility], [place] [address], [place], [place] [address]      Time coordinating discharge: 35  minutes  SIGNED: Antonieta Pert, MD  [facility] 07/17/2019, 4:36 PM  If 7PM-7AM, please contact night-coverage [email or web address]

## 2019-07-18 ENCOUNTER — Inpatient Hospital Stay: Payer: BC Managed Care – PPO | Attending: Nurse Practitioner

## 2019-07-18 ENCOUNTER — Other Ambulatory Visit: Payer: Self-pay

## 2019-07-18 ENCOUNTER — Encounter: Payer: Self-pay | Admitting: *Deleted

## 2019-07-18 DIAGNOSIS — K8689 Other specified diseases of pancreas: Secondary | ICD-10-CM

## 2019-07-18 NOTE — Research (Signed)
07/18/2019 [employer] 2018-01 Blood Sample collection to Evaluate Biomarkers in Subject with Untreated Solid Tumors Referral received from Dr. Benay Spice on Wednesday, January 13th during patient's hospitalization. Patient discharged home on 07/17/2019 with plans for in-person chemotherapy education class today at 4:00pm and treatment initiation on Monday 07/21/2019. Arrangements made with education nurse Royston Sinner for research nurse to meet with patient briefly upon arrival today to provide a copy of the informed consent document for patient's review. If patient wishes to proceed, research nurse will meet with patient Monday morning at 8am to complete the consent process prior to pre-treatment blood sample collection. Cindy S. Brigitte Pulse BSN, RN, CCRP 07/18/2019 1:12 PM  07/18/2019 Met with patient and sister x 10 minutes today to provide a brief overview of the research blood sample collection and health information collection for the [employer] 2018-01 study. Eligibility previously reviewed and confirmed by Otilio Miu RN, as long as no IV contrast administered or biopsies obtained between now and treatment start. Explained that 5 tubes of blood are collected preferable via peripheral stick, however, patient stated she did not want that. Offered possibility of sample collection via port and patient said she would consider it. Noted that once samples are collected, patient would be given a $50 [employer] gift card.  Patient given a copy of the consent form along with my business card so that she can think about this over the weekend. She understands that this is entirely voluntary, and that it is her decision about whether or not she would like to be in the study. Plans made to meet with patient at 8am on Monday if she would like to proceed, since samples must be collected prior to treatment start. She need not sign the consent form prior to that visit, rather we would review it together and have all of her  questions answered prior to signing. Patient was told she could call and leave a voice mail over the weekend at my direct number [phone] if she makes a decision one way or the other. Patient and her sister are in agreement with this plan. Thanked patient for her willingness to consider the study. Cindy S. Brigitte Pulse BSN, RN, CCRP 07/18/2019 4:18 PM

## 2019-07-19 ENCOUNTER — Other Ambulatory Visit: Payer: Self-pay | Admitting: Oncology

## 2019-07-21 ENCOUNTER — Inpatient Hospital Stay (HOSPITAL_BASED_OUTPATIENT_CLINIC_OR_DEPARTMENT_OTHER): Payer: BC Managed Care – PPO | Admitting: Nurse Practitioner

## 2019-07-21 ENCOUNTER — Inpatient Hospital Stay (HOSPITAL_COMMUNITY): Payer: BC Managed Care – PPO

## 2019-07-21 ENCOUNTER — Other Ambulatory Visit: Payer: Self-pay

## 2019-07-21 ENCOUNTER — Inpatient Hospital Stay: Payer: BC Managed Care – PPO

## 2019-07-21 ENCOUNTER — Encounter (HOSPITAL_COMMUNITY): Payer: Self-pay | Admitting: Oncology

## 2019-07-21 ENCOUNTER — Encounter: Payer: Self-pay | Admitting: Nurse Practitioner

## 2019-07-21 ENCOUNTER — Inpatient Hospital Stay (HOSPITAL_COMMUNITY)
Admission: AD | Admit: 2019-07-21 | Discharge: 2019-07-29 | DRG: 424 | Disposition: A | Discharge status: Home / Self Care | Payer: BC Managed Care – PPO | Source: Ambulatory Visit | Attending: Oncology | Admitting: Oncology

## 2019-07-21 ENCOUNTER — Encounter: Payer: Self-pay | Admitting: *Deleted

## 2019-07-21 VITALS — BP 121/85 | HR 102 | Temp 98.1°F | Resp 16 | Ht 67.0 in | Wt 238.2 lb

## 2019-07-21 DIAGNOSIS — M549 Dorsalgia, unspecified: Secondary | ICD-10-CM | POA: Diagnosis not present

## 2019-07-21 DIAGNOSIS — R718 Other abnormality of red blood cells: Secondary | ICD-10-CM | POA: Diagnosis not present

## 2019-07-21 DIAGNOSIS — K859 Acute pancreatitis without necrosis or infection, unspecified: Secondary | ICD-10-CM

## 2019-07-21 DIAGNOSIS — R17 Unspecified jaundice: Secondary | ICD-10-CM

## 2019-07-21 DIAGNOSIS — K219 Gastro-esophageal reflux disease without esophagitis: Secondary | ICD-10-CM | POA: Diagnosis present

## 2019-07-21 DIAGNOSIS — D696 Thrombocytopenia, unspecified: Secondary | ICD-10-CM | POA: Diagnosis present

## 2019-07-21 DIAGNOSIS — G893 Neoplasm related pain (acute) (chronic): Secondary | ICD-10-CM

## 2019-07-21 DIAGNOSIS — E876 Hypokalemia: Secondary | ICD-10-CM | POA: Diagnosis present

## 2019-07-21 DIAGNOSIS — N939 Abnormal uterine and vaginal bleeding, unspecified: Secondary | ICD-10-CM | POA: Diagnosis not present

## 2019-07-21 DIAGNOSIS — R112 Nausea with vomiting, unspecified: Secondary | ICD-10-CM | POA: Diagnosis not present

## 2019-07-21 DIAGNOSIS — C250 Malignant neoplasm of head of pancreas: Secondary | ICD-10-CM | POA: Diagnosis present

## 2019-07-21 DIAGNOSIS — Y831 Surgical operation with implant of artificial internal device as the cause of abnormal reaction of the patient, or of later complication, without mention of misadventure at the time of the procedure: Secondary | ICD-10-CM | POA: Diagnosis present

## 2019-07-21 DIAGNOSIS — C259 Malignant neoplasm of pancreas, unspecified: Secondary | ICD-10-CM | POA: Diagnosis not present

## 2019-07-21 DIAGNOSIS — R0600 Dyspnea, unspecified: Secondary | ICD-10-CM | POA: Diagnosis not present

## 2019-07-21 DIAGNOSIS — Z20822 Contact with and (suspected) exposure to covid-19: Secondary | ICD-10-CM | POA: Diagnosis present

## 2019-07-21 DIAGNOSIS — K8689 Other specified diseases of pancreas: Secondary | ICD-10-CM

## 2019-07-21 DIAGNOSIS — Z801 Family history of malignant neoplasm of trachea, bronchus and lung: Secondary | ICD-10-CM

## 2019-07-21 DIAGNOSIS — D509 Iron deficiency anemia, unspecified: Secondary | ICD-10-CM | POA: Diagnosis present

## 2019-07-21 DIAGNOSIS — R000 Tachycardia, unspecified: Secondary | ICD-10-CM | POA: Diagnosis not present

## 2019-07-21 DIAGNOSIS — I82890 Acute embolism and thrombosis of other specified veins: Secondary | ICD-10-CM | POA: Diagnosis not present

## 2019-07-21 DIAGNOSIS — R5383 Other fatigue: Secondary | ICD-10-CM | POA: Diagnosis not present

## 2019-07-21 DIAGNOSIS — R627 Adult failure to thrive: Secondary | ICD-10-CM | POA: Diagnosis not present

## 2019-07-21 DIAGNOSIS — R197 Diarrhea, unspecified: Secondary | ICD-10-CM | POA: Diagnosis present

## 2019-07-21 DIAGNOSIS — Z95828 Presence of other vascular implants and grafts: Secondary | ICD-10-CM

## 2019-07-21 DIAGNOSIS — R509 Fever, unspecified: Secondary | ICD-10-CM | POA: Diagnosis not present

## 2019-07-21 DIAGNOSIS — Z7952 Long term (current) use of systemic steroids: Secondary | ICD-10-CM | POA: Diagnosis not present

## 2019-07-21 DIAGNOSIS — R739 Hyperglycemia, unspecified: Secondary | ICD-10-CM | POA: Diagnosis not present

## 2019-07-21 DIAGNOSIS — C787 Secondary malignant neoplasm of liver and intrahepatic bile duct: Secondary | ICD-10-CM | POA: Diagnosis present

## 2019-07-21 DIAGNOSIS — C801 Malignant (primary) neoplasm, unspecified: Secondary | ICD-10-CM | POA: Diagnosis not present

## 2019-07-21 DIAGNOSIS — D259 Leiomyoma of uterus, unspecified: Secondary | ICD-10-CM | POA: Diagnosis present

## 2019-07-21 DIAGNOSIS — R600 Localized edema: Secondary | ICD-10-CM | POA: Diagnosis present

## 2019-07-21 DIAGNOSIS — T82524A Displacement of infusion catheter, initial encounter: Secondary | ICD-10-CM | POA: Diagnosis present

## 2019-07-21 DIAGNOSIS — D61818 Other pancytopenia: Secondary | ICD-10-CM | POA: Diagnosis not present

## 2019-07-21 DIAGNOSIS — K449 Diaphragmatic hernia without obstruction or gangrene: Secondary | ICD-10-CM | POA: Diagnosis present

## 2019-07-21 DIAGNOSIS — Z800 Family history of malignant neoplasm of digestive organs: Secondary | ICD-10-CM | POA: Diagnosis not present

## 2019-07-21 DIAGNOSIS — R4182 Altered mental status, unspecified: Secondary | ICD-10-CM | POA: Diagnosis not present

## 2019-07-21 DIAGNOSIS — R0682 Tachypnea, not elsewhere classified: Secondary | ICD-10-CM | POA: Diagnosis not present

## 2019-07-21 DIAGNOSIS — K55059 Acute (reversible) ischemia of intestine, part and extent unspecified: Secondary | ICD-10-CM | POA: Diagnosis not present

## 2019-07-21 DIAGNOSIS — A419 Sepsis, unspecified organism: Secondary | ICD-10-CM | POA: Diagnosis not present

## 2019-07-21 DIAGNOSIS — E8809 Other disorders of plasma-protein metabolism, not elsewhere classified: Secondary | ICD-10-CM | POA: Diagnosis present

## 2019-07-21 DIAGNOSIS — Z79899 Other long term (current) drug therapy: Secondary | ICD-10-CM | POA: Diagnosis not present

## 2019-07-21 DIAGNOSIS — K831 Obstruction of bile duct: Principal | ICD-10-CM | POA: Diagnosis present

## 2019-07-21 DIAGNOSIS — D72829 Elevated white blood cell count, unspecified: Secondary | ICD-10-CM | POA: Diagnosis not present

## 2019-07-21 DIAGNOSIS — M7989 Other specified soft tissue disorders: Secondary | ICD-10-CM

## 2019-07-21 LAB — CBC WITH DIFFERENTIAL (CANCER CENTER ONLY)
Abs Immature Granulocytes: 0.28 10*3/uL — ABNORMAL HIGH (ref 0.00–0.07)
Basophils Absolute: 0 10*3/uL (ref 0.0–0.1)
Basophils Relative: 0 %
Eosinophils Absolute: 0.1 10*3/uL (ref 0.0–0.5)
Eosinophils Relative: 0 %
HCT: 29.4 % — ABNORMAL LOW (ref 36.0–46.0)
Hemoglobin: 10.2 g/dL — ABNORMAL LOW (ref 12.0–15.0)
Immature Granulocytes: 2 %
Lymphocytes Relative: 6 %
Lymphs Abs: 1 10*3/uL (ref 0.7–4.0)
MCH: 23.8 pg — ABNORMAL LOW (ref 26.0–34.0)
MCHC: 34.7 g/dL (ref 30.0–36.0)
MCV: 68.5 fL — ABNORMAL LOW (ref 80.0–100.0)
Monocytes Absolute: 1.4 10*3/uL — ABNORMAL HIGH (ref 0.1–1.0)
Monocytes Relative: 8 %
Neutro Abs: 14.7 10*3/uL — ABNORMAL HIGH (ref 1.7–7.7)
Neutrophils Relative %: 84 %
Platelet Count: 205 10*3/uL (ref 150–400)
RBC: 4.29 MIL/uL (ref 3.87–5.11)
RDW: 21.6 % — ABNORMAL HIGH (ref 11.5–15.5)
WBC Count: 17.5 10*3/uL — ABNORMAL HIGH (ref 4.0–10.5)
nRBC: 0.6 % — ABNORMAL HIGH (ref 0.0–0.2)

## 2019-07-21 LAB — CMP (CANCER CENTER ONLY)
ALT: 250 U/L — ABNORMAL HIGH (ref 0–44)
AST: 155 U/L — ABNORMAL HIGH (ref 15–41)
Albumin: 2.3 g/dL — ABNORMAL LOW (ref 3.5–5.0)
Alkaline Phosphatase: 657 U/L — ABNORMAL HIGH (ref 38–126)
Anion gap: 11 (ref 5–15)
BUN: 9 mg/dL (ref 8–23)
CO2: 26 mmol/L (ref 22–32)
Calcium: 9.2 mg/dL (ref 8.9–10.3)
Chloride: 97 mmol/L — ABNORMAL LOW (ref 98–111)
Creatinine: 0.77 mg/dL (ref 0.44–1.00)
GFR, Est AFR Am: >60 mL/min (ref >60)
GFR, Estimated: >60 mL/min (ref >60)
Glucose, Bld: 199 mg/dL — ABNORMAL HIGH (ref 70–99)
Potassium: 3.3 mmol/L — ABNORMAL LOW (ref 3.5–5.1)
Sodium: 134 mmol/L — ABNORMAL LOW (ref 135–145)
Total Bilirubin: 25.9 mg/dL (ref 0.3–1.2)
Total Protein: 6.9 g/dL (ref 6.5–8.1)

## 2019-07-21 LAB — RESEARCH LABS

## 2019-07-21 LAB — SARS CORONAVIRUS 2 (TAT 6-24 HRS): SARS Coronavirus 2: NEGATIVE

## 2019-07-21 MED ORDER — OXYCODONE HCL ER 10 MG PO T12A
10.0000 mg | EXTENDED_RELEASE_TABLET | Freq: Every day | ORAL | Status: DC
  Administered 2019-07-22 – 2019-07-25 (×3): 10 mg via ORAL
  Filled 2019-07-21 (×8): qty 1

## 2019-07-21 MED ORDER — IOHEXOL 300 MG/ML  SOLN
100.0000 mL | Freq: Once | INTRAMUSCULAR | Status: AC | PRN
  Administered 2019-07-21: 21:00:00 100 mL via INTRAVENOUS

## 2019-07-21 MED ORDER — ENOXAPARIN SODIUM 40 MG/0.4ML ~~LOC~~ SOLN: 40.0000 mg | SUBCUTANEOUS | Status: DC

## 2019-07-21 MED ORDER — ONDANSETRON 4 MG PO TBDP
4.0000 mg | ORAL_TABLET | Freq: Three times a day (TID) | ORAL | Status: DC | PRN
  Administered 2019-07-27: 12:00:00 4 mg via ORAL
  Filled 2019-07-21: qty 1

## 2019-07-21 MED ORDER — METOCLOPRAMIDE HCL 5 MG PO TABS
5.0000 mg | ORAL_TABLET | Freq: Three times a day (TID) | ORAL | Status: DC
  Administered 2019-07-21 – 2019-07-26 (×11): 5 mg via ORAL
  Filled 2019-07-21 (×14): qty 1

## 2019-07-21 MED ORDER — LORAZEPAM 0.5 MG PO TABS: 0.5000 mg | ORAL_TABLET | Freq: Four times a day (QID) | ORAL | Status: DC | PRN

## 2019-07-21 MED ORDER — IOHEXOL 9 MG/ML PO SOLN: 500.0000 mL | ORAL | Status: AC

## 2019-07-21 MED ORDER — POLYETHYLENE GLYCOL 3350 17 G PO PACK: 17.0000 g | PACK | Freq: Every day | ORAL | Status: DC | PRN

## 2019-07-21 MED ORDER — PANTOPRAZOLE SODIUM 40 MG PO TBEC
40.0000 mg | DELAYED_RELEASE_TABLET | Freq: Every day | ORAL | Status: DC
  Administered 2019-07-22 – 2019-07-26 (×3): 40 mg via ORAL
  Filled 2019-07-21 (×6): qty 1

## 2019-07-21 MED ORDER — AMLODIPINE BESYLATE 10 MG PO TABS
10.0000 mg | ORAL_TABLET | Freq: Every day | ORAL | Status: DC
  Administered 2019-07-22 – 2019-07-29 (×6): 10 mg via ORAL
  Filled 2019-07-21 (×7): qty 1

## 2019-07-21 MED ORDER — SODIUM CHLORIDE 0.9% FLUSH
10.0000 mL | INTRAVENOUS | Status: DC | PRN
  Administered 2019-07-21: 10 mL via INTRAVENOUS
  Filled 2019-07-21: qty 10

## 2019-07-21 MED ORDER — ENOXAPARIN SODIUM 40 MG/0.4ML ~~LOC~~ SOLN
40.0000 mg | SUBCUTANEOUS | Status: DC
  Administered 2019-07-22: 40 mg via SUBCUTANEOUS
  Filled 2019-07-21: qty 0.4

## 2019-07-21 MED ORDER — IOHEXOL 9 MG/ML PO SOLN
ORAL | Status: AC
  Filled 2019-07-21: qty 1000

## 2019-07-21 MED ORDER — OXYCODONE HCL ER 20 MG PO T12A
20.0000 mg | EXTENDED_RELEASE_TABLET | Freq: Every morning | ORAL | Status: DC
  Administered 2019-07-24 – 2019-07-26 (×2): 20 mg via ORAL
  Filled 2019-07-21 (×6): qty 1

## 2019-07-21 MED ORDER — PREDNISONE 5 MG PO TABS
10.0000 mg | ORAL_TABLET | Freq: Every day | ORAL | Status: DC
  Administered 2019-07-23 – 2019-07-26 (×2): 10 mg via ORAL
  Filled 2019-07-21 (×6): qty 2

## 2019-07-21 MED ORDER — PROCHLORPERAZINE MALEATE 10 MG PO TABS: 10.0000 mg | ORAL_TABLET | Freq: Four times a day (QID) | ORAL | Status: DC | PRN

## 2019-07-21 MED ORDER — OXYCODONE HCL 5 MG PO TABS
5.0000 mg | ORAL_TABLET | ORAL | Status: DC | PRN
  Administered 2019-07-21 – 2019-07-28 (×7): 5 mg via ORAL
  Filled 2019-07-21 (×9): qty 1

## 2019-07-21 MED ORDER — POTASSIUM CHLORIDE IN NACL 20-0.9 MEQ/L-% IV SOLN
INTRAVENOUS | Status: DC
  Filled 2019-07-21 (×5): qty 1000

## 2019-07-21 NOTE — Research (Signed)
07/21/2019 [employer] 2018-01 Blood Sample collection to Evaluate Biomarkers in Subject with Untreated Solid Tumors Met with patient and sister upon arrival to clinic today. The consent and authorization form was reviewed with the patient in its entirety and all of the patient's questions were answered. Patient is aware that study procedures involve collection of blood sample today (5 tubes of blood for research purposes) as well as collection of demographic and medical information for the purposes of the study. Patient is aware that she will receive a $50 gift card today after blood sample tubes have been collected. The consent and authorization form was signed, dated and timed by the patient today.Thanked patient for her willingness to participate in this research study. A copy of the signed form was given to the patient by research assistant Remer Macho.  Patient has had no further biopsies or imaging since port placement one week ago on 07/14/2019. Patient meets all inclusion and none of the exclusion criteria to be enrolled on this study. Cindy S. Brigitte Pulse BSN, RN, [facility] 07/21/2019 8:38 AM

## 2019-07-21 NOTE — Progress Notes (Signed)
CRITICAL VALUE STICKER  CRITICAL VALUE: Bili 25.9  RECEIVER (on-site recipient of call): Dutch Gray  DATE & TIME NOTIFIED: 07/21/19 @ 1014 MESSENGER (representative from lab): Lurlean Horns, RN MD NOTIFIED: Dr. Benay Spice  TIME OF NOTIFICATION: 1016  RESPONSE: Seeing patient today.

## 2019-07-21 NOTE — Progress Notes (Signed)
Received Critical Lab - Total Bilirubin 25.9; verbally given to S. Coward, RN with read back @ 1010am.

## 2019-07-21 NOTE — Progress Notes (Signed)
  [facility] OFFICE PROGRESS NOTE   Diagnosis:  Pancreas cancer  INTERVAL HISTORY:   Rachel Vasquez returns as scheduled.  She complains of being "tired".  She had pain at the Port-A-Cath site this morning prior to being accessed.  She subsequently noticed a "burning" sensation at the left neck.  Symptoms are better now.  Nausea overall is better.  She did vomit yesterday.  She has intermittent "dry heaves".  She had a bowel movement yesterday and another today.  Abdomen feels "unsettled".  Pain is better.  She denies fever.  Objective:  Vital signs in last 24 hours:  Blood pressure 127/83, pulse (!) 109, temperature 98.3 F (36.8 C), temperature source Temporal, resp. rate 17, height 5\' 7"  (1.702 m), weight 238 lb 3.2 oz (108 kg), SpO2 97 %.    HEENT: Mild white coating over tongue.  No ulcers.  Scleral icterus. GI: Abdomen soft and nontender.  No hepatomegaly. Vascular: No leg edema. Neuro: Alert and oriented.  Skin: Jaundice. Port-A-Cath site is unremarkable.  No tenderness at the site of the infusion device or along the track.  Lab Results:  Lab Results  Component Value Date   WBC 17.5 (H) 07/21/2019   HGB 10.2 (L) 07/21/2019   HCT 29.4 (L) 07/21/2019   MCV 68.5 (L) 07/21/2019   PLT 205 07/21/2019   NEUTROABS 14.7 (H) 07/21/2019    Imaging:  No results found.  Medications: I have reviewed the patient's current medications.  Assessment/Plan: 1. Metastatic pancreatic adenocarcinoma -CT of the abdomen pelvis on 07/02/2019 showed a 3.4 x 2.2 x 3  cm mass in the head of the pancreas with multiple ill-defined  hypodense liver masses. -CT of the chest on 07/07/2019 showed no evidence of thoracic  metastasis. -Ultrasound guided liver biopsy on 07/07/2019 demonstrated  metastatic pancreatic adenocarcinoma.             -Elevated CA 19-9  -Markedly elevated bilirubin 07/21/2019 2.  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 Chronicity, now with mild microcytic anemia 4. Hypokalemia 5. Uterine fibroids 6.  Abdominal pain secondary to #1 7.  Nausea   Disposition: Rachel Vasquez is a 64 year old woman with recently diagnosed metastatic pancreas cancer.  She was scheduled to begin chemotherapy with gemcitabine/Abraxane today.  The bilirubin has returned markedly elevated.  We discussed the likelihood of biliary obstruction with her.  She understands she cannot receive chemotherapy until this is addressed.  See admission history and physical.    Ned Card ANP/GNP-BC   07/21/2019  9:30 AM

## 2019-07-21 NOTE — Progress Notes (Signed)
Was unable to get port accessed. Needle was in correct place but there was no blood return. Per patient access was very painful and burning in neck. Port was assessed by two nurses Liza and I and both felt uncomfortable accessing port due to pain and burning in neck. Labs accessed from hand by  from [facility]

## 2019-07-21 NOTE — H&P (Addendum)
Admission History and Physical      Chief Complaint: Hyperbilirubinemia HPI: Rachel Vasquez is a 64 year old woman recently diagnosed with metastatic pancreas cancer involving the liver.  She presents to the office today for the first cycle of gemcitabine/Abraxane.  She reports nausea is better.  She vomited yesterday.  She typically has "dry heaves".  Bowels have moved regularly for the past 2 days.  Abdominal pain is significantly better.  She continues OxyContin with oxycodone as needed.  She denies fever.    Lab work unexpectedly showed significant elevation of the bilirubin.  She is being hospitalized for additional evaluation of probable biliary obstruction.    Past Medical History:  Diagnosis Date   GERD (gastroesophageal reflux disease)    Hiatal hernia    Liver lesion    Medical history non-contributory    Pancreatic mass    :  Past Surgical History:  Procedure Laterality Date   IR IMAGING GUIDED PORT INSERTION  07/14/2019  :  No current facility-administered medications for this encounter.  Current Outpatient Medications:    amLODipine (NORVASC) 10 MG tablet, Take 1 tablet (10 mg total) by mouth daily., Disp: 30 tablet, Rfl: 1   lidocaine-prilocaine (EMLA) cream, Apply 1 application topically as needed. Apply 1 hour prior to stick and cover with plastic wrap, Disp: 30 g, Rfl: 1   LORazepam (ATIVAN) 0.5 MG tablet, Take 1 tablet (0.5 mg total) by mouth every 6 (six) hours as needed for up to 15 doses (nausea/vomiting. May give PO or SL.)., Disp: 15 tablet, Rfl: 0   metoCLOPramide (REGLAN) 5 MG tablet, Take 1 tablet (5 mg total) by mouth 3 (three) times daily before meals., Disp: 90 tablet, Rfl: 0   Multiple Vitamins-Minerals (MULTIVITAMIN WITH MINERALS) tablet, Take 1 tablet by mouth daily., Disp: , Rfl:    ondansetron (ZOFRAN-ODT) 4 MG disintegrating tablet, Take 1 tablet (4 mg total) by mouth every 8 (eight) hours as needed for up to 30 doses for nausea or vomiting., Disp: 20  tablet, Rfl: 0   oxyCODONE (OXY IR/ROXICODONE) 5 MG immediate release tablet, Take 1 tablet (5 mg total) by mouth every 4 (four) hours as needed for up to 15 doses for moderate pain., Disp: 15 tablet, Rfl: 0   oxyCODONE (OXYCONTIN) 10 mg 12 hr tablet, Take 1 tablet (10 mg total) by mouth at bedtime for 7 doses., Disp: 7 tablet, Rfl: 0   oxyCODONE (OXYCONTIN) 20 mg 12 hr tablet, Take 1 tablet (20 mg total) by mouth every morning for 7 doses., Disp: 7 tablet, Rfl: 0   pantoprazole (PROTONIX) 40 MG tablet, Take 1 tablet (40 mg total) by mouth daily., Disp: 30 tablet, Rfl: 0   polyethylene glycol (MIRALAX / GLYCOLAX) 17 g packet, Take 17 g by mouth daily as needed for mild constipation., Disp: 14 each, Rfl: 0   potassium chloride SA (KLOR-CON) 20 MEQ tablet, Take 1 tablet (20 mEq total) by mouth daily for 15 days., Disp: 15 tablet, Rfl: 0   predniSONE (DELTASONE) 10 MG tablet, Take 1 tablet (10 mg total) by mouth daily with breakfast., Disp: 30 tablet, Rfl: 0   prochlorperazine (COMPAZINE) 10 MG tablet, Take 1 tablet (10 mg total) by mouth every 6 (six) hours as needed for nausea., Disp: 60 tablet, Rfl: 1   Past Medical History:  Diagnosis Date   GERD (gastroesophageal reflux disease)    Hiatal hernia    Liver lesion    Medical history non-contributory    Pancreatic mass  Past Surgical History:  Procedure Laterality Date   IR IMAGING GUIDED PORT INSERTION  07/14/2019    No Known Allergies  Family History  Problem Relation Age of Onset   Pancreatic cancer Father    Lung cancer Paternal Uncle      reports that she has never smoked. She has never used smokeless tobacco. She reports previous alcohol use. She reports that she does not use drugs.  ROS:  Physical: Temperature 98.3, heart rate 109, respirations 17, blood pressure 127/83, weight 238.2 pounds, oxygen saturation 97%  HEENT: Scleral icterus.  White coating over tongue. Chest: Lungs clear bilaterally. Cardiovascular:  Regular rate and rhythm. Abdomen: Abdomen soft and nontender.  No hepatomegaly. Extremities: No leg edema. Neuro: Alert and oriented.  Follows commands. Skin: Jaundice. Port-A-Cath without erythema.  Labs:  Results for orders placed or performed in visit on 07/21/19 (from the past 48 hour(s))  CMP ([place] only)     Status: Abnormal   Collection Time: 07/21/19  8:48 AM  Result Value Ref Range   Sodium 134 (L) 135 - 145 mmol/L   Potassium 3.3 (L) 3.5 - 5.1 mmol/L   Chloride 97 (L) 98 - 111 mmol/L   CO2 26 22 - 32 mmol/L   Glucose, Bld 199 (H) 70 - 99 mg/dL   BUN 9 8 - 23 mg/dL   Creatinine 0.77 0.44 - 1.00 mg/dL   Calcium 9.2 8.9 - 10.3 mg/dL   Total Protein 6.9 6.5 - 8.1 g/dL   Albumin 2.3 (L) 3.5 - 5.0 g/dL   AST 155 (H) 15 - 41 U/L   ALT 250 (H) 0 - 44 U/L   Alkaline Phosphatase 657 (H) 38 - 126 U/L   Total Bilirubin 25.9 (HH) 0.3 - 1.2 mg/dL    Comment: CRITICAL RESULT CALLED TO, READ BACK BY AND VERIFIED WITH: MARY MOON,RN @ 1010 BY J SCOTTON    GFR, Est Non Af Am >60 >60 mL/min   GFR, Est AFR Am >60 >60 mL/min   Anion gap 11 5 - 15    Comment: Performed at [facility], [address] [place], [place] [address]  CBC with Differential ([place] Only)     Status: Abnormal   Collection Time: 07/21/19  8:48 AM  Result Value Ref Range   WBC Count 17.5 (H) 4.0 - 10.5 K/uL   RBC 4.29 3.87 - 5.11 MIL/uL   Hemoglobin 10.2 (L) 12.0 - 15.0 g/dL    Comment: Reticulocyte Hemoglobin testing may be clinically indicated, consider ordering this additional test [id number]    HCT 29.4 (L) 36.0 - 46.0 %   MCV 68.5 (L) 80.0 - 100.0 fL   MCH 23.8 (L) 26.0 - 34.0 pg   MCHC 34.7 30.0 - 36.0 g/dL   RDW 21.6 (H) 11.5 - 15.5 %   Platelet Count 205 150 - 400 K/uL   nRBC 0.6 (H) 0.0 - 0.2 %   Neutrophils Relative % 84 %   Neutro Abs 14.7 (H) 1.7 - 7.7 K/uL   Lymphocytes Relative 6 %   Lymphs Abs 1.0 0.7 - 4.0 K/uL   Monocytes Relative 8 %   Monocytes  Absolute 1.4 (H) 0.1 - 1.0 K/uL   Eosinophils Relative 0 %   Eosinophils Absolute 0.1 0.0 - 0.5 K/uL   Basophils Relative 0 %   Basophils Absolute 0.0 0.0 - 0.1 K/uL   Immature Granulocytes 2 %   Abs Immature Granulocytes 0.28 (H) 0.00 - 0.07 K/uL  Comment: Performed at [facility], [place] [address] [place], [place] [address]  Research Labs     Status: None   Collection Time: 07/21/19  8:48 AM  Result Value Ref Range   Research Labs Collected by Laboratory     Comment: Performed at [facility], [place] [address] [place], [place] [address]   No results found.  Assessment/Plan  1. Metastatic pancreatic adenocarcinoma             -CT of the abdomen pelvis on 07/02/2019 showed a 3.4 x 2.2 x 3              cm mass in the head of the pancreas with multiple ill-defined              hypodense liver masses.             -CT of the chest on 07/07/2019 showed no evidence of thoracic              metastasis.             -Ultrasound guided liver biopsy on 07/07/2019 demonstrated              metastatic pancreatic adenocarcinoma.             -Elevated CA 19-9 2.  Abdominal pain, nausea, vomiting secondary to pancreatic adenocarcinoma 3.  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  Chronicity, now with mild microcytic anemia 4.  Hypokalemia 5.  Uterine fibroids 6.  Abdominal pain secondary to #1 7.  Nausea 8.  Hyperbilirubinemia, likely due to biliary obstruction  Rachel Vasquez is a 64 year old woman recently diagnosed with metastatic pancreas cancer involving the liver.  She was scheduled to begin treatment today with gemcitabine/Abraxane.  Labs unexpectedly showed significant hyperbilirubinemia.  She understands this is likely due to biliary obstruction and we will need to hold the chemotherapy until this is addressed.  She is being admitted for further evaluation.  We will obtain a CT scan.  Dr. Benay Spice has spoken with  gastroenterology.  Ned Card ANP/GNP-BC 07/21/2019, 12:30 PM  Rachel Vasquez was interviewed and examined.  She presented today for cycle 1 gemcitabine/Abraxane, but she has developed hyperbilirubinemia.  Chemotherapy will be placed on hold.  I contacted Dr. Carlean Purl.  Rachel Vasquez will be admitted for a GI evaluation to include a CT abdomen and probable ERCP.  Rachel Vasquez understands we will need to hold chemotherapy until the elevated bilirubin has improved.  Hopefully the nausea and abdominal pain will improve following placement of a bile duct stent.

## 2019-07-21 NOTE — Consult Note (Addendum)
Referring Provider:  [facility]         Primary Care Physician:  Patient, [name] Per Primary Gastroenterologist: none             We were asked to see this patient for:    Dr. Benay Spice requesting consultation for worsening jaundice              ASSESSMENT /  PLAN     20. 64 year old female with recently diagnosed pancreatic adenocarcinoma.  She was to start Chemotherapy today but instead admitted by Oncology for vomiting and progressive rise in bilirubin. She says her abdominal pain is controlled with home oral pain meds.  --no further vomiting today but hasn't eaten anything. So far she is tolerating the oral contrast for CT scan.  --Continue anti-emetics, pain control --Await CT scan results. Suspects biliary obstruction in which case she will need ERCP with stent.  --In case ERCP is needed will make NPO. Lovenox for DVT prophylaxis due at 8pm. Will hold this dose, Dr. Carlean Purl would be doing the ERCP so he can resume if feels appropriate  2. Leukocytosis. Afebrile. Cholangitis unusual in malignant biliary obstructions so I haven't initiated antibiotics.  -follow up CBC in am.   3. Hypokalemia, repletion in progress ( getting in IV fluids)  4. Microcytic anemia, hgb stable ~ 10.     [facility] Attending   I have taken an interval history, reviewed the chart and examined the patient. I agree with the Advanced Practitioner's note, impression and recommendations.     Jaundice - suspect new obstruction in setting of metastatic pancreatic cancer Will get the Ct and if indicated maybe ERCP tomorrow if schedule permits. The risks and benefits as well as alternatives of endoscopic procedure(s) have been discussed and reviewed. All questions answered. The patient agrees to proceed.   Gatha Mayer, MD, [facility] 07/21/2019 6:30 PM  [phone]  HPI:    Chief Complaint: abdominal pain, worsening liver tests  Rachel Vasquez is a relatively healthy 64  y.o. female diagnosed with pancreatic cancer late December after presenting to ED with nausea, vomiting , loss or appetite and epigastric pain. . CT scan on 07/02/19 revealed ill-defined mass at the head of the pancreas  Measuring 3.4 x 2.2 x 3 cm and multiple ill defined hypodense liver masses consistent with metastatic disease. Liver biopsy on 07/07/19 c/w metastatic pancreatic adenocarcinoma. . Liver tests at the time were normal.  Seen at Oncology today for first cycle of gemcitabine / abraxane. She reported vomiting (yesterday) and abdominal pain. Labs remarkable for progressive rise in bilirubin from ~ 18 five days ago to 26. She has been admitted for further workup or probable biliary obstruction. .  Rachel Vasquez hasn't vomited today but has only had a bite of a sandwhich. She isn't having any abdominal pain since taking pain medication earlier this am. Bowels moving okay. Denies fevers / chills. She is currently drinking oral contrast for CT scan.    Past Medical History:  Diagnosis Date  . GERD (gastroesophageal reflux disease)   . Hiatal hernia   . Medical history non-contributory   . Pancreatic mass     Past Surgical History:  Procedure Laterality Date  . IR IMAGING GUIDED PORT INSERTION  07/14/2019    Prior to Admission medications   Medication Sig Start Date End Date Taking? Authorizing Provider  amLODipine (NORVASC) 10 MG tablet Take 1 tablet (10 mg total) by mouth daily. 07/18/19 09/16/19  Antonieta Pert,  MD  lidocaine-prilocaine (EMLA) cream Apply 1 application topically as needed. Apply 1 hour prior to stick and cover with plastic wrap 07/16/19   Ladell Pier, MD  LORazepam (ATIVAN) 0.5 MG tablet Take 1 tablet (0.5 mg total) by mouth every 6 (six) hours as needed for up to 15 doses (nausea/vomiting. May give PO or SL.). 07/17/19   Antonieta Pert, MD  metoCLOPramide (REGLAN) 5 MG tablet Take 1 tablet (5 mg total) by mouth 3 (three) times daily before meals. 07/17/19 08/16/19  Antonieta Pert, MD    Multiple Vitamins-Minerals (MULTIVITAMIN WITH MINERALS) tablet Take 1 tablet by mouth daily.    [provider]  ondansetron (ZOFRAN-ODT) 4 MG disintegrating tablet Take 1 tablet (4 mg total) by mouth every 8 (eight) hours as needed for up to 30 doses for nausea or vomiting. 07/17/19   Antonieta Pert, MD  oxyCODONE (OXY IR/ROXICODONE) 5 MG immediate release tablet Take 1 tablet (5 mg total) by mouth every 4 (four) hours as needed for up to 15 doses for moderate pain. 07/17/19   Antonieta Pert, MD  oxyCODONE (OXYCONTIN) 10 mg 12 hr tablet Take 1 tablet (10 mg total) by mouth at bedtime for 7 doses. 07/17/19 07/24/19  Antonieta Pert, MD  oxyCODONE (OXYCONTIN) 20 mg 12 hr tablet Take 1 tablet (20 mg total) by mouth every morning for 7 doses. 07/18/19 07/25/19  Antonieta Pert, MD  pantoprazole (PROTONIX) 40 MG tablet Take 1 tablet (40 mg total) by mouth daily. 07/17/19 08/16/19  Antonieta Pert, MD  polyethylene glycol (MIRALAX / GLYCOLAX) 17 g packet Take 17 g by mouth daily as needed for mild constipation. 07/17/19   Antonieta Pert, MD  potassium chloride SA (KLOR-CON) 20 MEQ tablet Take 1 tablet (20 mEq total) by mouth daily for 15 days. 07/17/19 08/01/19  Antonieta Pert, MD  predniSONE (DELTASONE) 10 MG tablet Take 1 tablet (10 mg total) by mouth daily with breakfast. 07/18/19 08/17/19  Antonieta Pert, MD  prochlorperazine (COMPAZINE) 10 MG tablet Take 1 tablet (10 mg total) by mouth every 6 (six) hours as needed for nausea. 07/16/19   Ladell Pier, MD    Current Facility-Administered Medications  Medication Dose Route Frequency Provider Last Rate Last Admin  . 0.9 % NaCl with KCl 20 mEq/ L  infusion   Intravenous Continuous Owens Shark, NP 125 mL/hr at 07/21/19 1652 New Bag at 07/21/19 1652  . [START ON 07/22/2019] amLODipine (NORVASC) tablet 10 mg  10 mg Oral Daily Owens Shark, NP      . Derrill Memo ON 07/22/2019] enoxaparin (LOVENOX) injection 40 mg  40 mg Subcutaneous Q24H Tye Savoy M, NP      . iohexol (OMNIPAQUE) 9  MG/ML oral solution 500 mL  500 mL Oral Q1H Owens Shark, NP      . iohexol (OMNIPAQUE) 9 MG/ML oral solution           . LORazepam (ATIVAN) tablet 0.5 mg  0.5 mg Oral Q6H PRN Owens Shark, NP      . metoCLOPramide (REGLAN) tablet 5 mg  5 mg Oral TID AC Owens Shark, NP   5 mg at 07/21/19 1759  . ondansetron (ZOFRAN-ODT) disintegrating tablet 4 mg  4 mg Oral Q8H PRN Owens Shark, NP      . oxyCODONE (Oxy IR/ROXICODONE) immediate release tablet 5 mg  5 mg Oral Q4H PRN Owens Shark, NP   5 mg at 07/21/19 1800  . oxyCODONE (OXYCONTIN) 12 hr tablet 10  mg  10 mg Oral QHS Owens Shark, NP      . Derrill Memo ON 07/22/2019] oxyCODONE (OXYCONTIN) 12 hr tablet 20 mg  20 mg Oral q morning - 10a Owens Shark, NP      . pantoprazole (PROTONIX) EC tablet 40 mg  40 mg Oral Daily Ned Card K, NP      . polyethylene glycol (MIRALAX / GLYCOLAX) packet 17 g  17 g Oral Daily PRN Owens Shark, NP      . Derrill Memo ON 07/22/2019] predniSONE (DELTASONE) tablet 10 mg  10 mg Oral Q breakfast Owens Shark, NP      . prochlorperazine (COMPAZINE) tablet 10 mg  10 mg Oral Q6H PRN Owens Shark, NP        Allergies as of 07/21/2019  . (No Known Allergies)    Family History  Problem Relation Age of Onset  . Pancreatic cancer Father   . Lung cancer Paternal Uncle     Social History   Tobacco Use  . Smoking status: Never Smoker  . Smokeless tobacco: Never Used  Substance Use Topics  . Alcohol use: Not Currently  . Drug use: Never   Social History   Social History Narrative   Single   2 kids     Review of Systems: All systems reviewed and negative except where noted in HPI.  Physical Exam: Vital signs in last 24 hours: Temp:  [97.8 F (36.6 C)-98.3 F (36.8 C)] 97.8 F (36.6 C) (01/18 1516) Pulse Rate:  [102-109] 106 (01/18 1516) Resp:  [16-18] 18 (01/18 1516) BP: (121-127)/(83-86) 125/86 (01/18 1516) SpO2:  [95 %-97 %] 95 % (01/18 1516) Weight:  [id number] kg] 108 kg (01/18 0921)    General:   Alert in NAD Psych:  Pleasant, cooperative. Normal mood and affect. Eyes:  Pupils equal, sclera clear, no icterus.   Conjunctiva pink. Ears:  Normal auditory acuity. Nose:  No deformity, discharge,  or lesions. Neck:  Supple; no masses Lungs:  Clear throughout to auscultation.   No wheezes, crackles, or rhonchi.  Heart:  Regular rate and rhythm; no murmurs, no lower extremity edema Abdomen:  Soft, non-distended, nontender, BS active.    Rectal:  Deferred  Msk:  Symmetrical without gross deformities. . Neurologic:  Alert and  oriented x4;  grossly normal neurologically. Skin:  Intact without significant lesions or rashes.   Intake/Output from previous day: No intake/output data recorded. Intake/Output this shift: No intake/output data recorded.  Lab Results: Recent Labs    07/21/19 0848  WBC 17.5*  HGB 10.2*  HCT 29.4*  PLT 205   BMET Recent Labs    07/21/19 0848  NA 134*  K 3.3*  CL 97*  CO2 26  GLUCOSE 199*  BUN 9  CREATININE 0.77  CALCIUM 9.2   LFT Recent Labs    07/21/19 0848  PROT 6.9  ALBUMIN 2.3*  AST 155*  ALT 250*  ALKPHOS 657*  BILITOT 25.9*     . CBC Latest Ref Rng & Units 07/21/2019 07/16/2019 07/15/2019  WBC 4.0 - 10.5 K/uL 17.5(H) 10.4 9.1  Hemoglobin 12.0 - 15.0 g/dL 10.2(L) 10.5(L) 10.8(L)  Hematocrit 36.0 - 46.0 % 29.4(L) 32.1(L) 32.0(L)  Platelets 150 - 400 K/uL 205 158 154    . CMP Latest Ref Rng & Units 07/21/2019 07/16/2019 07/15/2019  Glucose 70 - 99 mg/dL 199(H) 137(H) 172(H)  BUN 8 - 23 mg/dL 9 6(L) 6(L)  Creatinine 0.44 - 1.00 mg/dL 0.77 0.53 0.51  Sodium 135 - 145 mmol/L 134(L) 136 134(L)  Potassium 3.5 - 5.1 mmol/L 3.3(L) 3.9 3.5  Chloride 98 - 111 mmol/L 97(L) 101 98  CO2 22 - 32 mmol/L 26 26 27   Calcium 8.9 - 10.3 mg/dL 9.2 9.0 8.6(L)  Total Protein 6.5 - 8.1 g/dL 6.9 6.6 -  Total Bilirubin 0.3 - 1.2 mg/dL 25.9(HH) 17.8(H) -  Alkaline Phos 38 - 126 U/L 657(H) 496(H) -  AST 15 - 41 U/L 155(H) 222(H) -  ALT  0 - 44 U/L 250(H) 337(H) -   Studies/Results: No results found.  Active Problems:   Pancreatic cancer metastasized to liver [facility])    Tye Savoy, NP-C @  07/21/2019, 6:28 PM

## 2019-07-22 DIAGNOSIS — C250 Malignant neoplasm of head of pancreas: Secondary | ICD-10-CM

## 2019-07-22 LAB — COMPREHENSIVE METABOLIC PANEL
ALT: 114 U/L — ABNORMAL HIGH (ref 0–44)
ALT: 203 U/L — ABNORMAL HIGH (ref 0–44)
AST: 81 U/L — ABNORMAL HIGH (ref 15–41)
AST: 140 U/L — ABNORMAL HIGH (ref 15–41)
Albumin: 1.2 g/dL — ABNORMAL LOW (ref 3.5–5.0)
Albumin: 2.1 g/dL — ABNORMAL LOW (ref 3.5–5.0)
Alkaline Phosphatase: 308 U/L — ABNORMAL HIGH (ref 38–126)
Alkaline Phosphatase: 542 U/L — ABNORMAL HIGH (ref 38–126)
Anion gap: 5 (ref 5–15)
Anion gap: 10 (ref 5–15)
BUN: 8 mg/dL (ref 8–23)
BUN: 12 mg/dL (ref 8–23)
CO2: 16 mmol/L — ABNORMAL LOW (ref 22–32)
CO2: 25 mmol/L (ref 22–32)
Calcium: 5.4 mg/dL — CL (ref 8.9–10.3)
Calcium: 9 mg/dL (ref 8.9–10.3)
Chloride: 118 mmol/L — ABNORMAL HIGH (ref 98–111)
Chloride: 96 mmol/L — ABNORMAL LOW (ref 98–111)
Creatinine, Ser: <0.3 mg/dL — ABNORMAL LOW (ref 0.44–1.00)
Creatinine, Ser: <0.3 mg/dL — ABNORMAL LOW (ref 0.44–1.00)
Glucose, Bld: 95 mg/dL (ref 70–99)
Glucose, Bld: 155 mg/dL — ABNORMAL HIGH (ref 70–99)
Potassium: <2 mmol/L — CL (ref 3.5–5.1)
Potassium: 3.4 mmol/L — ABNORMAL LOW (ref 3.5–5.1)
Sodium: 139 mmol/L (ref 135–145)
Sodium: 131 mmol/L — ABNORMAL LOW (ref 135–145)
Total Bilirubin: 14.8 mg/dL — ABNORMAL HIGH (ref 0.3–1.2)
Total Bilirubin: 26.7 mg/dL (ref 0.3–1.2)
Total Protein: 3.5 g/dL — ABNORMAL LOW (ref 6.5–8.1)
Total Protein: 6.3 g/dL — ABNORMAL LOW (ref 6.5–8.1)

## 2019-07-22 MED ORDER — CHLORHEXIDINE GLUCONATE CLOTH 2 % EX PADS
6.0000 | MEDICATED_PAD | Freq: Every day | CUTANEOUS | Status: DC
  Administered 2019-07-22 – 2019-07-26 (×5): 6 via TOPICAL

## 2019-07-22 NOTE — Progress Notes (Signed)
     I reviewed CT with radiology  CBD 6 mm  There is thrombus - tumor vs clot in R portal vein  No role for ERCP   Will let Dr. Benay Spice know   Diet ordered  Gatha Mayer, MD, [facility] 07/22/2019 8:56 AM

## 2019-07-22 NOTE — Progress Notes (Addendum)
CRITICAL VALUE ALERT  Critical Value:  K >2  Date & Time Notied:  1/19 0712  Provider Notified: Benay Spice  Orders Received/Actions taken: awaiting/pending Recommend lab draw, difficult to obtain specimen from port  called lab for collection per VO

## 2019-07-22 NOTE — Progress Notes (Addendum)
HEMATOLOGY-ONCOLOGY PROGRESS NOTE  SUBJECTIVE: Reports fatigue. Denies nausea and vomiting. Reports abdominal discomfort. For ERCP later today.   Oncology History  Pancreatic adenocarcinoma ([place])  07/10/2019 Initial Diagnosis   Pancreatic adenocarcinoma ([place])   07/21/2019 -  Chemotherapy   The patient had PACLitaxel-protein bound (ABRAXANE) chemo infusion 225 mg, 100 mg/m2 = 225 mg (original dose ), Intravenous,  Once, 0 of 4 cycles Dose modification: 100 mg/m2 (Cycle 1, Reason: Provider Judgment) gemcitabine (GEMZAR) 2,280 mg in sodium chloride 0.9 % 250 mL chemo infusion, 1,000 mg/m2 = 2,280 mg, Intravenous,  Once, 0 of 4 cycles  for chemotherapy treatment.      PHYSICAL EXAMINATION:  Vitals:   07/21/19 2011 07/22/19 0539  BP: (!) 136/102 115/68  Pulse: (!) 104 99  Resp: 16   Temp: (!) 97.3 F (36.3 C) 98.2 F (36.8 C)  SpO2: 97% 97%   Filed Weights   07/21/19 1516  Weight: 238 lb 6.4 oz (108.1 kg)    Intake/Output from previous day: 01/18 0701 - 01/19 0700 In: 847.2 [I.V.:847.2] Out: -   GENERAL:alert, no distress and comfortable EYES: scleral icterus noted OROPHARYNX: white coating on tongue  LUNGS: clear to auscultation and percussion with normal breathing effort HEART: regular rate & rhythm and no murmurs and no lower extremity edema ABDOMEN:abdomen soft, non-tender and normal bowel sounds Musculoskeletal:no cyanosis of digits and no clubbing  NEURO: alert & oriented x 3 with fluent speech, no focal motor/sensory deficits  PAC without erythema or edema  LABORATORY DATA:  I have reviewed the data as listed CMP Latest Ref Rng & Units 07/22/2019 07/21/2019 07/16/2019  Glucose 70 - 99 mg/dL 95 199(H) 137(H)  BUN 8 - 23 mg/dL 8 9 6(L)  Creatinine 0.44 - 1.00 mg/dL <0.30(L) 0.77 0.53  Sodium 135 - 145 mmol/L 139 134(L) 136  Potassium 3.5 - 5.1 mmol/L <2.0(LL) 3.3(L) 3.9  Chloride 98 - 111 mmol/L 118(H) 97(L) 101  CO2 22 - 32 mmol/L 16(L) 26 26  Calcium 8.9 - 10.3  mg/dL 5.4(LL) 9.2 9.0  Total Protein 6.5 - 8.1 g/dL 3.5(L) 6.9 6.6  Total Bilirubin 0.3 - 1.2 mg/dL 14.8(H) 25.9(HH) 17.8(H)  Alkaline Phos 38 - 126 U/L 308(H) 657(H) 496(H)  AST 15 - 41 U/L 81(H) 155(H) 222(H)  ALT 0 - 44 U/L 114(H) 250(H) 337(H)    Lab Results  Component Value Date   WBC 17.5 (H) 07/21/2019   HGB 10.2 (L) 07/21/2019   HCT 29.4 (L) 07/21/2019   MCV 68.5 (L) 07/21/2019   PLT 205 07/21/2019   NEUTROABS 14.7 (H) 07/21/2019    CT CHEST W CONTRAST  Result Date: 07/07/2019 CLINICAL DATA:  Liver mass.  Concern for hepatic metastasis EXAM: CT CHEST WITH CONTRAST TECHNIQUE: Multidetector CT imaging of the chest was performed during intravenous contrast administration. CONTRAST:  42mL OMNIPAQUE IOHEXOL 300 MG/ML  SOLN COMPARISON:  CT 07/02/2019 FINDINGS: Cardiovascular: No significant vascular findings. Normal heart size. No pericardial effusion. Mediastinum/Nodes: No axillary supraclavicular adenopathy. Multiple nodules within the thyroid gland measures 12 mm or less. This is not meet the size requirement for follow-up imaging. No mediastinal lymphadenopathy. Moderate to large hiatal hernia posterior to the LEFT heart. Lungs/Pleura: Linear atelectasis in the lingula and medial RIGHT middle lobe. No suspicious nodularity. Upper Abdomen: Several low-density suspicious lesions in the liver again demonstrated. Inflammation potential mass in the pancreatic head again noted. No interval change from 07/02/2019. Musculoskeletal: No aggressive osseous lesion. IMPRESSION: 1. No evidence of thoracic metastasis. 2. Low-density lesions in  the liver concerning for metastasis. See recent abdomen CT 07/02/2019. 3. Inflammation head of pancreas with concern for underlying neoplasm. Recommend appropriate tumor biomarkers. Electronically Signed   By: Suzy Bouchard M.D.   On: 07/07/2019 08:23   CT ABDOMEN PELVIS W CONTRAST  Result Date: 07/21/2019 CLINICAL DATA:  History of recently diagnosed  metastatic pancreatic cancer. EXAM: CT ABDOMEN AND PELVIS WITH CONTRAST TECHNIQUE: Multidetector CT imaging of the abdomen and pelvis was performed using the standard protocol following bolus administration of intravenous contrast. CONTRAST:  112mL OMNIPAQUE IOHEXOL 300 MG/ML  SOLN COMPARISON:  July 02, 2019 FINDINGS: Lower chest: Mild atelectasis is seen within the anterior aspect of the bilateral lung bases. Hepatobiliary: Numerous heterogeneous low-attenuation liver lesions are seen. These are mildly increased in size when compared to the prior study. Ill-defined gallstones are seen within a contracted gallbladder. Pancreas: A 5.6 cm x 6.3 cm x 5.4 cm complex, partially cystic mass is seen involving the body and head of the pancreas Spleen: Normal in size without focal abnormality. Adrenals/Urinary Tract: Adrenal glands are unremarkable. Kidneys are normal, without renal calculi, focal lesion, or hydronephrosis. Bladder is unremarkable. Stomach/Bowel: There is a large gastric hernia. Appendix appears normal. No evidence of bowel wall thickening, distention, or inflammatory changes. Vascular/Lymphatic: Reproductive: A 12.8 cm x 8.6 cm heterogeneous mass is seen extending from the anterolateral aspect of the uterine fundus on the left. This contains soft tissue and fat density components. Additional 6.8 cm x 6.9 cm, 5.0 cm diameter and 3.7 cm diameter homogeneous uterine mass is seen. Other: Small fat containing ventral hernias are seen along the midline of the lower abdomen. Musculoskeletal: No acute or significant osseous findings. IMPRESSION: 1. 5.6 cm x 6.3 cm x 5.4 cm complex pancreatic mass which is increased in size when compared to the prior study dated July 02, 2019. 2. Numerous heterogeneous liver lesions consistent with hepatic metastasis. 3. Cholelithiasis. 4. Large gastric hernia. 5. Multiple large uterine masses which may represent large uterine fibroids. Electronically Signed   By: Virgina Norfolk M.D.   On: 07/21/2019 21:34   CT Abdomen Pelvis W Contrast  Result Date: 07/02/2019 CLINICAL DATA:  Abdominal pain with nausea and vomiting EXAM: CT ABDOMEN AND PELVIS WITH CONTRAST TECHNIQUE: Multidetector CT imaging of the abdomen and pelvis was performed using the standard protocol following bolus administration of intravenous contrast. CONTRAST:  132mL OMNIPAQUE IOHEXOL 300 MG/ML  SOLN COMPARISON:  CT 04/26/2004 FINDINGS: Lower chest: Lung bases demonstrate subsegmental atelectasis at the lingula, right middle lobe and inferior right upper lobe. No acute consolidation or pleural effusion. The heart size is within normal limits. Irregular moderate hiatal hernia with wall thickening. This is increased compared to prior. Hepatobiliary: Scattered hypodense liver lesions, some of which were present on comparison study. Multiple new ill-defined hypodense liver masses concerning for metastatic disease. Largest suspicious lesion is seen in the right hepatic dome and measures 2.5 cm. Multiple calcified gallstones. No biliary dilatation. Pancreas: Generalized inflammatory changes about the pancreas. Development of mild ductal dilatation. Ill-defined hypodense mass at the head of the pancreas measuring approximately 3.4 cm transverse by 2.2 cm AP by 3 cm craniocaudad. Spleen: Normal in size without focal abnormality. Adrenals/Urinary Tract: Adrenal glands are unremarkable. Kidneys are normal, without renal calculi, focal lesion, or hydronephrosis. Bladder is unremarkable. Stomach/Bowel: No dilated small bowel. Negative for colon wall thickening. Negative appendix. Vascular/Lymphatic: Nonaneurysmal aorta. Hazy appearance of mesenteric root with subcentimeter nodes. Multiple small nodes or punctate nodules in the right lower quadrant/right  gutter. Hypoenhancement of the distal splenic vein and superior mesenteric vein. Reproductive: Markedly enlarged uterus with multiple masses, increased compared to prior.  Other: Negative for free air or significant ascites. Anterior abdominal wall laxity. Small supraumbilical fat containing ventral hernia Musculoskeletal: No acute or suspicious osseous abnormality. IMPRESSION: 1. Generalized pancreatic inflammation. There is an ill-defined hypodense suspicious mass at the head of the pancreas measuring 3.4 x 2.2 x 3 cm. 2. Multiple ill-defined hypodense liver masses, consistent with metastatic disease. 3. Hypoenhancement of the splenic vein and superior mesenteric veins near their confluence, potentially occluded. 4. Multiple punctate nodules or small nodes in the right lower quadrant/gutter, cannot exclude metastatic disease 5. Moderate irregular hiatal hernia with some wall thickening, could be correlated with endoscopy 6. Bulky enlargement of the uterus with multiple large masses, increased compared to prior. Electronically Signed   By: Donavan Foil M.D.   On: 07/02/2019 22:17   US BIOPSY (LIVER)  Result Date: 07/07/2019 INDICATION: Concern for metastatic pancreatic cancer. Please perform ultrasound-guided liver lesion biopsy for tissue diagnostic purposes. EXAM: ULTRASOUND GUIDED LIVER LESION BIOPSY COMPARISON:  CT abdomen and pelvis-07/02/2019 MEDICATIONS: None ANESTHESIA/SEDATION: Fentanyl 100 mcg IV; Versed 2 mg IV Total Moderate Sedation time:  17 Minutes. The patient's level of consciousness and vital signs were monitored continuously by radiology nursing throughout the procedure under my direct supervision. COMPLICATIONS: None immediate. PROCEDURE: Informed written consent was obtained from the patient after a discussion of the risks, benefits and alternatives to treatment. The patient understands and consents the procedure. A timeout was performed prior to the initiation of the procedure. Ultrasound scanning was performed of the right upper abdominal quadrant demonstrates an ill-defined approximately 2.9 x 3.1 x 2.2 cm hypoechoic nodule/mass within the dome of the  right lobe of the liver correlating with the indeterminate mass seen on preceding abdominal CT image 15, series 3. Note, imaging was degraded secondary to patient body habitus well as well as the lesion being located within the dome of the liver with associated interposition of lung. The procedure was planned. The right upper abdominal quadrant was prepped and draped in the usual sterile fashion. The overlying soft tissues were anesthetized with 1% lidocaine with epinephrine. A 17 gauge, 6.8 cm co-axial needle was advanced into a peripheral aspect of the lesion. This was followed by 6 core biopsies with an 18 gauge core device under direct ultrasound guidance. The coaxial needle tract was embolized with a small amount of Gel-Foam slurry and superficial hemostasis was obtained with manual compression. Post procedural scanning was negative for definitive area of hemorrhage or additional complication. A dressing was placed. The patient tolerated the procedure well without immediate post procedural complication. IMPRESSION: Technically challenging though ultimately successful ultrasound guided core needle biopsy of indeterminate mass within the dome of the right lobe of the liver. Electronically Signed   By: Sandi Mariscal M.D.   On: 07/07/2019 13:34   DG Abd 2 Views  Result Date: 07/08/2019 CLINICAL DATA:  Abdominal pain and distension x3 days. EXAM: ABDOMEN - 2 VIEW COMPARISON:  None. FINDINGS: The bowel gas pattern is normal. A moderate to marked amount of stool is seen within the ascending colon. There is no evidence of free air. No radio-opaque calculi or other significant radiographic abnormality is seen. A radiopaque tubal ligation clip is seen within the pelvis on the right. An additional tubal ligation clip is seen overlying the superior aspect of the sacrum on the left. A 4 mm focal opacity is seen overlying the  symphysis pubis on the left. IMPRESSION: 1. Normal bowel gas pattern. No evidence of free air.  Electronically Signed   By: Virgina Norfolk M.D.   On: 07/08/2019 16:27   DG Abd Portable 1V  Result Date: 07/10/2019 CLINICAL DATA:  Abdominal pain EXAM: PORTABLE ABDOMEN - 1 VIEW COMPARISON:  07/08/2019 FINDINGS: Bowel gas pattern remains unremarkable. Stool burden is overall mild with greater burden within the ascending colon. Tubal ligation clips noted. IMPRESSION: Normal bowel gas pattern. Electronically Signed   By: Macy Mis M.D.   On: 07/10/2019 13:20   IR IMAGING GUIDED PORT INSERTION  Result Date: 07/14/2019 CLINICAL DATA:  Pancreatic carcinoma, needs poor for planned chemotherapy regimen EXAM: TUNNELED PORT CATHETER PLACEMENT WITH ULTRASOUND AND FLUOROSCOPIC GUIDANCE FLUOROSCOPY TIME:  0.6 minute; 52  uGym2 DAP ANESTHESIA/SEDATION: Intravenous Fentanyl 51mcg and Versed 2mg  were administered as conscious sedation during continuous monitoring of the patient's level of consciousness and physiological / cardiorespiratory status by the radiology RN, with a total moderate sedation time of 21 minutes. TECHNIQUE: The procedure, risks, benefits, and alternatives were explained to the patient. Questions regarding the procedure were encouraged and answered. The patient understands and consents to the procedure. As antibiotic prophylaxis, cefazolin 2 g was ordered pre-procedure and administered intravenously within one hour of incision. Patency of the right IJ vein was confirmed with ultrasound with image documentation. An appropriate skin site was determined. Skin site was marked. Region was prepped using maximum barrier technique including cap and mask, sterile gown, sterile gloves, large sterile sheet, and Chlorhexidine as cutaneous antisepsis. The region was infiltrated locally with 1% lidocaine. Under real-time ultrasound guidance, the right IJ vein was accessed with a 21 gauge micropuncture needle; the needle tip within the vein was confirmed with ultrasound image documentation. Needle was  exchanged over a 018 guidewire for transitional dilator, and vascular measurement was performed. A small incision was made on the right anterior chest wall and a subcutaneous pocket fashioned. The power-injectable port was positioned and its catheter tunneled to the right IJ dermatotomy site. The transitional dilator was exchanged over an Amplatz wire for a peel-away sheath, through which the port catheter, which had been trimmed to the appropriate length, was advanced and positioned under fluoroscopy with its tip at the cavoatrial junction. Spot chest radiograph confirms good catheter position and no pneumothorax. The port was flushed per protocol. The pocket was closed with deep interrupted and subcuticular continuous 3-0 Monocryl sutures. The incisions were covered with Dermabond then covered with a sterile dressing. The patient tolerated the procedure well. COMPLICATIONS: COMPLICATIONS None immediate IMPRESSION: Technically successful right IJ power-injectable port catheter placement. Ready for routine use. Electronically Signed   By: Lucrezia Europe M.D.   On: 07/14/2019 13:39    ASSESSMENT AND PLAN: 1. Metastatic pancreatic adenocarcinoma -CT of the abdomen pelvis on 07/02/2019 showed a 3.4 x 2.2 x 3  cm mass in the head of the pancreas with multiple ill-defined  hypodense liver masses. -CT of the chest on 07/07/2019 showed no evidence of thoracic  metastasis. -Ultrasound guided liver biopsy on 07/07/2019 demonstrated  metastatic pancreatic adenocarcinoma. -Elevated CA 19-9 2. 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 Chronicity, now with mild microcytic anemia 4. Hypokalemia 5. Uterine fibroids 6. Abdominal pain secondary to #1 7. Nausea 8.  Hyperbilirubinemia, likely due to biliary obstruction  Ms. Vannatter  appears stable.  The patient has been evaluated by GI and planning for possible ERCP later this morning.  CT scan showed increase in size of her pancreatic mass and multiple liver metastases.  CT report did not indicate any evidence of biliary obstruction.  Total bili remains elevated this morning.  She has mild hypokalemia with potassium in her IV fluids.  1.  Await further recommendations from GI.  For possible ERCP later today. 2.  We will check CMET daily to follow total bilirubin. 3.  Continue potassium in her IV fluids.  Recheck CMET in the morning. 4.  Continue pain medications and antiemetics   LOS: 1 day   Mikey Bussing, DNP, AGPCNP-BC, AOCNP 07/22/19 Ms. Morrow was interviewed and examined. She appears unchanged. I reviewed the CT images from 07/21/2019. There is no clear explanation for the marked rise in the bilirubin. Dr. Carlean Purl does not recommend an ERCP. Chemotherapy options are very limited unless the hyperbilirubinemia improves. I will discuss the case further with Dr. Carlean Purl.

## 2019-07-22 NOTE — H&P (View-Only) (Signed)
     I reviewed CT with radiology  CBD 6 mm  There is thrombus - tumor vs clot in R portal vein  No role for ERCP   Will let Dr. Benay Spice know   Diet ordered  Gatha Mayer, MD, [facility] 07/22/2019 8:56 AM

## 2019-07-23 ENCOUNTER — Encounter: Payer: Self-pay | Admitting: *Deleted

## 2019-07-23 ENCOUNTER — Inpatient Hospital Stay (HOSPITAL_COMMUNITY): Payer: BC Managed Care – PPO

## 2019-07-23 DIAGNOSIS — C801 Malignant (primary) neoplasm, unspecified: Secondary | ICD-10-CM

## 2019-07-23 DIAGNOSIS — C259 Malignant neoplasm of pancreas, unspecified: Secondary | ICD-10-CM

## 2019-07-23 DIAGNOSIS — K831 Obstruction of bile duct: Secondary | ICD-10-CM

## 2019-07-23 LAB — COMPREHENSIVE METABOLIC PANEL
ALT: 198 U/L — ABNORMAL HIGH (ref 0–44)
AST: 152 U/L — ABNORMAL HIGH (ref 15–41)
Albumin: 2.1 g/dL — ABNORMAL LOW (ref 3.5–5.0)
Alkaline Phosphatase: 567 U/L — ABNORMAL HIGH (ref 38–126)
Anion gap: 11 (ref 5–15)
BUN: 8 mg/dL (ref 8–23)
CO2: 23 mmol/L (ref 22–32)
Calcium: 9 mg/dL (ref 8.9–10.3)
Chloride: 100 mmol/L (ref 98–111)
Creatinine, Ser: <0.3 mg/dL — ABNORMAL LOW (ref 0.44–1.00)
Glucose, Bld: 166 mg/dL — ABNORMAL HIGH (ref 70–99)
Potassium: 3.2 mmol/L — ABNORMAL LOW (ref 3.5–5.1)
Sodium: 134 mmol/L — ABNORMAL LOW (ref 135–145)
Total Bilirubin: 28 mg/dL (ref 0.3–1.2)
Total Protein: 6.4 g/dL — ABNORMAL LOW (ref 6.5–8.1)

## 2019-07-23 MED ORDER — GADOBUTROL 1 MMOL/ML IV SOLN
10.0000 mL | Freq: Once | INTRAVENOUS | Status: AC | PRN
  Administered 2019-07-23: 12:00:00 10 mL via INTRAVENOUS

## 2019-07-23 MED ORDER — POTASSIUM CHLORIDE CRYS ER 20 MEQ PO TBCR
20.0000 meq | EXTENDED_RELEASE_TABLET | Freq: Two times a day (BID) | ORAL | Status: DC
  Administered 2019-07-23: 20 meq via ORAL
  Filled 2019-07-23 (×4): qty 1

## 2019-07-23 NOTE — Research (Signed)
Additional documentation for the [employer] 2018-01 study:  Prior to enrollment, patient was referred by Dr. Benay Spice because the patient was eligible for study participation.  Two RNs also verified eligibility prior to enrollment.  Dr. Benay Spice signed eligibility verification sheet on 07/22/2019.  Kit #s collected and submitted: [id number], [id number], [id number], [id number], [id number]   Doreatha Martin, RN, BSN, [facility] 07/23/2019 3:18 PM

## 2019-07-23 NOTE — Progress Notes (Signed)
HEMATOLOGY-ONCOLOGY PROGRESS NOTE  SUBJECTIVE: No pain or nausea this morning.  No new complaint.  Oncology History  Pancreatic adenocarcinoma ([place])  07/10/2019 Initial Diagnosis   Pancreatic adenocarcinoma ([place])   07/21/2019 -  Chemotherapy   The patient had PACLitaxel-protein bound (ABRAXANE) chemo infusion 225 mg, 100 mg/m2 = 225 mg (original dose ), Intravenous,  Once, 0 of 4 cycles Dose modification: 100 mg/m2 (Cycle 1, Reason: Provider Judgment) gemcitabine (GEMZAR) 2,280 mg in sodium chloride 0.9 % 250 mL chemo infusion, 1,000 mg/m2 = 2,280 mg, Intravenous,  Once, 0 of 4 cycles  for chemotherapy treatment.      PHYSICAL EXAMINATION:  Vitals:   07/21/19 2011 07/22/19 0539  BP: (!) 136/102 115/68  Pulse: (!) 104 99  Resp: 16   Temp: (!) 97.3 F (36.3 C) 98.2 F (36.8 C)  SpO2: 97% 97%   Filed Weights   07/21/19 1516  Weight: 238 lb 6.4 oz (108.1 kg)    Intake/Output from previous day: 01/18 0701 - 01/19 0700 In: 847.2 [I.V.:847.2] Out: -   GENERAL:alert, no distress and comfortable EYES: scleral icterus noted OROPHARYNX: white coating on tongue no buccal thrush LUNGS: clear to auscultation and percussion with normal breathing effort HEART: regular rate & rhythm and no murmurs and no lower extremity edema ABDOMEN:abdomen soft, non-tender and normal bowel sounds Musculoskeletal:no cyanosis of digits and no clubbing  NEURO: alert & oriented x 3 with fluent speech, no focal motor/sensory deficits Vascular: Trace pedal edema bilaterally  PAC without erythema or edema  LABORATORY DATA:  I have reviewed the data as listed CMP Latest Ref Rng & Units 07/22/2019 07/21/2019 07/16/2019  Glucose 70 - 99 mg/dL 95 199(H) 137(H)  BUN 8 - 23 mg/dL 8 9 6(L)  Creatinine 0.44 - 1.00 mg/dL <0.30(L) 0.77 0.53  Sodium 135 - 145 mmol/L 139 134(L) 136  Potassium 3.5 - 5.1 mmol/L <2.0(LL) 3.3(L) 3.9  Chloride 98 - 111 mmol/L 118(H) 97(L) 101  CO2 22 - 32 mmol/L 16(L) 26 26  Calcium  8.9 - 10.3 mg/dL 5.4(LL) 9.2 9.0  Total Protein 6.5 - 8.1 g/dL 3.5(L) 6.9 6.6  Total Bilirubin 0.3 - 1.2 mg/dL 14.8(H) 25.9(HH) 17.8(H)  Alkaline Phos 38 - 126 U/L 308(H) 657(H) 496(H)  AST 15 - 41 U/L 81(H) 155(H) 222(H)  ALT 0 - 44 U/L 114(H) 250(H) 337(H)    Lab Results  Component Value Date   WBC 17.5 (H) 07/21/2019   HGB 10.2 (L) 07/21/2019   HCT 29.4 (L) 07/21/2019   MCV 68.5 (L) 07/21/2019   PLT 205 07/21/2019   NEUTROABS 14.7 (H) 07/21/2019    CT CHEST W CONTRAST  Result Date: 07/07/2019 CLINICAL DATA:  Liver mass.  Concern for hepatic metastasis EXAM: CT CHEST WITH CONTRAST TECHNIQUE: Multidetector CT imaging of the chest was performed during intravenous contrast administration. CONTRAST:  31mL OMNIPAQUE IOHEXOL 300 MG/ML  SOLN COMPARISON:  CT 07/02/2019 FINDINGS: Cardiovascular: No significant vascular findings. Normal heart size. No pericardial effusion. Mediastinum/Nodes: No axillary supraclavicular adenopathy. Multiple nodules within the thyroid gland measures 12 mm or less. This is not meet the size requirement for follow-up imaging. No mediastinal lymphadenopathy. Moderate to large hiatal hernia posterior to the LEFT heart. Lungs/Pleura: Linear atelectasis in the lingula and medial RIGHT middle lobe. No suspicious nodularity. Upper Abdomen: Several low-density suspicious lesions in the liver again demonstrated. Inflammation potential mass in the pancreatic head again noted. No interval change from 07/02/2019. Musculoskeletal: No aggressive osseous lesion. IMPRESSION: 1. No evidence of thoracic metastasis. 2.  Low-density lesions in the liver concerning for metastasis. See recent abdomen CT 07/02/2019. 3. Inflammation head of pancreas with concern for underlying neoplasm. Recommend appropriate tumor biomarkers. Electronically Signed   By: Suzy Bouchard M.D.   On: 07/07/2019 08:23   CT ABDOMEN PELVIS W CONTRAST  Result Date: 07/21/2019 CLINICAL DATA:  History of recently  diagnosed metastatic pancreatic cancer. EXAM: CT ABDOMEN AND PELVIS WITH CONTRAST TECHNIQUE: Multidetector CT imaging of the abdomen and pelvis was performed using the standard protocol following bolus administration of intravenous contrast. CONTRAST:  13mL OMNIPAQUE IOHEXOL 300 MG/ML  SOLN COMPARISON:  July 02, 2019 FINDINGS: Lower chest: Mild atelectasis is seen within the anterior aspect of the bilateral lung bases. Hepatobiliary: Numerous heterogeneous low-attenuation liver lesions are seen. These are mildly increased in size when compared to the prior study. Ill-defined gallstones are seen within a contracted gallbladder. Pancreas: A 5.6 cm x 6.3 cm x 5.4 cm complex, partially cystic mass is seen involving the body and head of the pancreas Spleen: Normal in size without focal abnormality. Adrenals/Urinary Tract: Adrenal glands are unremarkable. Kidneys are normal, without renal calculi, focal lesion, or hydronephrosis. Bladder is unremarkable. Stomach/Bowel: There is a large gastric hernia. Appendix appears normal. No evidence of bowel wall thickening, distention, or inflammatory changes. Vascular/Lymphatic: Reproductive: A 12.8 cm x 8.6 cm heterogeneous mass is seen extending from the anterolateral aspect of the uterine fundus on the left. This contains soft tissue and fat density components. Additional 6.8 cm x 6.9 cm, 5.0 cm diameter and 3.7 cm diameter homogeneous uterine mass is seen. Other: Small fat containing ventral hernias are seen along the midline of the lower abdomen. Musculoskeletal: No acute or significant osseous findings. IMPRESSION: 1. 5.6 cm x 6.3 cm x 5.4 cm complex pancreatic mass which is increased in size when compared to the prior study dated July 02, 2019. 2. Numerous heterogeneous liver lesions consistent with hepatic metastasis. 3. Cholelithiasis. 4. Large gastric hernia. 5. Multiple large uterine masses which may represent large uterine fibroids. Electronically Signed   By:  Virgina Norfolk M.D.   On: 07/21/2019 21:34   CT Abdomen Pelvis W Contrast  Result Date: 07/02/2019 CLINICAL DATA:  Abdominal pain with nausea and vomiting EXAM: CT ABDOMEN AND PELVIS WITH CONTRAST TECHNIQUE: Multidetector CT imaging of the abdomen and pelvis was performed using the standard protocol following bolus administration of intravenous contrast. CONTRAST:  110mL OMNIPAQUE IOHEXOL 300 MG/ML  SOLN COMPARISON:  CT 04/26/2004 FINDINGS: Lower chest: Lung bases demonstrate subsegmental atelectasis at the lingula, right middle lobe and inferior right upper lobe. No acute consolidation or pleural effusion. The heart size is within normal limits. Irregular moderate hiatal hernia with wall thickening. This is increased compared to prior. Hepatobiliary: Scattered hypodense liver lesions, some of which were present on comparison study. Multiple new ill-defined hypodense liver masses concerning for metastatic disease. Largest suspicious lesion is seen in the right hepatic dome and measures 2.5 cm. Multiple calcified gallstones. No biliary dilatation. Pancreas: Generalized inflammatory changes about the pancreas. Development of mild ductal dilatation. Ill-defined hypodense mass at the head of the pancreas measuring approximately 3.4 cm transverse by 2.2 cm AP by 3 cm craniocaudad. Spleen: Normal in size without focal abnormality. Adrenals/Urinary Tract: Adrenal glands are unremarkable. Kidneys are normal, without renal calculi, focal lesion, or hydronephrosis. Bladder is unremarkable. Stomach/Bowel: No dilated small bowel. Negative for colon wall thickening. Negative appendix. Vascular/Lymphatic: Nonaneurysmal aorta. Hazy appearance of mesenteric root with subcentimeter nodes. Multiple small nodes or punctate nodules in the  right lower quadrant/right gutter. Hypoenhancement of the distal splenic vein and superior mesenteric vein. Reproductive: Markedly enlarged uterus with multiple masses, increased compared to  prior. Other: Negative for free air or significant ascites. Anterior abdominal wall laxity. Small supraumbilical fat containing ventral hernia Musculoskeletal: No acute or suspicious osseous abnormality. IMPRESSION: 1. Generalized pancreatic inflammation. There is an ill-defined hypodense suspicious mass at the head of the pancreas measuring 3.4 x 2.2 x 3 cm. 2. Multiple ill-defined hypodense liver masses, consistent with metastatic disease. 3. Hypoenhancement of the splenic vein and superior mesenteric veins near their confluence, potentially occluded. 4. Multiple punctate nodules or small nodes in the right lower quadrant/gutter, cannot exclude metastatic disease 5. Moderate irregular hiatal hernia with some wall thickening, could be correlated with endoscopy 6. Bulky enlargement of the uterus with multiple large masses, increased compared to prior. Electronically Signed   By: Donavan Foil M.D.   On: 07/02/2019 22:17   US BIOPSY (LIVER)  Result Date: 07/07/2019 INDICATION: Concern for metastatic pancreatic cancer. Please perform ultrasound-guided liver lesion biopsy for tissue diagnostic purposes. EXAM: ULTRASOUND GUIDED LIVER LESION BIOPSY COMPARISON:  CT abdomen and pelvis-07/02/2019 MEDICATIONS: None ANESTHESIA/SEDATION: Fentanyl 100 mcg IV; Versed 2 mg IV Total Moderate Sedation time:  17 Minutes. The patient's level of consciousness and vital signs were monitored continuously by radiology nursing throughout the procedure under my direct supervision. COMPLICATIONS: None immediate. PROCEDURE: Informed written consent was obtained from the patient after a discussion of the risks, benefits and alternatives to treatment. The patient understands and consents the procedure. A timeout was performed prior to the initiation of the procedure. Ultrasound scanning was performed of the right upper abdominal quadrant demonstrates an ill-defined approximately 2.9 x 3.1 x 2.2 cm hypoechoic nodule/mass within the dome of  the right lobe of the liver correlating with the indeterminate mass seen on preceding abdominal CT image 15, series 3. Note, imaging was degraded secondary to patient body habitus well as well as the lesion being located within the dome of the liver with associated interposition of lung. The procedure was planned. The right upper abdominal quadrant was prepped and draped in the usual sterile fashion. The overlying soft tissues were anesthetized with 1% lidocaine with epinephrine. A 17 gauge, 6.8 cm co-axial needle was advanced into a peripheral aspect of the lesion. This was followed by 6 core biopsies with an 18 gauge core device under direct ultrasound guidance. The coaxial needle tract was embolized with a small amount of Gel-Foam slurry and superficial hemostasis was obtained with manual compression. Post procedural scanning was negative for definitive area of hemorrhage or additional complication. A dressing was placed. The patient tolerated the procedure well without immediate post procedural complication. IMPRESSION: Technically challenging though ultimately successful ultrasound guided core needle biopsy of indeterminate mass within the dome of the right lobe of the liver. Electronically Signed   By: Sandi Mariscal M.D.   On: 07/07/2019 13:34   DG Abd 2 Views  Result Date: 07/08/2019 CLINICAL DATA:  Abdominal pain and distension x3 days. EXAM: ABDOMEN - 2 VIEW COMPARISON:  None. FINDINGS: The bowel gas pattern is normal. A moderate to marked amount of stool is seen within the ascending colon. There is no evidence of free air. No radio-opaque calculi or other significant radiographic abnormality is seen. A radiopaque tubal ligation clip is seen within the pelvis on the right. An additional tubal ligation clip is seen overlying the superior aspect of the sacrum on the left. A 4 mm focal opacity is  seen overlying the symphysis pubis on the left. IMPRESSION: 1. Normal bowel gas pattern. No evidence of free air.  Electronically Signed   By: Virgina Norfolk M.D.   On: 07/08/2019 16:27   DG Abd Portable 1V  Result Date: 07/10/2019 CLINICAL DATA:  Abdominal pain EXAM: PORTABLE ABDOMEN - 1 VIEW COMPARISON:  07/08/2019 FINDINGS: Bowel gas pattern remains unremarkable. Stool burden is overall mild with greater burden within the ascending colon. Tubal ligation clips noted. IMPRESSION: Normal bowel gas pattern. Electronically Signed   By: Macy Mis M.D.   On: 07/10/2019 13:20   IR IMAGING GUIDED PORT INSERTION  Result Date: 07/14/2019 CLINICAL DATA:  Pancreatic carcinoma, needs poor for planned chemotherapy regimen EXAM: TUNNELED PORT CATHETER PLACEMENT WITH ULTRASOUND AND FLUOROSCOPIC GUIDANCE FLUOROSCOPY TIME:  0.6 minute; 52  uGym2 DAP ANESTHESIA/SEDATION: Intravenous Fentanyl 35mcg and Versed 2mg  were administered as conscious sedation during continuous monitoring of the patient's level of consciousness and physiological / cardiorespiratory status by the radiology RN, with a total moderate sedation time of 21 minutes. TECHNIQUE: The procedure, risks, benefits, and alternatives were explained to the patient. Questions regarding the procedure were encouraged and answered. The patient understands and consents to the procedure. As antibiotic prophylaxis, cefazolin 2 g was ordered pre-procedure and administered intravenously within one hour of incision. Patency of the right IJ vein was confirmed with ultrasound with image documentation. An appropriate skin site was determined. Skin site was marked. Region was prepped using maximum barrier technique including cap and mask, sterile gown, sterile gloves, large sterile sheet, and Chlorhexidine as cutaneous antisepsis. The region was infiltrated locally with 1% lidocaine. Under real-time ultrasound guidance, the right IJ vein was accessed with a 21 gauge micropuncture needle; the needle tip within the vein was confirmed with ultrasound image documentation. Needle was  exchanged over a 018 guidewire for transitional dilator, and vascular measurement was performed. A small incision was made on the right anterior chest wall and a subcutaneous pocket fashioned. The power-injectable port was positioned and its catheter tunneled to the right IJ dermatotomy site. The transitional dilator was exchanged over an Amplatz wire for a peel-away sheath, through which the port catheter, which had been trimmed to the appropriate length, was advanced and positioned under fluoroscopy with its tip at the cavoatrial junction. Spot chest radiograph confirms good catheter position and no pneumothorax. The port was flushed per protocol. The pocket was closed with deep interrupted and subcuticular continuous 3-0 Monocryl sutures. The incisions were covered with Dermabond then covered with a sterile dressing. The patient tolerated the procedure well. COMPLICATIONS: COMPLICATIONS None immediate IMPRESSION: Technically successful right IJ power-injectable port catheter placement. Ready for routine use. Electronically Signed   By: Lucrezia Europe M.D.   On: 07/14/2019 13:39    ASSESSMENT AND PLAN: 1. Metastatic pancreatic adenocarcinoma -CT of the abdomen pelvis on 07/02/2019 showed a 3.4 x 2.2 x 3  cm mass in the head of the pancreas with multiple ill-defined  hypodense liver masses. -CT of the chest on 07/07/2019 showed no evidence of thoracic  metastasis. -Ultrasound guided liver biopsy on 07/07/2019 demonstrated  metastatic pancreatic adenocarcinoma. -Elevated CA 19-9 2. 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 Chronicity, now with mild microcytic anemia 4. Hypokalemia-continue to replete IV/p.o. 5. Uterine fibroids 6. Abdominal pain secondary to #1 7. Nausea 8.  Hyperbilirubinemia, likely due to biliary  obstruction  Ms. Samaras appears stable.  The hyperbilirubinemia persist.  Her case was presented at the  GI tumor conference this morning.  The plan is to proceed with an MRI/MRCP today.  The hope is to better define the tumor burden and look for evidence of biliary obstruction.  She is being followed by Dr. Carlean Purl.  1.  MRI/MRCP today 2.  Replete potassium

## 2019-07-23 NOTE — Progress Notes (Signed)
This chaplain responded to spiritual care referral for Pt. Rachel Vasquez from [place].  The chaplain introduced herself to the Pt. and briefly explained the purpose of an Rachel Vasquez.  The Pt. requested the document be left bedside.  The chaplain shared with the Pt. how to contact spiritual care through the RN for questions and F/U.

## 2019-07-23 NOTE — Progress Notes (Addendum)
[facility] Progress Note  CC:  Jaundice, pancreatic cancer  Subjective:  MRCP results discussed with the patient and her sister.  Discussed ERCP with stent.    Objective:  Vital signs in last 24 hours: Temp:  [98.4 F (36.9 C)-99.6 F (37.6 C)] 98.6 F (37 C) (01/20 1318) Pulse Rate:  [95-110] 104 (01/20 1318) Resp:  [17-20] 20 (01/20 1318) BP: (118-140)/(77-92) 135/86 (01/20 1318) SpO2:  [92 %-98 %] 92 % (01/20 1318) Last BM Date: 07/22/19 General:  Alert, deep jaundice noted, in NAD  Lab Results: Recent Labs    07/21/19 0848  WBC 17.5*  HGB 10.2*  HCT 29.4*  PLT 205   BMET Recent Labs    07/22/19 0609 07/22/19 0817 07/23/19 0500  NA 139 131* 134*  K <2.0* 3.4* 3.2*  CL 118* 96* 100  CO2 16* 25 23  GLUCOSE 95 155* 166*  BUN 8 12 8   CREATININE <0.30* <0.30* <0.30*  CALCIUM 5.4* 9.0 9.0   LFT Recent Labs    07/23/19 0500  PROT 6.4*  ALBUMIN 2.1*  AST 152*  ALT 198*  ALKPHOS 567*  BILITOT 28.0*   CT ABDOMEN PELVIS W CONTRAST  Result Date: 07/21/2019 CLINICAL DATA:  History of recently diagnosed metastatic pancreatic cancer. EXAM: CT ABDOMEN AND PELVIS WITH CONTRAST TECHNIQUE: Multidetector CT imaging of the abdomen and pelvis was performed using the standard protocol following bolus administration of intravenous contrast. CONTRAST:  133mL OMNIPAQUE IOHEXOL 300 MG/ML  SOLN COMPARISON:  July 02, 2019 FINDINGS: Lower chest: Mild atelectasis is seen within the anterior aspect of the bilateral lung bases. Hepatobiliary: Numerous heterogeneous low-attenuation liver lesions are seen. These are mildly increased in size when compared to the prior study. Ill-defined gallstones are seen within a contracted gallbladder. Pancreas: A 5.6 cm x 6.3 cm x 5.4 cm complex, partially cystic mass is seen involving the body and head of the pancreas Spleen: Normal in size without focal abnormality. Adrenals/Urinary Tract: Adrenal glands are unremarkable.  Kidneys are normal, without renal calculi, focal lesion, or hydronephrosis. Bladder is unremarkable. Stomach/Bowel: There is a large gastric hernia. Appendix appears normal. No evidence of bowel wall thickening, distention, or inflammatory changes. Vascular/Lymphatic: Reproductive: A 12.8 cm x 8.6 cm heterogeneous mass is seen extending from the anterolateral aspect of the uterine fundus on the left. This contains soft tissue and fat density components. Additional 6.8 cm x 6.9 cm, 5.0 cm diameter and 3.7 cm diameter homogeneous uterine mass is seen. Other: Small fat containing ventral hernias are seen along the midline of the lower abdomen. Musculoskeletal: No acute or significant osseous findings. IMPRESSION: 1. 5.6 cm x 6.3 cm x 5.4 cm complex pancreatic mass which is increased in size when compared to the prior study dated July 02, 2019. 2. Numerous heterogeneous liver lesions consistent with hepatic metastasis. 3. Cholelithiasis. 4. Large gastric hernia. 5. Multiple large uterine masses which may represent large uterine fibroids. Electronically Signed   By: Virgina Norfolk M.D.   On: 07/21/2019 21:34   MR 3D Recon At Scanner  Result Date: 07/23/2019 CLINICAL DATA:  Inpatient. Pancreatic cancer with biopsy-proven liver metastases based on 07/07/2019 liver mass biopsy, now with hyperbilirubinemia. Chemotherapy initiated 07/21/2019. EXAM: MRI ABDOMEN WITHOUT AND WITH CONTRAST (INCLUDING MRCP) TECHNIQUE: Multiplanar multisequence MR imaging of the abdomen was performed both before and after the administration of intravenous contrast. Heavily T2-weighted images of the biliary and pancreatic ducts were obtained, and three-dimensional MRCP images were rendered by post processing. CONTRAST:  62mL GADAVIST GADOBUTROL 1 MMOL/ML IV SOLN COMPARISON:  07/21/2019 CT abdomen/pelvis. FINDINGS: Lower chest: No acute abnormality at the lung bases. Hepatobiliary: There are numerous (greater than 15) similar liver masses  scattered throughout the liver each demonstrating mild T2 hyperintensity, restricted diffusion and heterogeneous enhancement, compatible with liver metastases. Representative liver masses measure 5.1 x 3.8 cm in the segment 2 left liver lobe (series 3/image 19), 5.3 x 4.3 cm in the segment 8 right liver lobe (series 3/image 17) and 1.8 x 1.6 cm in the caudate lobe (series 3/image 21). Additional scattered simple liver cysts, largest 2.3 cm in the posterior right liver lobe. No hepatic steatosis. Nondistended gallbladder contains numerous gallstones, largest 10 mm. No definite gallbladder wall thickening. No significant pericholecystic fluid. Mild-to-moderate diffuse intrahepatic biliary ductal dilatation. Prominent proximal common bile duct dilation up to 18 mm diameter, with abrupt CBD caliber transition at the level of the pancreatic head. No choledocholithiasis. No beading of the intrahepatic bile ducts. Pancreas: Poorly marginated pancreatic head mass measuring approximately 5.3 x 4.9 cm (series 3/image 39) with mixed T1 and T2 signal intensity and thick irregular peripheral enhancement with central necrosis. Dilated main pancreatic duct up to 6 mm diameter. Spleen: Normal size. No mass. Adrenals/Urinary Tract: Normal adrenals. No hydronephrosis. Normal kidneys with no renal mass. Stomach/Bowel: Moderate hiatal hernia. Otherwise normal nondistended stomach. Visualized small and large bowel is normal caliber, with no bowel wall thickening. Vascular/Lymphatic: Normal caliber abdominal aorta. Occluded portal splenic venous confluence with nonocclusive bland appearing thrombosis of the SMV (series 1103/image 60). Nonocclusive bland appearing thrombosis of main portal vein near the bifurcation with near occlusive thrombosis of the right portal vein. Patent left portal vein. Patent renal and hepatic veins and IVC. No pathologically enlarged lymph nodes in the abdomen. Other: No abdominal ascites or focal fluid  collection. Musculoskeletal: No aggressive appearing focal osseous lesions. IMPRESSION: 1. Poorly marginated heterogeneously enhancing 5.3 cm pancreatic head mass compatible with known primary pancreatic malignancy. 2. 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 Mild-to-moderate diffuse intrahepatic biliary ductal dilatation. 3. 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 Near-occlusive bland appearing thrombosis of the right portal vein. 4. Widespread liver metastases. 5. Moderate hiatal hernia. Electronically Signed   By: Ilona Sorrel M.D.   On: 07/23/2019 14:09   MR ABDOMEN MRCP W WO CONTAST  Result Date: 07/23/2019 CLINICAL DATA:  Inpatient. Pancreatic cancer with biopsy-proven liver metastases based on 07/07/2019 liver mass biopsy, now with hyperbilirubinemia. Chemotherapy initiated 07/21/2019. EXAM: MRI ABDOMEN WITHOUT AND WITH CONTRAST (INCLUDING MRCP) TECHNIQUE: Multiplanar multisequence MR imaging of the abdomen was performed both before and after the administration of intravenous contrast. Heavily T2-weighted images of the biliary and pancreatic ducts were obtained, and three-dimensional MRCP images were rendered by post processing. CONTRAST:  24mL GADAVIST GADOBUTROL 1 MMOL/ML IV SOLN COMPARISON:  07/21/2019 CT abdomen/pelvis. FINDINGS: Lower chest: No acute abnormality at the lung bases. Hepatobiliary: There are numerous (greater than 15) similar liver masses scattered throughout the liver each demonstrating mild T2 hyperintensity, restricted diffusion and heterogeneous enhancement, compatible with liver metastases. Representative liver masses measure 5.1 x 3.8 cm in the segment 2 left liver lobe (series 3/image 19), 5.3 x 4.3 cm in the segment 8 right liver lobe (series 3/image 17) and 1.8 x 1.6 cm in the caudate lobe  (series 3/image 21). Additional scattered simple liver cysts,  largest 2.3 cm in the posterior right liver lobe. No hepatic steatosis. Nondistended gallbladder contains numerous gallstones, largest 10 mm. No definite gallbladder wall thickening. No significant pericholecystic fluid. Mild-to-moderate diffuse intrahepatic biliary ductal dilatation. Prominent proximal common bile duct dilation up to 18 mm diameter, with abrupt CBD caliber transition at the level of the pancreatic head. No choledocholithiasis. No beading of the intrahepatic bile ducts. Pancreas: Poorly marginated pancreatic head mass measuring approximately 5.3 x 4.9 cm (series 3/image 39) with mixed T1 and T2 signal intensity and thick irregular peripheral enhancement with central necrosis. Dilated main pancreatic duct up to 6 mm diameter. Spleen: Normal size. No mass. Adrenals/Urinary Tract: Normal adrenals. No hydronephrosis. Normal kidneys with no renal mass. Stomach/Bowel: Moderate hiatal hernia. Otherwise normal nondistended stomach. Visualized small and large bowel is normal caliber, with no bowel wall thickening. Vascular/Lymphatic: Normal caliber abdominal aorta. Occluded portal splenic venous confluence with nonocclusive bland appearing thrombosis of the SMV (series 1103/image 60). Nonocclusive bland appearing thrombosis of main portal vein near the bifurcation with near occlusive thrombosis of the right portal vein. Patent left portal vein. Patent renal and hepatic veins and IVC. No pathologically enlarged lymph nodes in the abdomen. Other: No abdominal ascites or focal fluid collection. Musculoskeletal: No aggressive appearing focal osseous lesions. IMPRESSION: 1. Poorly marginated heterogeneously enhancing 5.3 cm pancreatic head mass compatible with known primary pancreatic malignancy. 2. 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 Mild-to-moderate diffuse  intrahepatic biliary ductal dilatation. 3. 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 Near-occlusive bland appearing thrombosis of the right portal vein. 4. Widespread liver metastases. 5. Moderate hiatal hernia. Electronically Signed   By: Ilona Sorrel M.D.   On: 07/23/2019 14:09   Assessment / Plan: 11. 64 year old female with recently diagnosed pancreatic adenocarcinoma.  She was to start Chemotherapy 1/18 but instead admitted by Oncology for vomiting and progressive rise in bilirubin. She says her abdominal pain is controlled with home oral pain meds.  MRCP today showed 18 mm CBD with malignant stricture. -Plan for ERCP with stent with Dr. Carlean Purl tomorrow, 1/21.  I have discontinued her Lovenox so that she does not receive dose this evening.  She is NPO after midnight.  Peri-procedure abx ordered as well as indomethacin supp.  Discussed ERCP with patient and her sister who was at bedside.  2. Leukocytosis. Afebrile. Cholangitis unusual in malignant biliary obstructions so abx not initiated.  CBC not rechecked in 2 days. -follow up CBC in am.   3. Hypokalemia with K+ 3.2 today, repletion in progress  4. Microcytic anemia, CBC not rechecked for 2 days -follow-up AM CBC   LOS: 2 days   Laban Emperor. Zehr  07/23/2019, 4:03 PM  Agree with Ms. Alphia Kava management.  Gatha Mayer, MD, [facility]

## 2019-07-24 ENCOUNTER — Inpatient Hospital Stay (HOSPITAL_COMMUNITY): Payer: BC Managed Care – PPO | Admitting: Anesthesiology

## 2019-07-24 ENCOUNTER — Inpatient Hospital Stay (HOSPITAL_COMMUNITY): Payer: BC Managed Care – PPO

## 2019-07-24 ENCOUNTER — Encounter (HOSPITAL_COMMUNITY): Payer: Self-pay | Admitting: Oncology

## 2019-07-24 ENCOUNTER — Encounter (HOSPITAL_COMMUNITY)
Admission: AD | Disposition: A | Discharge status: Home / Self Care | Payer: Self-pay | Source: Ambulatory Visit | Attending: Oncology

## 2019-07-24 HISTORY — PX: ERCP: SHX5425

## 2019-07-24 LAB — COMPREHENSIVE METABOLIC PANEL
ALT: 183 U/L — ABNORMAL HIGH (ref 0–44)
AST: 137 U/L — ABNORMAL HIGH (ref 15–41)
Albumin: 2 g/dL — ABNORMAL LOW (ref 3.5–5.0)
Alkaline Phosphatase: 564 U/L — ABNORMAL HIGH (ref 38–126)
Anion gap: 10 (ref 5–15)
BUN: 6 mg/dL — ABNORMAL LOW (ref 8–23)
CO2: 25 mmol/L (ref 22–32)
Calcium: 9.2 mg/dL (ref 8.9–10.3)
Chloride: 102 mmol/L (ref 98–111)
Creatinine, Ser: <0.3 mg/dL — ABNORMAL LOW (ref 0.44–1.00)
Glucose, Bld: 142 mg/dL — ABNORMAL HIGH (ref 70–99)
Potassium: 3 mmol/L — ABNORMAL LOW (ref 3.5–5.1)
Sodium: 137 mmol/L (ref 135–145)
Total Bilirubin: 28.2 mg/dL (ref 0.3–1.2)
Total Protein: 6.2 g/dL — ABNORMAL LOW (ref 6.5–8.1)

## 2019-07-24 LAB — CBC
HCT: 24.6 % — ABNORMAL LOW (ref 36.0–46.0)
Hemoglobin: 8.1 g/dL — ABNORMAL LOW (ref 12.0–15.0)
MCH: 23.8 pg — ABNORMAL LOW (ref 26.0–34.0)
MCHC: 32.9 g/dL (ref 30.0–36.0)
MCV: 72.1 fL — ABNORMAL LOW (ref 80.0–100.0)
Platelets: 205 10*3/uL (ref 150–400)
RBC: 3.41 MIL/uL — ABNORMAL LOW (ref 3.87–5.11)
RDW: 25.1 % — ABNORMAL HIGH (ref 11.5–15.5)
WBC: 16.1 10*3/uL — ABNORMAL HIGH (ref 4.0–10.5)
nRBC: 0.7 % — ABNORMAL HIGH (ref 0.0–0.2)

## 2019-07-24 SURGERY — ERCP, WITH INTERVENTION IF INDICATED
Anesthesia: General

## 2019-07-24 MED ORDER — PROPOFOL 10 MG/ML IV BOLUS
INTRAVENOUS | Status: AC
  Filled 2019-07-24: qty 20

## 2019-07-24 MED ORDER — SODIUM CHLORIDE 0.9 % IV SOLN
1.5000 g | Freq: Once | INTRAVENOUS | Status: DC
  Filled 2019-07-24: qty 4

## 2019-07-24 MED ORDER — INDOMETHACIN 50 MG RE SUPP: 100.0000 mg | Freq: Once | RECTAL | Status: DC

## 2019-07-24 MED ORDER — GLUCAGON HCL RDNA (DIAGNOSTIC) 1 MG IJ SOLR
INTRAMUSCULAR | Status: AC
  Filled 2019-07-24: qty 2

## 2019-07-24 MED ORDER — LACTATED RINGERS IV SOLN: INTRAVENOUS | Status: DC | PRN

## 2019-07-24 MED ORDER — SODIUM CHLORIDE 0.9 % IV SOLN
INTRAVENOUS | Status: DC
  Filled 2019-07-24 (×2): qty 1000

## 2019-07-24 MED ORDER — SODIUM CHLORIDE 0.9 % IV SOLN: INTRAVENOUS | Status: DC

## 2019-07-24 MED ORDER — FENTANYL CITRATE (PF) 100 MCG/2ML IJ SOLN
INTRAMUSCULAR | Status: AC
  Filled 2019-07-24: qty 2

## 2019-07-24 MED ORDER — PROPOFOL 10 MG/ML IV BOLUS
INTRAVENOUS | Status: DC | PRN
  Administered 2019-07-24: 150 mg via INTRAVENOUS

## 2019-07-24 MED ORDER — DEXAMETHASONE SODIUM PHOSPHATE 10 MG/ML IJ SOLN
INTRAMUSCULAR | Status: DC | PRN
  Administered 2019-07-24: 5 mg via INTRAVENOUS

## 2019-07-24 MED ORDER — ONDANSETRON HCL 4 MG/2ML IJ SOLN
INTRAMUSCULAR | Status: DC | PRN
  Administered 2019-07-24: 4 mg via INTRAVENOUS

## 2019-07-24 MED ORDER — INDOMETHACIN 50 MG RE SUPP
RECTAL | Status: AC
  Filled 2019-07-24: qty 2

## 2019-07-24 MED ORDER — SUCCINYLCHOLINE CHLORIDE 200 MG/10ML IV SOSY
PREFILLED_SYRINGE | INTRAVENOUS | Status: DC | PRN
  Administered 2019-07-24: 100 mg via INTRAVENOUS

## 2019-07-24 MED ORDER — FENTANYL CITRATE (PF) 250 MCG/5ML IJ SOLN
INTRAMUSCULAR | Status: DC | PRN
  Administered 2019-07-24: 100 ug via INTRAVENOUS
  Administered 2019-07-24 (×2): 50 ug via INTRAVENOUS

## 2019-07-24 MED ORDER — LIDOCAINE 2% (20 MG/ML) 5 ML SYRINGE
INTRAMUSCULAR | Status: DC | PRN
  Administered 2019-07-24: 75 mg via INTRAVENOUS

## 2019-07-24 NOTE — Anesthesia Procedure Notes (Signed)
Procedure Name: Intubation Date/Time: 07/24/2019 11:38 AM Performed by: Lollie Sails, CRNA Pre-anesthesia Checklist: Patient identified, Emergency Drugs available, Suction available, Patient being monitored and Timeout performed Patient Re-evaluated:Patient Re-evaluated prior to induction Oxygen Delivery Method: Circle system utilized Preoxygenation: Pre-oxygenation with 100% oxygen Induction Type: IV induction Ventilation: Mask ventilation without difficulty Laryngoscope Size: Miller and 3 Grade View: Grade I Tube type: Oral Tube size: 7.5 mm Number of attempts: 1 Airway Equipment and Method: Stylet Placement Confirmation: ETT inserted through vocal cords under direct vision,  positive ETCO2 and breath sounds checked- equal and bilateral Secured at: 22 cm Tube secured with: Tape Dental Injury: Teeth and Oropharynx as per pre-operative assessment

## 2019-07-24 NOTE — Consult Note (Signed)
Chief Complaint: Patient was seen in consultation today for biliary obstruction/biliary drain placement.  Referring Physician(s): Gatha Mayer  Supervising Physician: Markus Daft  Patient Status: [facility] - In-pt  History of Present Illness: Rachel Vasquez is a 64 y.o. female with a past medical history of GERD, hiatal hernia, and pancreatic cancer. She was unfortunately diagnosed with pancreatic cancer in 07/2019. Her cancer is managed by Dr. Benay Spice. She was directly admitted to [facility] by Dr. Benay Spice 07/21/2019 for management of significantly elevated bilirubin. GI was consulted and patient underwent ERCP with plans for stent placement in OR today with Dr. Carlean Purl. Unfortunately, unable to pass obstruction, so stent was not placed.  IR consulted by Dr. Carlean Purl for possible image-guided PTC with internal/external biliary drain placement. Patient laying in bed resting comfortably. She is somnolent s/p ERCP. Accompanied by sister at bedside.   Past Medical History:  Diagnosis Date  . GERD (gastroesophageal reflux disease)   . Hiatal hernia   . Liver lesion   . Medical history non-contributory   . Pancreatic mass     Past Surgical History:  Procedure Laterality Date  . IR IMAGING GUIDED PORT INSERTION  07/14/2019    Allergies: Patient has no known allergies.  Medications: Prior to Admission medications   Medication Sig Start Date End Date Taking? Authorizing Provider  amLODipine (NORVASC) 10 MG tablet Take 1 tablet (10 mg total) by mouth daily. 07/18/19 09/16/19 Yes Antonieta Pert, MD  lidocaine-prilocaine (EMLA) cream Apply 1 application topically as needed. Apply 1 hour prior to stick and cover with plastic wrap 07/16/19  Yes Ladell Pier, MD  LORazepam (ATIVAN) 0.5 MG tablet Take 1 tablet (0.5 mg total) by mouth every 6 (six) hours as needed for up to 15 doses (nausea/vomiting. May give PO or SL.). 07/17/19  Yes Kc, Maren Beach, MD  metoCLOPramide (REGLAN) 5 MG tablet Take 1 tablet (5  mg total) by mouth 3 (three) times daily before meals. 07/17/19 08/16/19 Yes Antonieta Pert, MD  Multiple Vitamins-Minerals (MULTIVITAMIN WITH MINERALS) tablet Take 1 tablet by mouth daily.   Yes [provider]  ondansetron (ZOFRAN-ODT) 4 MG disintegrating tablet Take 1 tablet (4 mg total) by mouth every 8 (eight) hours as needed for up to 30 doses for nausea or vomiting. 07/17/19  Yes Antonieta Pert, MD  oxyCODONE (OXY IR/ROXICODONE) 5 MG immediate release tablet Take 1 tablet (5 mg total) by mouth every 4 (four) hours as needed for up to 15 doses for moderate pain. 07/17/19  Yes Antonieta Pert, MD  oxyCODONE (OXYCONTIN) 10 mg 12 hr tablet Take 1 tablet (10 mg total) by mouth at bedtime for 7 doses. 07/17/19 07/24/19 Yes Antonieta Pert, MD  oxyCODONE (OXYCONTIN) 20 mg 12 hr tablet Take 1 tablet (20 mg total) by mouth every morning for 7 doses. 07/18/19 07/25/19 Yes Antonieta Pert, MD  pantoprazole (PROTONIX) 40 MG tablet Take 1 tablet (40 mg total) by mouth daily. 07/17/19 08/16/19 Yes Antonieta Pert, MD  polyethylene glycol (MIRALAX / GLYCOLAX) 17 g packet Take 17 g by mouth daily as needed for mild constipation. 07/17/19  Yes Antonieta Pert, MD  potassium chloride SA (KLOR-CON) 20 MEQ tablet Take 1 tablet (20 mEq total) by mouth daily for 15 days. 07/17/19 08/01/19 Yes Antonieta Pert, MD  predniSONE (DELTASONE) 10 MG tablet Take 1 tablet (10 mg total) by mouth daily with breakfast. 07/18/19 08/17/19 Yes Kc, Maren Beach, MD  prochlorperazine (COMPAZINE) 10 MG tablet Take 1 tablet (10 mg total) by mouth every 6 (six) hours  as needed for nausea. 07/16/19  Yes Ladell Pier, MD     Family History  Problem Relation Age of Onset  . Pancreatic cancer Father   . Lung cancer Paternal Uncle     Social History   Socioeconomic History  . Marital status: Single    Spouse name: Not on file  . Number of children: 2  . Years of education: Not on file  . Highest education level: Not on file  Occupational History  . Not on file  Tobacco Use    . Smoking status: Never Smoker  . Smokeless tobacco: Never Used  Substance and Sexual Activity  . Alcohol use: Not Currently  . Drug use: Never  . Sexual activity: Yes  Other Topics Concern  . Not on file  Social History Narrative   Single   2 kids   Social Determinants of Health   Financial Resource Strain:   . Difficulty of Paying Living Expenses: Not on file  Food Insecurity:   . Worried About Charity fundraiser in the Last Year: Not on file  . Ran Out of Food in the Last Year: Not on file  Transportation Needs:   . Lack of Transportation (Medical): Not on file  . Lack of Transportation (Non-Medical): Not on file  Physical Activity:   . Days of Exercise per Week: Not on file  . Minutes of Exercise per Session: Not on file  Stress:   . Feeling of Stress : Not on file  Social Connections:   . Frequency of Communication with Friends and Family: Not on file  . Frequency of Social Gatherings with Friends and Family: Not on file  . Attends Religious Services: Not on file  . Active Member of Clubs or Organizations: Not on file  . Attends Archivist Meetings: Not on file  . Marital Status: Not on file     Review of Systems: A 12 point ROS discussed and pertinent positives are indicated in the HPI above.  All other systems are negative.  Review of Systems  Unable to perform ROS: Other (Somnolent)    Vital Signs: BP 124/69 (BP Location: Left Wrist)   Pulse 88   Temp 97.7 F (36.5 C) (Oral)   Resp 18   Ht 5' 7"  (1.702 m)   Wt 238 lb 1.6 oz (108 kg)   SpO2 96%   BMI 37.29 kg/m   Physical Exam Vitals and nursing note reviewed.  Constitutional:      General: She is not in acute distress. Cardiovascular:     Rate and Rhythm: Normal rate and regular rhythm.     Heart sounds: Normal heart sounds. No murmur.  Pulmonary:     Effort: Pulmonary effort is normal. No respiratory distress.     Breath sounds: Normal breath sounds.  Skin:    General: Skin is  warm and dry.  Neurological:     Mental Status: She is oriented to person, place, and time.      MD Evaluation Airway: WNL Heart: WNL Abdomen: WNL Chest/ Lungs: WNL ASA  Classification: 3 Mallampati/Airway Score: Two   Imaging: CT CHEST W CONTRAST  Result Date: 07/07/2019 CLINICAL DATA:  Liver mass.  Concern for hepatic metastasis EXAM: CT CHEST WITH CONTRAST TECHNIQUE: Multidetector CT imaging of the chest was performed during intravenous contrast administration. CONTRAST:  52m OMNIPAQUE IOHEXOL 300 MG/ML  SOLN COMPARISON:  CT 07/02/2019 FINDINGS: Cardiovascular: No significant vascular findings. Normal heart size. No pericardial effusion. Mediastinum/Nodes: No  axillary supraclavicular adenopathy. Multiple nodules within the thyroid gland measures 12 mm or less. This is not meet the size requirement for follow-up imaging. No mediastinal lymphadenopathy. Moderate to large hiatal hernia posterior to the LEFT heart. Lungs/Pleura: Linear atelectasis in the lingula and medial RIGHT middle lobe. No suspicious nodularity. Upper Abdomen: Several low-density suspicious lesions in the liver again demonstrated. Inflammation potential mass in the pancreatic head again noted. No interval change from 07/02/2019. Musculoskeletal: No aggressive osseous lesion. IMPRESSION: 1. No evidence of thoracic metastasis. 2. Low-density lesions in the liver concerning for metastasis. See recent abdomen CT 07/02/2019. 3. Inflammation head of pancreas with concern for underlying neoplasm. Recommend appropriate tumor biomarkers. Electronically Signed   By: Suzy Bouchard M.D.   On: 07/07/2019 08:23   CT ABDOMEN PELVIS W CONTRAST  Result Date: 07/21/2019 CLINICAL DATA:  History of recently diagnosed metastatic pancreatic cancer. EXAM: CT ABDOMEN AND PELVIS WITH CONTRAST TECHNIQUE: Multidetector CT imaging of the abdomen and pelvis was performed using the standard protocol following bolus administration of intravenous  contrast. CONTRAST:  131m OMNIPAQUE IOHEXOL 300 MG/ML  SOLN COMPARISON:  July 02, 2019 FINDINGS: Lower chest: Mild atelectasis is seen within the anterior aspect of the bilateral lung bases. Hepatobiliary: Numerous heterogeneous low-attenuation liver lesions are seen. These are mildly increased in size when compared to the prior study. Ill-defined gallstones are seen within a contracted gallbladder. Pancreas: A 5.6 cm x 6.3 cm x 5.4 cm complex, partially cystic mass is seen involving the body and head of the pancreas Spleen: Normal in size without focal abnormality. Adrenals/Urinary Tract: Adrenal glands are unremarkable. Kidneys are normal, without renal calculi, focal lesion, or hydronephrosis. Bladder is unremarkable. Stomach/Bowel: There is a large gastric hernia. Appendix appears normal. No evidence of bowel wall thickening, distention, or inflammatory changes. Vascular/Lymphatic: Reproductive: A 12.8 cm x 8.6 cm heterogeneous mass is seen extending from the anterolateral aspect of the uterine fundus on the left. This contains soft tissue and fat density components. Additional 6.8 cm x 6.9 cm, 5.0 cm diameter and 3.7 cm diameter homogeneous uterine mass is seen. Other: Small fat containing ventral hernias are seen along the midline of the lower abdomen. Musculoskeletal: No acute or significant osseous findings. IMPRESSION: 1. 5.6 cm x 6.3 cm x 5.4 cm complex pancreatic mass which is increased in size when compared to the prior study dated July 02, 2019. 2. Numerous heterogeneous liver lesions consistent with hepatic metastasis. 3. Cholelithiasis. 4. Large gastric hernia. 5. Multiple large uterine masses which may represent large uterine fibroids. Electronically Signed   By: TVirgina NorfolkM.D.   On: 07/21/2019 21:34   CT Abdomen Pelvis W Contrast  Result Date: 07/02/2019 CLINICAL DATA:  Abdominal pain with nausea and vomiting EXAM: CT ABDOMEN AND PELVIS WITH CONTRAST TECHNIQUE: Multidetector CT  imaging of the abdomen and pelvis was performed using the standard protocol following bolus administration of intravenous contrast. CONTRAST:  1067mOMNIPAQUE IOHEXOL 300 MG/ML  SOLN COMPARISON:  CT 04/26/2004 FINDINGS: Lower chest: Lung bases demonstrate subsegmental atelectasis at the lingula, right middle lobe and inferior right upper lobe. No acute consolidation or pleural effusion. The heart size is within normal limits. Irregular moderate hiatal hernia with wall thickening. This is increased compared to prior. Hepatobiliary: Scattered hypodense liver lesions, some of which were present on comparison study. Multiple new ill-defined hypodense liver masses concerning for metastatic disease. Largest suspicious lesion is seen in the right hepatic dome and measures 2.5 cm. Multiple calcified gallstones. No biliary dilatation. Pancreas: Generalized  inflammatory changes about the pancreas. Development of mild ductal dilatation. Ill-defined hypodense mass at the head of the pancreas measuring approximately 3.4 cm transverse by 2.2 cm AP by 3 cm craniocaudad. Spleen: Normal in size without focal abnormality. Adrenals/Urinary Tract: Adrenal glands are unremarkable. Kidneys are normal, without renal calculi, focal lesion, or hydronephrosis. Bladder is unremarkable. Stomach/Bowel: No dilated small bowel. Negative for colon wall thickening. Negative appendix. Vascular/Lymphatic: Nonaneurysmal aorta. Hazy appearance of mesenteric root with subcentimeter nodes. Multiple small nodes or punctate nodules in the right lower quadrant/right gutter. Hypoenhancement of the distal splenic vein and superior mesenteric vein. Reproductive: Markedly enlarged uterus with multiple masses, increased compared to prior. Other: Negative for free air or significant ascites. Anterior abdominal wall laxity. Small supraumbilical fat containing ventral hernia Musculoskeletal: No acute or suspicious osseous abnormality. IMPRESSION: 1. Generalized  pancreatic inflammation. There is an ill-defined hypodense suspicious mass at the head of the pancreas measuring 3.4 x 2.2 x 3 cm. 2. Multiple ill-defined hypodense liver masses, consistent with metastatic disease. 3. Hypoenhancement of the splenic vein and superior mesenteric veins near their confluence, potentially occluded. 4. Multiple punctate nodules or small nodes in the right lower quadrant/gutter, cannot exclude metastatic disease 5. Moderate irregular hiatal hernia with some wall thickening, could be correlated with endoscopy 6. Bulky enlargement of the uterus with multiple large masses, increased compared to prior. Electronically Signed   By: Donavan Foil M.D.   On: 07/02/2019 22:17   MR 3D Recon At Scanner  Result Date: 07/23/2019 CLINICAL DATA:  Inpatient. Pancreatic cancer with biopsy-proven liver metastases based on 07/07/2019 liver mass biopsy, now with hyperbilirubinemia. Chemotherapy initiated 07/21/2019. EXAM: MRI ABDOMEN WITHOUT AND WITH CONTRAST (INCLUDING MRCP) TECHNIQUE: Multiplanar multisequence MR imaging of the abdomen was performed both before and after the administration of intravenous contrast. Heavily T2-weighted images of the biliary and pancreatic ducts were obtained, and three-dimensional MRCP images were rendered by post processing. CONTRAST:  50m GADAVIST GADOBUTROL 1 MMOL/ML IV SOLN COMPARISON:  07/21/2019 CT abdomen/pelvis. FINDINGS: Lower chest: No acute abnormality at the lung bases. Hepatobiliary: There are numerous (greater than 15) similar liver masses scattered throughout the liver each demonstrating mild T2 hyperintensity, restricted diffusion and heterogeneous enhancement, compatible with liver metastases. Representative liver masses measure 5.1 x 3.8 cm in the segment 2 left liver lobe (series 3/image 19), 5.3 x 4.3 cm in the segment 8 right liver lobe (series 3/image 17) and 1.8 x 1.6 cm in the caudate lobe (series 3/image 21). Additional scattered simple liver  cysts, largest 2.3 cm in the posterior right liver lobe. No hepatic steatosis. Nondistended gallbladder contains numerous gallstones, largest 10 mm. No definite gallbladder wall thickening. No significant pericholecystic fluid. Mild-to-moderate diffuse intrahepatic biliary ductal dilatation. Prominent proximal common bile duct dilation up to 18 mm diameter, with abrupt CBD caliber transition at the level of the pancreatic head. No choledocholithiasis. No beading of the intrahepatic bile ducts. Pancreas: Poorly marginated pancreatic head mass measuring approximately 5.3 x 4.9 cm (series 3/image 39) with mixed T1 and T2 signal intensity and thick irregular peripheral enhancement with central necrosis. Dilated main pancreatic duct up to 6 mm diameter. Spleen: Normal size. No mass. Adrenals/Urinary Tract: Normal adrenals. No hydronephrosis. Normal kidneys with no renal mass. Stomach/Bowel: Moderate hiatal hernia. Otherwise normal nondistended stomach. Visualized small and large bowel is normal caliber, with no bowel wall thickening. Vascular/Lymphatic: Normal caliber abdominal aorta. Occluded portal splenic venous confluence with nonocclusive bland appearing thrombosis of the SMV (series 1103/image 60). Nonocclusive bland appearing thrombosis  of main portal vein near the bifurcation with near occlusive thrombosis of the right portal vein. Patent left portal vein. Patent renal and hepatic veins and IVC. No pathologically enlarged lymph nodes in the abdomen. Other: No abdominal ascites or focal fluid collection. Musculoskeletal: No aggressive appearing focal osseous lesions. IMPRESSION: 1. Poorly marginated heterogeneously enhancing 5.3 cm pancreatic head mass compatible with known primary pancreatic malignancy. 2. 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 Mild-to-moderate diffuse intrahepatic biliary ductal dilatation. 3. Occluded  portosplenic venous confluence. 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 Near-occlusive bland appearing thrombosis of the right portal vein. 4. Widespread liver metastases. 5. Moderate hiatal hernia. Electronically Signed   By: Ilona Sorrel M.D.   On: 07/23/2019 14:09   US BIOPSY (LIVER)  Result Date: 07/07/2019 INDICATION: Concern for metastatic pancreatic cancer. Please perform ultrasound-guided liver lesion biopsy for tissue diagnostic purposes. EXAM: ULTRASOUND GUIDED LIVER LESION BIOPSY COMPARISON:  CT abdomen and pelvis-07/02/2019 MEDICATIONS: None ANESTHESIA/SEDATION: Fentanyl 100 mcg IV; Versed 2 mg IV Total Moderate Sedation time:  17 Minutes. The patient's level of consciousness and vital signs were monitored continuously by radiology nursing throughout the procedure under my direct supervision. COMPLICATIONS: None immediate. PROCEDURE: Informed written consent was obtained from the patient after a discussion of the risks, benefits and alternatives to treatment. The patient understands and consents the procedure. A timeout was performed prior to the initiation of the procedure. Ultrasound scanning was performed of the right upper abdominal quadrant demonstrates an ill-defined approximately 2.9 x 3.1 x 2.2 cm hypoechoic nodule/mass within the dome of the right lobe of the liver correlating with the indeterminate mass seen on preceding abdominal CT image 15, series 3. Note, imaging was degraded secondary to patient body habitus well as well as the lesion being located within the dome of the liver with associated interposition of lung. The procedure was planned. The right upper abdominal quadrant was prepped and draped in the usual sterile fashion. The overlying soft tissues were anesthetized with 1% lidocaine with epinephrine. A 17 gauge, 6.8 cm co-axial needle was advanced into a peripheral aspect of the lesion. This was followed by 6 core  biopsies with an 18 gauge core device under direct ultrasound guidance. The coaxial needle tract was embolized with a small amount of Gel-Foam slurry and superficial hemostasis was obtained with manual compression. Post procedural scanning was negative for definitive area of hemorrhage or additional complication. A dressing was placed. The patient tolerated the procedure well without immediate post procedural complication. IMPRESSION: Technically challenging though ultimately successful ultrasound guided core needle biopsy of indeterminate mass within the dome of the right lobe of the liver. Electronically Signed   By: Sandi Mariscal M.D.   On: 07/07/2019 13:34   DG Abd 2 Views  Result Date: 07/08/2019 CLINICAL DATA:  Abdominal pain and distension x3 days. EXAM: ABDOMEN - 2 VIEW COMPARISON:  None. FINDINGS: The bowel gas pattern is normal. A moderate to marked amount of stool is seen within the ascending colon. There is no evidence of free air. No radio-opaque calculi or other significant radiographic abnormality is seen. A radiopaque tubal ligation clip is seen within the pelvis on the right. An additional tubal ligation clip is seen overlying the superior aspect of the sacrum on the left. A 4 mm focal opacity is seen overlying the symphysis pubis on the left. IMPRESSION: 1. Normal bowel  gas pattern. No evidence of free air. Electronically Signed   By: Virgina Norfolk M.D.   On: 07/08/2019 16:27   DG Abd Portable 1V  Result Date: 07/10/2019 CLINICAL DATA:  Abdominal pain EXAM: PORTABLE ABDOMEN - 1 VIEW COMPARISON:  07/08/2019 FINDINGS: Bowel gas pattern remains unremarkable. Stool burden is overall mild with greater burden within the ascending colon. Tubal ligation clips noted. IMPRESSION: Normal bowel gas pattern. Electronically Signed   By: Macy Mis M.D.   On: 07/10/2019 13:20   DG C-Arm 1-60 Min-No Report  Result Date: 07/24/2019 Fluoroscopy was utilized by the requesting physician.  No  radiographic interpretation.   MR ABDOMEN MRCP W WO CONTAST  Result Date: 07/23/2019 CLINICAL DATA:  Inpatient. Pancreatic cancer with biopsy-proven liver metastases based on 07/07/2019 liver mass biopsy, now with hyperbilirubinemia. Chemotherapy initiated 07/21/2019. EXAM: MRI ABDOMEN WITHOUT AND WITH CONTRAST (INCLUDING MRCP) TECHNIQUE: Multiplanar multisequence MR imaging of the abdomen was performed both before and after the administration of intravenous contrast. Heavily T2-weighted images of the biliary and pancreatic ducts were obtained, and three-dimensional MRCP images were rendered by post processing. CONTRAST:  89m GADAVIST GADOBUTROL 1 MMOL/ML IV SOLN COMPARISON:  07/21/2019 CT abdomen/pelvis. FINDINGS: Lower chest: No acute abnormality at the lung bases. Hepatobiliary: There are numerous (greater than 15) similar liver masses scattered throughout the liver each demonstrating mild T2 hyperintensity, restricted diffusion and heterogeneous enhancement, compatible with liver metastases. Representative liver masses measure 5.1 x 3.8 cm in the segment 2 left liver lobe (series 3/image 19), 5.3 x 4.3 cm in the segment 8 right liver lobe (series 3/image 17) and 1.8 x 1.6 cm in the caudate lobe (series 3/image 21). Additional scattered simple liver cysts, largest 2.3 cm in the posterior right liver lobe. No hepatic steatosis. Nondistended gallbladder contains numerous gallstones, largest 10 mm. No definite gallbladder wall thickening. No significant pericholecystic fluid. Mild-to-moderate diffuse intrahepatic biliary ductal dilatation. Prominent proximal common bile duct dilation up to 18 mm diameter, with abrupt CBD caliber transition at the level of the pancreatic head. No choledocholithiasis. No beading of the intrahepatic bile ducts. Pancreas: Poorly marginated pancreatic head mass measuring approximately 5.3 x 4.9 cm (series 3/image 39) with mixed T1 and T2 signal intensity and thick irregular  peripheral enhancement with central necrosis. Dilated main pancreatic duct up to 6 mm diameter. Spleen: Normal size. No mass. Adrenals/Urinary Tract: Normal adrenals. No hydronephrosis. Normal kidneys with no renal mass. Stomach/Bowel: Moderate hiatal hernia. Otherwise normal nondistended stomach. Visualized small and large bowel is normal caliber, with no bowel wall thickening. Vascular/Lymphatic: Normal caliber abdominal aorta. Occluded portal splenic venous confluence with nonocclusive bland appearing thrombosis of the SMV (series 1103/image 60). Nonocclusive bland appearing thrombosis of main portal vein near the bifurcation with near occlusive thrombosis of the right portal vein. Patent left portal vein. Patent renal and hepatic veins and IVC. No pathologically enlarged lymph nodes in the abdomen. Other: No abdominal ascites or focal fluid collection. Musculoskeletal: No aggressive appearing focal osseous lesions. IMPRESSION: 1. Poorly marginated heterogeneously enhancing 5.3 cm pancreatic head mass compatible with known primary pancreatic malignancy. 2. 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 Mild-to-moderate diffuse intrahepatic biliary ductal dilatation. 3. 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 Near-occlusive bland appearing thrombosis of the right portal vein. 4. Widespread liver metastases. 5. Moderate hiatal hernia. Electronically Signed   By: JCorene Cornea  A Poff M.D.   On: 07/23/2019 14:09   IR IMAGING GUIDED PORT INSERTION  Result Date: 07/14/2019 CLINICAL DATA:  Pancreatic carcinoma, needs poor for planned chemotherapy regimen EXAM: TUNNELED PORT CATHETER PLACEMENT WITH ULTRASOUND AND FLUOROSCOPIC GUIDANCE FLUOROSCOPY TIME:  0.6 minute; 52  uGym2 DAP ANESTHESIA/SEDATION: Intravenous Fentanyl 83mg and Versed 250mwere  administered as conscious sedation during continuous monitoring of the patient's level of consciousness and physiological / cardiorespiratory status by the radiology RN, with a total moderate sedation time of 21 minutes. TECHNIQUE: The procedure, risks, benefits, and alternatives were explained to the patient. Questions regarding the procedure were encouraged and answered. The patient understands and consents to the procedure. As antibiotic prophylaxis, cefazolin 2 g was ordered pre-procedure and administered intravenously within one hour of incision. Patency of the right IJ vein was confirmed with ultrasound with image documentation. An appropriate skin site was determined. Skin site was marked. Region was prepped using maximum barrier technique including cap and mask, sterile gown, sterile gloves, large sterile sheet, and Chlorhexidine as cutaneous antisepsis. The region was infiltrated locally with 1% lidocaine. Under real-time ultrasound guidance, the right IJ vein was accessed with a 21 gauge micropuncture needle; the needle tip within the vein was confirmed with ultrasound image documentation. Needle was exchanged over a 018 guidewire for transitional dilator, and vascular measurement was performed. A small incision was made on the right anterior chest wall and a subcutaneous pocket fashioned. The power-injectable port was positioned and its catheter tunneled to the right IJ dermatotomy site. The transitional dilator was exchanged over an Amplatz wire for a peel-away sheath, through which the port catheter, which had been trimmed to the appropriate length, was advanced and positioned under fluoroscopy with its tip at the cavoatrial junction. Spot chest radiograph confirms good catheter position and no pneumothorax. The port was flushed per protocol. The pocket was closed with deep interrupted and subcuticular continuous 3-0 Monocryl sutures. The incisions were covered with Dermabond then covered with a sterile  dressing. The patient tolerated the procedure well. COMPLICATIONS: COMPLICATIONS None immediate IMPRESSION: Technically successful right IJ power-injectable port catheter placement. Ready for routine use. Electronically Signed   By: D Lucrezia Europe.D.   On: 07/14/2019 13:39    Labs:  CBC: Recent Labs    07/15/19 0749 07/16/19 0308 07/21/19 0848 07/24/19 0836  WBC 9.1 10.4 17.5* 16.1*  HGB 10.8* 10.5* 10.2* 8.1*  HCT 32.0* 32.1* 29.4* 24.6*  PLT 154 158 205 205    COAGS: Recent Labs    07/07/19 0050 07/14/19 0621  INR 1.2 1.3*    BMP: Recent Labs    07/22/19 0609 07/22/19 0817 07/23/19 0500 07/24/19 0836  NA 139 131* 134* 137  K <2.0* 3.4* 3.2* 3.0*  CL 118* 96* 100 102  CO2 16* 25 23 25   GLUCOSE 95 155* 166* 142*  BUN 8 12 8  6*  CALCIUM 5.4* 9.0 9.0 9.2  CREATININE <0.30* <0.30* <0.30* <0.30*  GFRNONAA NOT CALCULATED NOT CALCULATED NOT CALCULATED NOT CALCULATED  GFRAA NOT CALCULATED NOT CALCULATED NOT CALCULATED NOT CALCULATED    LIVER FUNCTION TESTS: Recent Labs    07/22/19 0609 07/22/19 0817 07/23/19 0500 07/24/19 0836  BILITOT 14.8* 26.7* 28.0* 28.2*  AST 81* 140* 152* 137*  ALT 114* 203* 198* 183*  ALKPHOS 308* 542* 567* 564*  PROT 3.5* 6.3* 6.4* 6.2*  ALBUMIN 1.2* 2.1* 2.1* 2.0*     Assessment and Plan:  Biliary obstruction secondary to pancreatic mass s/p attempted ERCP (unable to pass  obstruction). Plan for image-guided PTC with internal/external biliary drain placement tentatively for tomorrow 07/25/2019 in IR. Patient will be NPO at midnight. Afebrile. She does not take blood thinners. INR pending for 0500 tomorrow.  Risks and benefits of biliary drain placement were discussed with the patient including, but not limited to bleeding, infection which may lead to sepsis or even death and damage to adjacent structures. This interventional procedure involves the use of X-rays and because of the nature of the planned procedure, it is possible that  we will have prolonged use of X-ray fluoroscopy. Potential radiation risks to you include (but are not limited to) the following: - A slightly elevated risk for cancer  several years later in life. This risk is typically less than 0.5% percent. This risk is low in comparison to the normal incidence of human cancer, which is 33% for women and 50% for men according to the [place]. - Radiation induced injury can include skin redness, resembling a rash, tissue breakdown / ulcers and hair loss (which can be temporary or permanent).  The likelihood of either of these occurring depends on the difficulty of the procedure and whether you are sensitive to radiation due to previous procedures, disease, or genetic conditions.  IF your procedure requires a prolonged use of radiation, you will be notified and given written instructions for further action.  It is your responsibility to monitor the irradiated area for the 2 weeks following the procedure and to notify your physician if you are concerned that you have suffered a radiation induced injury.  Due to patient's somnolence, consent was not obtained today. PA to assess patient tomorrow AM prior to procedure to obtain consent.   Thank you for this interesting consult.  I greatly enjoyed meeting [facility] and look forward to participating in their care.  A copy of this report was sent to the requesting provider on this date.  Electronically Signed: Earley Abide, PA-C 07/24/2019, 4:42 PM   I spent a total of 40 Minutes in face to face in clinical consultation, greater than 50% of which was counseling/coordinating care for biliary obstruction/biliary drain placement.

## 2019-07-24 NOTE — Progress Notes (Addendum)
HEMATOLOGY-ONCOLOGY PROGRESS NOTE  SUBJECTIVE: Reports some mild back pain this morning.  Denies nausea and vomiting.  Bowels are moving.  No new complaints.  Oncology History  Pancreatic adenocarcinoma ([place])  07/10/2019 Initial Diagnosis   Pancreatic adenocarcinoma ([place])   07/21/2019 -  Chemotherapy   The patient had PACLitaxel-protein bound (ABRAXANE) chemo infusion 225 mg, 100 mg/m2 = 225 mg (original dose ), Intravenous,  Once, 0 of 4 cycles Dose modification: 100 mg/m2 (Cycle 1, Reason: Provider Judgment) gemcitabine (GEMZAR) 2,280 mg in sodium chloride 0.9 % 250 mL chemo infusion, 1,000 mg/m2 = 2,280 mg, Intravenous,  Once, 0 of 4 cycles  for chemotherapy treatment.      PHYSICAL EXAMINATION:  Vitals:   07/21/19 2011 07/22/19 0539  BP: (!) 136/102 115/68  Pulse: (!) 104 99  Resp: 16   Temp: (!) 97.3 F (36.3 C) 98.2 F (36.8 C)  SpO2: 97% 97%   Filed Weights   07/21/19 1516  Weight: 238 lb 6.4 oz (108.1 kg)    Intake/Output from previous day: 01/18 0701 - 01/19 0700 In: 847.2 [I.V.:847.2] Out: -   GENERAL:alert, no distress and comfortable EYES: scleral icterus noted OROPHARYNX: white coating on tongue no buccal thrush LUNGS: clear to auscultation and percussion with normal breathing effort HEART: regular rate & rhythm and no murmurs and no lower extremity edema ABDOMEN:abdomen soft, non-tender and normal bowel sounds Musculoskeletal:no cyanosis of digits and no clubbing  NEURO: alert & oriented x 3 with fluent speech, no focal motor/sensory deficits Vascular: Trace pedal edema bilaterally  PAC without erythema or edema  LABORATORY DATA:  I have reviewed the data as listed CMP Latest Ref Rng & Units 07/22/2019 07/21/2019 07/16/2019  Glucose 70 - 99 mg/dL 95 199(H) 137(H)  BUN 8 - 23 mg/dL 8 9 6(L)  Creatinine 0.44 - 1.00 mg/dL <0.30(L) 0.77 0.53  Sodium 135 - 145 mmol/L 139 134(L) 136  Potassium 3.5 - 5.1 mmol/L <2.0(LL) 3.3(L) 3.9  Chloride 98 - 111 mmol/L  118(H) 97(L) 101  CO2 22 - 32 mmol/L 16(L) 26 26  Calcium 8.9 - 10.3 mg/dL 5.4(LL) 9.2 9.0  Total Protein 6.5 - 8.1 g/dL 3.5(L) 6.9 6.6  Total Bilirubin 0.3 - 1.2 mg/dL 14.8(H) 25.9(HH) 17.8(H)  Alkaline Phos 38 - 126 U/L 308(H) 657(H) 496(H)  AST 15 - 41 U/L 81(H) 155(H) 222(H)  ALT 0 - 44 U/L 114(H) 250(H) 337(H)    Lab Results  Component Value Date   WBC 17.5 (H) 07/21/2019   HGB 10.2 (L) 07/21/2019   HCT 29.4 (L) 07/21/2019   MCV 68.5 (L) 07/21/2019   PLT 205 07/21/2019   NEUTROABS 14.7 (H) 07/21/2019    CT CHEST W CONTRAST  Result Date: 07/07/2019 CLINICAL DATA:  Liver mass.  Concern for hepatic metastasis EXAM: CT CHEST WITH CONTRAST TECHNIQUE: Multidetector CT imaging of the chest was performed during intravenous contrast administration. CONTRAST:  13mL OMNIPAQUE IOHEXOL 300 MG/ML  SOLN COMPARISON:  CT 07/02/2019 FINDINGS: Cardiovascular: No significant vascular findings. Normal heart size. No pericardial effusion. Mediastinum/Nodes: No axillary supraclavicular adenopathy. Multiple nodules within the thyroid gland measures 12 mm or less. This is not meet the size requirement for follow-up imaging. No mediastinal lymphadenopathy. Moderate to large hiatal hernia posterior to the LEFT heart. Lungs/Pleura: Linear atelectasis in the lingula and medial RIGHT middle lobe. No suspicious nodularity. Upper Abdomen: Several low-density suspicious lesions in the liver again demonstrated. Inflammation potential mass in the pancreatic head again noted. No interval change from 07/02/2019. Musculoskeletal: No aggressive  osseous lesion. IMPRESSION: 1. No evidence of thoracic metastasis. 2. Low-density lesions in the liver concerning for metastasis. See recent abdomen CT 07/02/2019. 3. Inflammation head of pancreas with concern for underlying neoplasm. Recommend appropriate tumor biomarkers. Electronically Signed   By: Suzy Bouchard M.D.   On: 07/07/2019 08:23   CT ABDOMEN PELVIS W CONTRAST  Result  Date: 07/21/2019 CLINICAL DATA:  History of recently diagnosed metastatic pancreatic cancer. EXAM: CT ABDOMEN AND PELVIS WITH CONTRAST TECHNIQUE: Multidetector CT imaging of the abdomen and pelvis was performed using the standard protocol following bolus administration of intravenous contrast. CONTRAST:  170mL OMNIPAQUE IOHEXOL 300 MG/ML  SOLN COMPARISON:  July 02, 2019 FINDINGS: Lower chest: Mild atelectasis is seen within the anterior aspect of the bilateral lung bases. Hepatobiliary: Numerous heterogeneous low-attenuation liver lesions are seen. These are mildly increased in size when compared to the prior study. Ill-defined gallstones are seen within a contracted gallbladder. Pancreas: A 5.6 cm x 6.3 cm x 5.4 cm complex, partially cystic mass is seen involving the body and head of the pancreas Spleen: Normal in size without focal abnormality. Adrenals/Urinary Tract: Adrenal glands are unremarkable. Kidneys are normal, without renal calculi, focal lesion, or hydronephrosis. Bladder is unremarkable. Stomach/Bowel: There is a large gastric hernia. Appendix appears normal. No evidence of bowel wall thickening, distention, or inflammatory changes. Vascular/Lymphatic: Reproductive: A 12.8 cm x 8.6 cm heterogeneous mass is seen extending from the anterolateral aspect of the uterine fundus on the left. This contains soft tissue and fat density components. Additional 6.8 cm x 6.9 cm, 5.0 cm diameter and 3.7 cm diameter homogeneous uterine mass is seen. Other: Small fat containing ventral hernias are seen along the midline of the lower abdomen. Musculoskeletal: No acute or significant osseous findings. IMPRESSION: 1. 5.6 cm x 6.3 cm x 5.4 cm complex pancreatic mass which is increased in size when compared to the prior study dated July 02, 2019. 2. Numerous heterogeneous liver lesions consistent with hepatic metastasis. 3. Cholelithiasis. 4. Large gastric hernia. 5. Multiple large uterine masses which may represent  large uterine fibroids. Electronically Signed   By: Virgina Norfolk M.D.   On: 07/21/2019 21:34   CT Abdomen Pelvis W Contrast  Result Date: 07/02/2019 CLINICAL DATA:  Abdominal pain with nausea and vomiting EXAM: CT ABDOMEN AND PELVIS WITH CONTRAST TECHNIQUE: Multidetector CT imaging of the abdomen and pelvis was performed using the standard protocol following bolus administration of intravenous contrast. CONTRAST:  131mL OMNIPAQUE IOHEXOL 300 MG/ML  SOLN COMPARISON:  CT 04/26/2004 FINDINGS: Lower chest: Lung bases demonstrate subsegmental atelectasis at the lingula, right middle lobe and inferior right upper lobe. No acute consolidation or pleural effusion. The heart size is within normal limits. Irregular moderate hiatal hernia with wall thickening. This is increased compared to prior. Hepatobiliary: Scattered hypodense liver lesions, some of which were present on comparison study. Multiple new ill-defined hypodense liver masses concerning for metastatic disease. Largest suspicious lesion is seen in the right hepatic dome and measures 2.5 cm. Multiple calcified gallstones. No biliary dilatation. Pancreas: Generalized inflammatory changes about the pancreas. Development of mild ductal dilatation. Ill-defined hypodense mass at the head of the pancreas measuring approximately 3.4 cm transverse by 2.2 cm AP by 3 cm craniocaudad. Spleen: Normal in size without focal abnormality. Adrenals/Urinary Tract: Adrenal glands are unremarkable. Kidneys are normal, without renal calculi, focal lesion, or hydronephrosis. Bladder is unremarkable. Stomach/Bowel: No dilated small bowel. Negative for colon wall thickening. Negative appendix. Vascular/Lymphatic: Nonaneurysmal aorta. Hazy appearance of mesenteric root with  subcentimeter nodes. Multiple small nodes or punctate nodules in the right lower quadrant/right gutter. Hypoenhancement of the distal splenic vein and superior mesenteric vein. Reproductive: Markedly enlarged  uterus with multiple masses, increased compared to prior. Other: Negative for free air or significant ascites. Anterior abdominal wall laxity. Small supraumbilical fat containing ventral hernia Musculoskeletal: No acute or suspicious osseous abnormality. IMPRESSION: 1. Generalized pancreatic inflammation. There is an ill-defined hypodense suspicious mass at the head of the pancreas measuring 3.4 x 2.2 x 3 cm. 2. Multiple ill-defined hypodense liver masses, consistent with metastatic disease. 3. Hypoenhancement of the splenic vein and superior mesenteric veins near their confluence, potentially occluded. 4. Multiple punctate nodules or small nodes in the right lower quadrant/gutter, cannot exclude metastatic disease 5. Moderate irregular hiatal hernia with some wall thickening, could be correlated with endoscopy 6. Bulky enlargement of the uterus with multiple large masses, increased compared to prior. Electronically Signed   By: Donavan Foil M.D.   On: 07/02/2019 22:17   US BIOPSY (LIVER)  Result Date: 07/07/2019 INDICATION: Concern for metastatic pancreatic cancer. Please perform ultrasound-guided liver lesion biopsy for tissue diagnostic purposes. EXAM: ULTRASOUND GUIDED LIVER LESION BIOPSY COMPARISON:  CT abdomen and pelvis-07/02/2019 MEDICATIONS: None ANESTHESIA/SEDATION: Fentanyl 100 mcg IV; Versed 2 mg IV Total Moderate Sedation time:  17 Minutes. The patient's level of consciousness and vital signs were monitored continuously by radiology nursing throughout the procedure under my direct supervision. COMPLICATIONS: None immediate. PROCEDURE: Informed written consent was obtained from the patient after a discussion of the risks, benefits and alternatives to treatment. The patient understands and consents the procedure. A timeout was performed prior to the initiation of the procedure. Ultrasound scanning was performed of the right upper abdominal quadrant demonstrates an ill-defined approximately 2.9 x 3.1 x  2.2 cm hypoechoic nodule/mass within the dome of the right lobe of the liver correlating with the indeterminate mass seen on preceding abdominal CT image 15, series 3. Note, imaging was degraded secondary to patient body habitus well as well as the lesion being located within the dome of the liver with associated interposition of lung. The procedure was planned. The right upper abdominal quadrant was prepped and draped in the usual sterile fashion. The overlying soft tissues were anesthetized with 1% lidocaine with epinephrine. A 17 gauge, 6.8 cm co-axial needle was advanced into a peripheral aspect of the lesion. This was followed by 6 core biopsies with an 18 gauge core device under direct ultrasound guidance. The coaxial needle tract was embolized with a small amount of Gel-Foam slurry and superficial hemostasis was obtained with manual compression. Post procedural scanning was negative for definitive area of hemorrhage or additional complication. A dressing was placed. The patient tolerated the procedure well without immediate post procedural complication. IMPRESSION: Technically challenging though ultimately successful ultrasound guided core needle biopsy of indeterminate mass within the dome of the right lobe of the liver. Electronically Signed   By: Sandi Mariscal M.D.   On: 07/07/2019 13:34   DG Abd 2 Views  Result Date: 07/08/2019 CLINICAL DATA:  Abdominal pain and distension x3 days. EXAM: ABDOMEN - 2 VIEW COMPARISON:  None. FINDINGS: The bowel gas pattern is normal. A moderate to marked amount of stool is seen within the ascending colon. There is no evidence of free air. No radio-opaque calculi or other significant radiographic abnormality is seen. A radiopaque tubal ligation clip is seen within the pelvis on the right. An additional tubal ligation clip is seen overlying the superior aspect of the  sacrum on the left. A 4 mm focal opacity is seen overlying the symphysis pubis on the left. IMPRESSION: 1.  Normal bowel gas pattern. No evidence of free air. Electronically Signed   By: Virgina Norfolk M.D.   On: 07/08/2019 16:27   DG Abd Portable 1V  Result Date: 07/10/2019 CLINICAL DATA:  Abdominal pain EXAM: PORTABLE ABDOMEN - 1 VIEW COMPARISON:  07/08/2019 FINDINGS: Bowel gas pattern remains unremarkable. Stool burden is overall mild with greater burden within the ascending colon. Tubal ligation clips noted. IMPRESSION: Normal bowel gas pattern. Electronically Signed   By: Macy Mis M.D.   On: 07/10/2019 13:20   IR IMAGING GUIDED PORT INSERTION  Result Date: 07/14/2019 CLINICAL DATA:  Pancreatic carcinoma, needs poor for planned chemotherapy regimen EXAM: TUNNELED PORT CATHETER PLACEMENT WITH ULTRASOUND AND FLUOROSCOPIC GUIDANCE FLUOROSCOPY TIME:  0.6 minute; 52  uGym2 DAP ANESTHESIA/SEDATION: Intravenous Fentanyl 38mcg and Versed 2mg  were administered as conscious sedation during continuous monitoring of the patient's level of consciousness and physiological / cardiorespiratory status by the radiology RN, with a total moderate sedation time of 21 minutes. TECHNIQUE: The procedure, risks, benefits, and alternatives were explained to the patient. Questions regarding the procedure were encouraged and answered. The patient understands and consents to the procedure. As antibiotic prophylaxis, cefazolin 2 g was ordered pre-procedure and administered intravenously within one hour of incision. Patency of the right IJ vein was confirmed with ultrasound with image documentation. An appropriate skin site was determined. Skin site was marked. Region was prepped using maximum barrier technique including cap and mask, sterile gown, sterile gloves, large sterile sheet, and Chlorhexidine as cutaneous antisepsis. The region was infiltrated locally with 1% lidocaine. Under real-time ultrasound guidance, the right IJ vein was accessed with a 21 gauge micropuncture needle; the needle tip within the vein was confirmed with  ultrasound image documentation. Needle was exchanged over a 018 guidewire for transitional dilator, and vascular measurement was performed. A small incision was made on the right anterior chest wall and a subcutaneous pocket fashioned. The power-injectable port was positioned and its catheter tunneled to the right IJ dermatotomy site. The transitional dilator was exchanged over an Amplatz wire for a peel-away sheath, through which the port catheter, which had been trimmed to the appropriate length, was advanced and positioned under fluoroscopy with its tip at the cavoatrial junction. Spot chest radiograph confirms good catheter position and no pneumothorax. The port was flushed per protocol. The pocket was closed with deep interrupted and subcuticular continuous 3-0 Monocryl sutures. The incisions were covered with Dermabond then covered with a sterile dressing. The patient tolerated the procedure well. COMPLICATIONS: COMPLICATIONS None immediate IMPRESSION: Technically successful right IJ power-injectable port catheter placement. Ready for routine use. Electronically Signed   By: Lucrezia Europe M.D.   On: 07/14/2019 13:39    ASSESSMENT AND PLAN: 1. Metastatic pancreatic adenocarcinoma -CT of the abdomen pelvis on 07/02/2019 showed a 3.4 x 2.2 x 3  cm mass in the head of the pancreas with multiple ill-defined  hypodense liver masses. -CT of the chest on 07/07/2019 showed no evidence of thoracic  metastasis. -Ultrasound guided liver biopsy on 07/07/2019 demonstrated  metastatic pancreatic adenocarcinoma. -Elevated CA 19-9  -MRI abdomen 22,021-numerous liver metastases, pancreas head mass, bland appearing thrombus at SMV, nonocclusive thrombus of the main portal vein near the bifurcation with the near occlusive thrombus at the right portal vein, patent left portal vein, mild to moderate intrahepatic biliary ductal  dilatation, dilated common caliber transition at the pancreas  head 2. 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 Chronicity, now with mild microcytic anemia 4. Hypokalemia-continue to replete IV/p.o. 5. Uterine fibroids 6. Abdominal pain secondary to #1 7. Nausea 8.  Hyperbilirubinemia, due to a malignant common bile duct stricture  MRI/MRCP pancreas head mass, malignant common bile duct stricture at the level of the pancreas head dilatation of the proximal common bile duct intrahepatic biliary ductal dilatation leg appearing nonocclusive thrombus at the SMV and vein.  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  Numerous liver metastases  Rachel Vasquez appears stable.  Rachel Vasquez has had persistent hyperbilirubinemia which is stable. MRI/MRCP from 07/23/2019 showed a stricture of the common bile duct.  Rachel Vasquez is scheduled for ERCP later today.  Rachel Vasquez has persistent hypokalemia.  1.  ERCP today per GI. 2.  Continue potassium chloride 20 mEq twice a day and will increase potassium in her IV fluids to 30 mEq continue same rate at 75 cc/h.  Repeat CMET tomorrow morning. 3.  Plan to discharge tomorrow if Rachel Vasquez remains medically stable.  Mikey Bussing, DNP, AGPCNP-BC, AOCNP  Rachel Vasquez was interviewed and examined.  The plan is for an attempted ERCP today.  We will consult interventional radiology if a bile duct stent cannot be placed.

## 2019-07-24 NOTE — Transfer of Care (Signed)
Immediate Anesthesia Transfer of Care Note  Patient: Rachel Vasquez  Procedure(s) Performed: ENDOSCOPIC RETROGRADE CHOLANGIOPANCREATOGRAPHY (ERCP) (N/A ) ABORTED CASE ERCP  Patient Location: PACU and Endoscopy Unit  Anesthesia Type:General  Level of Consciousness: awake, sedated and responds to stimulation  Airway & Oxygen Therapy: Patient Spontanous Breathing and Patient connected to face mask oxygen  Post-op Assessment: Report given to RN and Post -op Vital signs reviewed and stable  Post vital signs: Reviewed and stable  Last Vitals:  Vitals Value Taken Time  BP 138/72 07/24/19 1220  Temp    Pulse 89 07/24/19 1220  Resp 26 07/24/19 1220  SpO2 99 % 07/24/19 1220  Vitals shown include unvalidated device data.  Last Pain:  Vitals:   07/24/19 1219  TempSrc: Oral  PainSc:          Complications: No apparent anesthesia complications

## 2019-07-24 NOTE — Op Note (Addendum)
[facility] Patient Name: Rachel Vasquez Procedure Date: 07/24/2019 MRN: [id number] Attending MD: Gatha Mayer , MD Date of Birth: 09-18-1955 CSN: [id number] Age: 64 Admit Type: Inpatient Procedure:                ERCP Indications:              Jaundice, Malignant tumor of the head of pancreas Providers:                Gatha Mayer, MD, Burtis Junes, RN, Lina Sar,                            Technician, Corie Chiquito, Technician, Virgia Land,                            CRNA Referring MD:              Medicines:                General Anesthesia Complications:            No immediate complications. Estimated Blood Loss:     Estimated blood loss was minimal. Procedure:                Pre-Anesthesia Assessment:                           - Prior to the procedure, a History and Physical                            was performed, and patient medications and                            allergies were reviewed. The patient's tolerance of                            previous anesthesia was also reviewed. The risks                            and benefits of the procedure and the sedation                            options and risks were discussed with the patient.                            All questions were answered, and informed consent                            was obtained. Prior Anticoagulants: The patient has                            taken no previous anticoagulant or antiplatelet                            agents. ASA Grade Assessment: IV - A patient with  severe systemic disease that is a constant threat                            to life. After reviewing the risks and benefits,                            the patient was deemed in satisfactory condition to                            undergo the procedure.                           After obtaining informed consent, the scope was                            passed under direct vision.  Throughout the                            procedure, the patient's blood pressure, pulse, and                            oxygen saturations were monitored continuously. The                            [id number]-Q190V ([id number] Olympus duodenoscope was                            introduced through the mouth, and cannulation was                            not possible. The ERCP was performed with                            difficulty due to challenging cannulation because                            of inadequate scope positioning. The patient                            tolerated the procedure well. Scope In: Scope Out: Findings:      The scout film was normal. The esophagus was successfully intubated       under direct vision. The scope was advanced to a normal major papilla in       the descending duodenum without detailed examination of the pharynx,       larynx and associated structures, and upper GI tract. The upper GI tract       was grossly normal. Esophagus not seen well. stomach wall mildly       edematous. Duodenal mucosa normal. the main papilla was abnormal -       polypoid change and no visible orifice. There was deformity of the       duodenum from mass effect and I could not get a good position - was too       close to the papilla. I attempted to cannulate but could not find an  orifice and the tissue was friable and theer was some bleeding that was       minimal but enough to compromise views and ability further so i stopped       and removed the scope. 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                            Causing Obstructive Jaundice Moderate Sedation:      Not Applicable - Patient had care per Anesthesia. Recommendation:           -  Return patient to hospital ward for ongoing care.                           - I have consulted IR to see if they can do PTC/PBD                            and eventual stent                           - I have spoken to patient's sister about this also Procedure Code(s):        --- Professional ---                           [phone], Esophagogastroduodenoscopy, flexible,                            transoral; diagnostic, including collection of                            specimen(s) by brushing or washing, when performed                            (separate procedure) Diagnosis Code(s):        --- Professional ---                           C25.0, Malignant neoplasm of head of pancreas                           R17, Unspecified jaundice CPT copyright 2019 [employer]. All rights reserved. The codes documented in this report are preliminary and upon coder review may  be revised to meet current compliance requirements. Gatha Mayer, MD 07/24/2019 12:27:31 PM This report has been signed electronically. Number of Addenda: 0

## 2019-07-24 NOTE — Progress Notes (Signed)
Call from endoscopy asking if it is necessary for fluids to run through port or can it be saline locked. Confirmed it is OK for port to be capped without fluids running.

## 2019-07-24 NOTE — Interval H&P Note (Signed)
History and Physical Interval Note:  07/24/2019 11:14 AM  Rachel Vasquez  has presented today for surgery, with the diagnosis of CBD dilation.  The various methods of treatment have been discussed with the patient and family. After consideration of risks, benefits and other options for treatment, the patient has consented to  Procedure(s): ENDOSCOPIC RETROGRADE CHOLANGIOPANCREATOGRAPHY (ERCP) (N/A) as a surgical intervention.  The patient's history has been reviewed, patient examined, no change in status, stable for surgery.  I have reviewed the patient's chart and labs.  Questions were answered to the patient's satisfaction.     Silvano Rusk

## 2019-07-24 NOTE — Anesthesia Postprocedure Evaluation (Signed)
Anesthesia Post Note  Patient: Research scientist (physical sciences)  Procedure(s) Performed: ENDOSCOPIC RETROGRADE CHOLANGIOPANCREATOGRAPHY (ERCP) (N/A ) ABORTED CASE ERCP     Patient location during evaluation: PACU Anesthesia Type: General Level of consciousness: sedated and patient cooperative Pain management: pain level controlled Vital Signs Assessment: post-procedure vital signs reviewed and stable Respiratory status: spontaneous breathing Cardiovascular status: stable Anesthetic complications: no    Last Vitals:  Vitals:   07/24/19 1247 07/24/19 1326  BP:  124/69  Pulse:  88  Resp: (!) 25 18  Temp:  36.5 C  SpO2:  96%    Last Pain:  Vitals:   07/24/19 1326  TempSrc: Oral  PainSc:                  Nolon Nations

## 2019-07-24 NOTE — Anesthesia Preprocedure Evaluation (Addendum)
Anesthesia Evaluation  Patient identified by MRN, date of birth, ID band Patient awake    Reviewed: Allergy & Precautions, NPO status , Patient's Chart, lab work & pertinent test results  Airway Mallampati: II  TM Distance: >3 FB Neck ROM: Full    Dental  (+) Dental Advisory Given, Edentulous Upper, Edentulous Lower   Pulmonary neg pulmonary ROS,    Pulmonary exam normal breath sounds clear to auscultation       Cardiovascular negative cardio ROS Normal cardiovascular exam Rhythm:Regular Rate:Normal     Neuro/Psych negative neurological ROS  negative psych ROS   GI/Hepatic Neg liver ROS, hiatal hernia, GERD  ,  Endo/Other  negative endocrine ROS  Renal/GU negative Renal ROS     Musculoskeletal negative musculoskeletal ROS (+)   Abdominal   Peds  Hematology negative hematology ROS (+) anemia ,   Anesthesia Other Findings   Reproductive/Obstetrics negative OB ROS                            Anesthesia Physical Anesthesia Plan  ASA: III  Anesthesia Plan: General   Post-op Pain Management:    Induction: Intravenous  PONV Risk Score and Plan: 3 and Ondansetron, Dexamethasone and Treatment may vary due to age or medical condition  Airway Management Planned: Oral ETT  Additional Equipment: None  Intra-op Plan:   Post-operative Plan: Extubation in OR  Informed Consent: I have reviewed the patients History and Physical, chart, labs and discussed the procedure including the risks, benefits and alternatives for the proposed anesthesia with the patient or authorized representative who has indicated his/her understanding and acceptance.     Dental advisory given  Plan Discussed with: CRNA  Anesthesia Plan Comments:         Anesthesia Quick Evaluation

## 2019-07-25 ENCOUNTER — Inpatient Hospital Stay (HOSPITAL_COMMUNITY): Payer: BC Managed Care – PPO

## 2019-07-25 DIAGNOSIS — N939 Abnormal uterine and vaginal bleeding, unspecified: Secondary | ICD-10-CM

## 2019-07-25 HISTORY — PX: IR INT EXT BILIARY DRAIN WITH CHOLANGIOGRAM: IMG6044

## 2019-07-25 LAB — COMPREHENSIVE METABOLIC PANEL
ALT: 200 U/L — ABNORMAL HIGH (ref 0–44)
AST: 145 U/L — ABNORMAL HIGH (ref 15–41)
Albumin: 2.1 g/dL — ABNORMAL LOW (ref 3.5–5.0)
Alkaline Phosphatase: 641 U/L — ABNORMAL HIGH (ref 38–126)
Anion gap: 12 (ref 5–15)
BUN: 8 mg/dL (ref 8–23)
CO2: 22 mmol/L (ref 22–32)
Calcium: 9.5 mg/dL (ref 8.9–10.3)
Chloride: 103 mmol/L (ref 98–111)
Creatinine, Ser: <0.3 mg/dL — ABNORMAL LOW (ref 0.44–1.00)
Glucose, Bld: 181 mg/dL — ABNORMAL HIGH (ref 70–99)
Potassium: 3.1 mmol/L — ABNORMAL LOW (ref 3.5–5.1)
Sodium: 137 mmol/L (ref 135–145)
Total Bilirubin: 25.9 mg/dL (ref 0.3–1.2)
Total Protein: 6.8 g/dL (ref 6.5–8.1)

## 2019-07-25 LAB — CBC WITH DIFFERENTIAL/PLATELET
Abs Immature Granulocytes: 0.37 10*3/uL — ABNORMAL HIGH (ref 0.00–0.07)
Basophils Absolute: 0 10*3/uL (ref 0.0–0.1)
Basophils Relative: 0 %
Eosinophils Absolute: 0 10*3/uL (ref 0.0–0.5)
Eosinophils Relative: 0 %
HCT: 28.9 % — ABNORMAL LOW (ref 36.0–46.0)
Hemoglobin: 9.4 g/dL — ABNORMAL LOW (ref 12.0–15.0)
Immature Granulocytes: 2 %
Lymphocytes Relative: 6 %
Lymphs Abs: 1.1 10*3/uL (ref 0.7–4.0)
MCH: 23.7 pg — ABNORMAL LOW (ref 26.0–34.0)
MCHC: 32.5 g/dL (ref 30.0–36.0)
MCV: 72.8 fL — ABNORMAL LOW (ref 80.0–100.0)
Monocytes Absolute: 0.9 10*3/uL (ref 0.1–1.0)
Monocytes Relative: 5 %
Neutro Abs: 15.8 10*3/uL — ABNORMAL HIGH (ref 1.7–7.7)
Neutrophils Relative %: 87 %
Platelets: 224 10*3/uL (ref 150–400)
RBC: 3.97 MIL/uL (ref 3.87–5.11)
RDW: 26.4 % — ABNORMAL HIGH (ref 11.5–15.5)
WBC: 18.2 10*3/uL — ABNORMAL HIGH (ref 4.0–10.5)
nRBC: 1 % — ABNORMAL HIGH (ref 0.0–0.2)

## 2019-07-25 LAB — MAGNESIUM: Magnesium: 2.1 mg/dL (ref 1.7–2.4)

## 2019-07-25 LAB — PROTIME-INR
INR: 2.2 — ABNORMAL HIGH (ref 0.8–1.2)
Prothrombin Time: 24.5 seconds — ABNORMAL HIGH (ref 11.4–15.2)

## 2019-07-25 LAB — ABO/RH: ABO/RH(D): O POS

## 2019-07-25 MED ORDER — LIDOCAINE HCL 1 % IJ SOLN
INTRAMUSCULAR | Status: AC
  Filled 2019-07-25: qty 20

## 2019-07-25 MED ORDER — FENTANYL CITRATE (PF) 100 MCG/2ML IJ SOLN
INTRAMUSCULAR | Status: AC | PRN
  Administered 2019-07-25 (×6): 50 ug via INTRAVENOUS

## 2019-07-25 MED ORDER — SODIUM CHLORIDE 0.9% FLUSH
10.0000 mL | INTRAVENOUS | Status: DC | PRN
  Administered 2019-07-27: 21:00:00 40 mL

## 2019-07-25 MED ORDER — ONDANSETRON HCL 4 MG/2ML IJ SOLN
INTRAMUSCULAR | Status: AC
  Filled 2019-07-25: qty 2

## 2019-07-25 MED ORDER — POTASSIUM CHLORIDE IN NACL 40-0.9 MEQ/L-% IV SOLN
INTRAVENOUS | Status: DC
  Administered 2019-07-25 – 2019-07-27 (×2): 75 mL/h via INTRAVENOUS
  Filled 2019-07-25 (×5): qty 1000

## 2019-07-25 MED ORDER — ENOXAPARIN SODIUM 40 MG/0.4ML ~~LOC~~ SOLN: 40.0000 mg | SUBCUTANEOUS | Status: DC

## 2019-07-25 MED ORDER — SODIUM CHLORIDE 0.9% FLUSH
5.0000 mL | Freq: Three times a day (TID) | INTRAVENOUS | Status: DC
  Administered 2019-07-25 – 2019-07-29 (×12): 5 mL

## 2019-07-25 MED ORDER — MIDAZOLAM HCL 2 MG/2ML IJ SOLN
INTRAMUSCULAR | Status: AC
  Filled 2019-07-25: qty 2

## 2019-07-25 MED ORDER — FENTANYL CITRATE (PF) 100 MCG/2ML IJ SOLN
INTRAMUSCULAR | Status: AC
  Filled 2019-07-25: qty 4

## 2019-07-25 MED ORDER — SODIUM CHLORIDE 0.9 % IV SOLN: INTRAVENOUS | Status: DC

## 2019-07-25 MED ORDER — PIPERACILLIN-TAZOBACTAM 3.375 G IVPB
3.3750 g | INTRAVENOUS | Status: AC
  Filled 2019-07-25: qty 50

## 2019-07-25 MED ORDER — LIDOCAINE HCL (PF) 1 % IJ SOLN
INTRAMUSCULAR | Status: AC | PRN
  Administered 2019-07-25 (×2): 10 mL

## 2019-07-25 MED ORDER — IOHEXOL 300 MG/ML  SOLN
50.0000 mL | Freq: Once | INTRAMUSCULAR | Status: AC | PRN
  Administered 2019-07-25: 14:00:00 50 mL

## 2019-07-25 MED ORDER — MIDAZOLAM HCL 2 MG/2ML IJ SOLN
INTRAMUSCULAR | Status: AC | PRN
  Administered 2019-07-25 (×8): 1 mg via INTRAVENOUS

## 2019-07-25 MED ORDER — ONDANSETRON HCL 4 MG/2ML IJ SOLN
INTRAMUSCULAR | Status: AC | PRN
  Administered 2019-07-25: 4 mg via INTRAVENOUS

## 2019-07-25 MED ORDER — MIDAZOLAM HCL 2 MG/2ML IJ SOLN
INTRAMUSCULAR | Status: AC
  Filled 2019-07-25: qty 6

## 2019-07-25 MED ORDER — ENOXAPARIN SODIUM 40 MG/0.4ML ~~LOC~~ SOLN
40.0000 mg | SUBCUTANEOUS | Status: DC
  Administered 2019-07-26 – 2019-07-27 (×2): 40 mg via SUBCUTANEOUS
  Filled 2019-07-25 (×2): qty 0.4

## 2019-07-25 MED ORDER — ALTEPLASE 2 MG IJ SOLR
2.0000 mg | Freq: Once | INTRAMUSCULAR | Status: AC
  Administered 2019-07-25: 2 mg
  Filled 2019-07-25 (×2): qty 2

## 2019-07-25 MED ORDER — PIPERACILLIN-TAZOBACTAM 3.375 G IVPB
INTRAVENOUS | Status: AC
  Administered 2019-07-25: 12:00:00 3.375 g via INTRAVENOUS
  Filled 2019-07-25: qty 50

## 2019-07-25 MED ORDER — FENTANYL CITRATE (PF) 100 MCG/2ML IJ SOLN
INTRAMUSCULAR | Status: AC
  Filled 2019-07-25: qty 2

## 2019-07-25 MED ORDER — LOPERAMIDE HCL 2 MG PO CAPS: 2.0000 mg | ORAL_CAPSULE | ORAL | Status: DC | PRN

## 2019-07-25 NOTE — Progress Notes (Signed)
   Vital Signs MEWS/VS Documentation      07/25/2019 1330 07/25/2019 1411 07/25/2019 1438 07/25/2019 1502   MEWS Score:  4  1  1  1    MEWS Score Color:  Red  Green  Green  Green   Resp:  --  18  18  16    Pulse:  --  99  88  86   BP:  --  (!) 143/90  121/73  109/64   Temp:  --  98.1 F (36.7 C)  98.3 F (36.8 C)  98 F (36.7 C)   Level of Consciousness:  Responds to Voice  --  --  --       Patient MEWS score RED while in IR for biliary tube placement.  Pt returned to floor at 1345.  VS taken and patient returned to GREEN MEWS score.  Q15 vitals completed per protocol.  Patient resting comfortably in room.    Azlan Hanway D Stokley Chandler 07/25/2019,3:19 PM

## 2019-07-25 NOTE — Procedures (Signed)
Pre procedural Dx: Obstructive Juandice Post procedural Dx: Same  Successful placement of a right hepatic approach transhepatic 8 Fr biliary drainage catheter with end coiled and locked within the duodenum.   Biliary drain connected to gravity bag.  EBL: Minimal Complications: None immediate  Ronny Bacon, MD Pager #: [phone]

## 2019-07-25 NOTE — Progress Notes (Signed)
MEDICATION-RELATED CONSULT NOTE   IR Procedure Consult - Anticoagulant/Antiplatelet PTA/Inpatient Med List Review by Pharmacist    Procedure: IR biliary drain placement    Completed: 1/22 @ [id number]  Post-Procedural bleeding risk per IR MD assessment: HIGH  Antithrombotic medications on inpatient or PTA profile prior to procedure:  LMWH ppx    Recommended restart time per IR Post-Procedure Guidelines:  POD1 AM   Other considerations:  Note INR 2.2 d/t extensive metastatic liver disease and reported vaginal bleeding from uterine fibroids   Plan:      Resume Lovenox 40 mg SQ daily starting tomorrow AM  Patient will need to be monitored closely for continued/worsening vaginal bleeding  Reuel Boom, PharmD, BCPS [phone] 07/25/2019, 4:29 PM

## 2019-07-25 NOTE — Progress Notes (Addendum)
HEMATOLOGY-ONCOLOGY PROGRESS NOTE  SUBJECTIVE: ERCP performed yesterday.  Unable to place stent.  IR has been consulted for placement of percutaneous biliary drain which is scheduled for later today. Reports vaginal bleeding from fibroids. No blood noted in stool. Denies abdominal pain, nausea, and vomiting. Reports a few episodes of diarrhea. Requesting transfer of her Oncology care to [facility]. No other complaints this morning.   Oncology History  Pancreatic adenocarcinoma ([place])  07/10/2019 Initial Diagnosis   Pancreatic adenocarcinoma ([place])   07/21/2019 -  Chemotherapy   The patient had PACLitaxel-protein bound (ABRAXANE) chemo infusion 225 mg, 100 mg/m2 = 225 mg (original dose ), Intravenous,  Once, 0 of 4 cycles Dose modification: 100 mg/m2 (Cycle 1, Reason: Provider Judgment) gemcitabine (GEMZAR) 2,280 mg in sodium chloride 0.9 % 250 mL chemo infusion, 1,000 mg/m2 = 2,280 mg, Intravenous,  Once, 0 of 4 cycles  for chemotherapy treatment.      PHYSICAL EXAMINATION:  Vitals:   07/21/19 2011 07/22/19 0539  BP: (!) 136/102 115/68  Pulse: (!) 104 99  Resp: 16   Temp: (!) 97.3 F (36.3 C) 98.2 F (36.8 C)  SpO2: 97% 97%   Filed Weights   07/21/19 1516  Weight: 238 lb 6.4 oz (108.1 kg)    Intake/Output from previous day: 01/18 0701 - 01/19 0700 In: 847.2 [I.V.:847.2] Out: -   GENERAL:alert, no distress and comfortable EYES: scleral icterus noted OROPHARYNX: white coating on tongue no buccal thrush LUNGS: clear to auscultation and percussion with normal breathing effort HEART: regular rate & rhythm and no murmurs and no lower extremity edema ABDOMEN:abdomen soft, non-tender and normal bowel sounds Musculoskeletal:no cyanosis of digits and no clubbing  NEURO: alert & oriented x 3 with fluent speech, no focal motor/sensory deficits Vascular: Trace pedal edema bilaterally  PAC without erythema or edema  LABORATORY DATA:  I have reviewed the data as listed CMP Latest Ref Rng &  Units 07/22/2019 07/21/2019 07/16/2019  Glucose 70 - 99 mg/dL 95 199(H) 137(H)  BUN 8 - 23 mg/dL 8 9 6(L)  Creatinine 0.44 - 1.00 mg/dL <0.30(L) 0.77 0.53  Sodium 135 - 145 mmol/L 139 134(L) 136  Potassium 3.5 - 5.1 mmol/L <2.0(LL) 3.3(L) 3.9  Chloride 98 - 111 mmol/L 118(H) 97(L) 101  CO2 22 - 32 mmol/L 16(L) 26 26  Calcium 8.9 - 10.3 mg/dL 5.4(LL) 9.2 9.0  Total Protein 6.5 - 8.1 g/dL 3.5(L) 6.9 6.6  Total Bilirubin 0.3 - 1.2 mg/dL 14.8(H) 25.9(HH) 17.8(H)  Alkaline Phos 38 - 126 U/L 308(H) 657(H) 496(H)  AST 15 - 41 U/L 81(H) 155(H) 222(H)  ALT 0 - 44 U/L 114(H) 250(H) 337(H)    Lab Results  Component Value Date   WBC 17.5 (H) 07/21/2019   HGB 10.2 (L) 07/21/2019   HCT 29.4 (L) 07/21/2019   MCV 68.5 (L) 07/21/2019   PLT 205 07/21/2019   NEUTROABS 14.7 (H) 07/21/2019    CT CHEST W CONTRAST  Result Date: 07/07/2019 CLINICAL DATA:  Liver mass.  Concern for hepatic metastasis EXAM: CT CHEST WITH CONTRAST TECHNIQUE: Multidetector CT imaging of the chest was performed during intravenous contrast administration. CONTRAST:  10mL OMNIPAQUE IOHEXOL 300 MG/ML  SOLN COMPARISON:  CT 07/02/2019 FINDINGS: Cardiovascular: No significant vascular findings. Normal heart size. No pericardial effusion. Mediastinum/Nodes: No axillary supraclavicular adenopathy. Multiple nodules within the thyroid gland measures 12 mm or less. This is not meet the size requirement for follow-up imaging. No mediastinal lymphadenopathy. Moderate to large hiatal hernia posterior to the LEFT heart. Lungs/Pleura:  Linear atelectasis in the lingula and medial RIGHT middle lobe. No suspicious nodularity. Upper Abdomen: Several low-density suspicious lesions in the liver again demonstrated. Inflammation potential mass in the pancreatic head again noted. No interval change from 07/02/2019. Musculoskeletal: No aggressive osseous lesion. IMPRESSION: 1. No evidence of thoracic metastasis. 2. Low-density lesions in the liver concerning for  metastasis. See recent abdomen CT 07/02/2019. 3. Inflammation head of pancreas with concern for underlying neoplasm. Recommend appropriate tumor biomarkers. Electronically Signed   By: Suzy Bouchard M.D.   On: 07/07/2019 08:23   CT ABDOMEN PELVIS W CONTRAST  Result Date: 07/21/2019 CLINICAL DATA:  History of recently diagnosed metastatic pancreatic cancer. EXAM: CT ABDOMEN AND PELVIS WITH CONTRAST TECHNIQUE: Multidetector CT imaging of the abdomen and pelvis was performed using the standard protocol following bolus administration of intravenous contrast. CONTRAST:  148mL OMNIPAQUE IOHEXOL 300 MG/ML  SOLN COMPARISON:  July 02, 2019 FINDINGS: Lower chest: Mild atelectasis is seen within the anterior aspect of the bilateral lung bases. Hepatobiliary: Numerous heterogeneous low-attenuation liver lesions are seen. These are mildly increased in size when compared to the prior study. Ill-defined gallstones are seen within a contracted gallbladder. Pancreas: A 5.6 cm x 6.3 cm x 5.4 cm complex, partially cystic mass is seen involving the body and head of the pancreas Spleen: Normal in size without focal abnormality. Adrenals/Urinary Tract: Adrenal glands are unremarkable. Kidneys are normal, without renal calculi, focal lesion, or hydronephrosis. Bladder is unremarkable. Stomach/Bowel: There is a large gastric hernia. Appendix appears normal. No evidence of bowel wall thickening, distention, or inflammatory changes. Vascular/Lymphatic: Reproductive: A 12.8 cm x 8.6 cm heterogeneous mass is seen extending from the anterolateral aspect of the uterine fundus on the left. This contains soft tissue and fat density components. Additional 6.8 cm x 6.9 cm, 5.0 cm diameter and 3.7 cm diameter homogeneous uterine mass is seen. Other: Small fat containing ventral hernias are seen along the midline of the lower abdomen. Musculoskeletal: No acute or significant osseous findings. IMPRESSION: 1. 5.6 cm x 6.3 cm x 5.4 cm complex  pancreatic mass which is increased in size when compared to the prior study dated July 02, 2019. 2. Numerous heterogeneous liver lesions consistent with hepatic metastasis. 3. Cholelithiasis. 4. Large gastric hernia. 5. Multiple large uterine masses which may represent large uterine fibroids. Electronically Signed   By: Virgina Norfolk M.D.   On: 07/21/2019 21:34   CT Abdomen Pelvis W Contrast  Result Date: 07/02/2019 CLINICAL DATA:  Abdominal pain with nausea and vomiting EXAM: CT ABDOMEN AND PELVIS WITH CONTRAST TECHNIQUE: Multidetector CT imaging of the abdomen and pelvis was performed using the standard protocol following bolus administration of intravenous contrast. CONTRAST:  127mL OMNIPAQUE IOHEXOL 300 MG/ML  SOLN COMPARISON:  CT 04/26/2004 FINDINGS: Lower chest: Lung bases demonstrate subsegmental atelectasis at the lingula, right middle lobe and inferior right upper lobe. No acute consolidation or pleural effusion. The heart size is within normal limits. Irregular moderate hiatal hernia with wall thickening. This is increased compared to prior. Hepatobiliary: Scattered hypodense liver lesions, some of which were present on comparison study. Multiple new ill-defined hypodense liver masses concerning for metastatic disease. Largest suspicious lesion is seen in the right hepatic dome and measures 2.5 cm. Multiple calcified gallstones. No biliary dilatation. Pancreas: Generalized inflammatory changes about the pancreas. Development of mild ductal dilatation. Ill-defined hypodense mass at the head of the pancreas measuring approximately 3.4 cm transverse by 2.2 cm AP by 3 cm craniocaudad. Spleen: Normal in size without focal  abnormality. Adrenals/Urinary Tract: Adrenal glands are unremarkable. Kidneys are normal, without renal calculi, focal lesion, or hydronephrosis. Bladder is unremarkable. Stomach/Bowel: No dilated small bowel. Negative for colon wall thickening. Negative appendix.  Vascular/Lymphatic: Nonaneurysmal aorta. Hazy appearance of mesenteric root with subcentimeter nodes. Multiple small nodes or punctate nodules in the right lower quadrant/right gutter. Hypoenhancement of the distal splenic vein and superior mesenteric vein. Reproductive: Markedly enlarged uterus with multiple masses, increased compared to prior. Other: Negative for free air or significant ascites. Anterior abdominal wall laxity. Small supraumbilical fat containing ventral hernia Musculoskeletal: No acute or suspicious osseous abnormality. IMPRESSION: 1. Generalized pancreatic inflammation. There is an ill-defined hypodense suspicious mass at the head of the pancreas measuring 3.4 x 2.2 x 3 cm. 2. Multiple ill-defined hypodense liver masses, consistent with metastatic disease. 3. Hypoenhancement of the splenic vein and superior mesenteric veins near their confluence, potentially occluded. 4. Multiple punctate nodules or small nodes in the right lower quadrant/gutter, cannot exclude metastatic disease 5. Moderate irregular hiatal hernia with some wall thickening, could be correlated with endoscopy 6. Bulky enlargement of the uterus with multiple large masses, increased compared to prior. Electronically Signed   By: Donavan Foil M.D.   On: 07/02/2019 22:17   US BIOPSY (LIVER)  Result Date: 07/07/2019 INDICATION: Concern for metastatic pancreatic cancer. Please perform ultrasound-guided liver lesion biopsy for tissue diagnostic purposes. EXAM: ULTRASOUND GUIDED LIVER LESION BIOPSY COMPARISON:  CT abdomen and pelvis-07/02/2019 MEDICATIONS: None ANESTHESIA/SEDATION: Fentanyl 100 mcg IV; Versed 2 mg IV Total Moderate Sedation time:  17 Minutes. The patient's level of consciousness and vital signs were monitored continuously by radiology nursing throughout the procedure under my direct supervision. COMPLICATIONS: None immediate. PROCEDURE: Informed written consent was obtained from the patient after a discussion of the  risks, benefits and alternatives to treatment. The patient understands and consents the procedure. A timeout was performed prior to the initiation of the procedure. Ultrasound scanning was performed of the right upper abdominal quadrant demonstrates an ill-defined approximately 2.9 x 3.1 x 2.2 cm hypoechoic nodule/mass within the dome of the right lobe of the liver correlating with the indeterminate mass seen on preceding abdominal CT image 15, series 3. Note, imaging was degraded secondary to patient body habitus well as well as the lesion being located within the dome of the liver with associated interposition of lung. The procedure was planned. The right upper abdominal quadrant was prepped and draped in the usual sterile fashion. The overlying soft tissues were anesthetized with 1% lidocaine with epinephrine. A 17 gauge, 6.8 cm co-axial needle was advanced into a peripheral aspect of the lesion. This was followed by 6 core biopsies with an 18 gauge core device under direct ultrasound guidance. The coaxial needle tract was embolized with a small amount of Gel-Foam slurry and superficial hemostasis was obtained with manual compression. Post procedural scanning was negative for definitive area of hemorrhage or additional complication. A dressing was placed. The patient tolerated the procedure well without immediate post procedural complication. IMPRESSION: Technically challenging though ultimately successful ultrasound guided core needle biopsy of indeterminate mass within the dome of the right lobe of the liver. Electronically Signed   By: Sandi Mariscal M.D.   On: 07/07/2019 13:34   DG Abd 2 Views  Result Date: 07/08/2019 CLINICAL DATA:  Abdominal pain and distension x3 days. EXAM: ABDOMEN - 2 VIEW COMPARISON:  None. FINDINGS: The bowel gas pattern is normal. A moderate to marked amount of stool is seen within the ascending colon. There is  no evidence of free air. No radio-opaque calculi or other significant  radiographic abnormality is seen. A radiopaque tubal ligation clip is seen within the pelvis on the right. An additional tubal ligation clip is seen overlying the superior aspect of the sacrum on the left. A 4 mm focal opacity is seen overlying the symphysis pubis on the left. IMPRESSION: 1. Normal bowel gas pattern. No evidence of free air. Electronically Signed   By: Virgina Norfolk M.D.   On: 07/08/2019 16:27   DG Abd Portable 1V  Result Date: 07/10/2019 CLINICAL DATA:  Abdominal pain EXAM: PORTABLE ABDOMEN - 1 VIEW COMPARISON:  07/08/2019 FINDINGS: Bowel gas pattern remains unremarkable. Stool burden is overall mild with greater burden within the ascending colon. Tubal ligation clips noted. IMPRESSION: Normal bowel gas pattern. Electronically Signed   By: Macy Mis M.D.   On: 07/10/2019 13:20   IR IMAGING GUIDED PORT INSERTION  Result Date: 07/14/2019 CLINICAL DATA:  Pancreatic carcinoma, needs poor for planned chemotherapy regimen EXAM: TUNNELED PORT CATHETER PLACEMENT WITH ULTRASOUND AND FLUOROSCOPIC GUIDANCE FLUOROSCOPY TIME:  0.6 minute; 52  uGym2 DAP ANESTHESIA/SEDATION: Intravenous Fentanyl 64mcg and Versed 2mg  were administered as conscious sedation during continuous monitoring of the patient's level of consciousness and physiological / cardiorespiratory status by the radiology RN, with a total moderate sedation time of 21 minutes. TECHNIQUE: The procedure, risks, benefits, and alternatives were explained to the patient. Questions regarding the procedure were encouraged and answered. The patient understands and consents to the procedure. As antibiotic prophylaxis, cefazolin 2 g was ordered pre-procedure and administered intravenously within one hour of incision. Patency of the right IJ vein was confirmed with ultrasound with image documentation. An appropriate skin site was determined. Skin site was marked. Region was prepped using maximum barrier technique including cap and mask, sterile  gown, sterile gloves, large sterile sheet, and Chlorhexidine as cutaneous antisepsis. The region was infiltrated locally with 1% lidocaine. Under real-time ultrasound guidance, the right IJ vein was accessed with a 21 gauge micropuncture needle; the needle tip within the vein was confirmed with ultrasound image documentation. Needle was exchanged over a 018 guidewire for transitional dilator, and vascular measurement was performed. A small incision was made on the right anterior chest wall and a subcutaneous pocket fashioned. The power-injectable port was positioned and its catheter tunneled to the right IJ dermatotomy site. The transitional dilator was exchanged over an Amplatz wire for a peel-away sheath, through which the port catheter, which had been trimmed to the appropriate length, was advanced and positioned under fluoroscopy with its tip at the cavoatrial junction. Spot chest radiograph confirms good catheter position and no pneumothorax. The port was flushed per protocol. The pocket was closed with deep interrupted and subcuticular continuous 3-0 Monocryl sutures. The incisions were covered with Dermabond then covered with a sterile dressing. The patient tolerated the procedure well. COMPLICATIONS: COMPLICATIONS None immediate IMPRESSION: Technically successful right IJ power-injectable port catheter placement. Ready for routine use. Electronically Signed   By: Lucrezia Europe M.D.   On: 07/14/2019 13:39    ASSESSMENT AND PLAN: 1. Metastatic pancreatic adenocarcinoma -CT of the abdomen pelvis on 07/02/2019 showed a 3.4 x 2.2 x 3  cm mass in the head of the pancreas with multiple ill-defined  hypodense liver masses. -CT of the chest on 07/07/2019 showed no evidence of thoracic  metastasis. -Ultrasound guided liver biopsy on 07/07/2019 demonstrated  metastatic pancreatic adenocarcinoma. -Elevated CA 19-9  -MRI  abdomen 22,021-numerous liver metastases, pancreas head mass, bland appearing  thrombus at SMV, nonocclusive thrombus of the main portal vein near the bifurcation with the near occlusive thrombus at the right portal vein, patent left portal vein, mild to moderate intrahepatic biliary ductal dilatation, dilated common caliber transition at the pancreas head 2. 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 Chronicity, now with mild microcytic anemia 4. Hypokalemia-continue to replete IV/p.o. 5. Uterine fibroids 6. Abdominal pain secondary to #1 7. Nausea 8.  Hyperbilirubinemia, due to a malignant common bile duct stricture  MRI/MRCP pancreas head mass, malignant common bile duct stricture at the level of the pancreas head dilatation of the proximal common bile duct intrahepatic biliary ductal dilatation leg appearing nonocclusive thrombus at the SMV and vein.  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  Numerous liver metastases  Ms. Heitmeyer appears stable.  She has had persistent hyperbilirubinemia which is stable. MRI/MRCP from 07/23/2019 showed a stricture of the common bile duct.  Unable to place stent during ERCP 07/23/2018. For percutaneous bilary drain placement today in IR. She has persistent hypokalemia. Reports vaginal bleeding from fibroids.   1.  Percutaneous biliary drain placement today by IR. 2.   will increase potassium in her IV fluids to 40 mEq continue same rate at 75 cc/h.  Repeat CMET tomorrow morning. Will add on Magnesium to labs already drawn today and recheck magnesium in the morning.  3. Will restart Lovenox for DVT prophylaxis once biliary drain is placed.  4. Monitor vaginal bleeding for now. Repeat CBC in the morning.  5. I have added Imodium PRN for diarrhea.  6. We can arrange for a second opinion at [facility] regarding her metastatic pancreatic cancer as an  outpatient.   Mikey Bussing, DNP, AGPCNP-BC, AOCNP Ms. Brickley was interviewed and examined.  The ERCP yesterday revealed compression of the duodenum by tumor and a bile duct stent could not be placed.  She is scheduled for placement of a percutaneous drain today.  She reports she is unable to take the potassium pills.  We will continue an intravenous potassium supplement.   She will remain hospitalized for the weekend to monitor the bilirubin level.  She reports vaginal bleeding has been present in the past and is felt to be related to uterine fibroids.

## 2019-07-25 NOTE — Plan of Care (Signed)
  Problem: Education: Goal: Knowledge of General Education information will improve Description: Including pain rating scale, medication(s)/side effects and non-pharmacologic comfort measures Outcome: Progressing   Problem: Clinical Measurements: Goal: Respiratory complications will improve Outcome: Progressing Goal: Cardiovascular complication will be avoided Outcome: Progressing   Problem: Elimination: Goal: Will not experience complications related to bowel motility Outcome: Progressing Goal: Will not experience complications related to urinary retention Outcome: Progressing   Problem: Safety: Goal: Ability to remain free from injury will improve Outcome: Progressing   Problem: Skin Integrity: Goal: Risk for impaired skin integrity will decrease Outcome: Progressing   Problem: Health Behavior/Discharge Planning: Goal: Ability to manage health-related needs will improve Outcome: Not Progressing   Problem: Clinical Measurements: Goal: Ability to maintain clinical measurements within normal limits will improve Outcome: Not Progressing Goal: Will remain free from infection Outcome: Not Progressing Goal: Diagnostic test results will improve Outcome: Not Progressing   Problem: Activity: Goal: Risk for activity intolerance will decrease Outcome: Not Progressing   Problem: Nutrition: Goal: Adequate nutrition will be maintained Outcome: Not Progressing   Problem: Coping: Goal: Level of anxiety will decrease Outcome: Not Progressing   Problem: Pain Managment: Goal: General experience of comfort will improve Outcome: Not Progressing

## 2019-07-25 NOTE — Progress Notes (Signed)
Patient ID: Rachel Vasquez, female   DOB: [birth year], 64 y.o.   MRN: [id number] Patient scheduled for attempted PTC with biliary drain placement today.  Please see consult note from yesterday for additional details of medical hx.  Details/risks of procedure, including but not limited to, internal bleeding, infection, injury to adjacent structures, inability to decrease liver function tests discussed with patient with her understanding and consent.

## 2019-07-26 DIAGNOSIS — R197 Diarrhea, unspecified: Secondary | ICD-10-CM

## 2019-07-26 DIAGNOSIS — E8809 Other disorders of plasma-protein metabolism, not elsewhere classified: Secondary | ICD-10-CM

## 2019-07-26 DIAGNOSIS — D509 Iron deficiency anemia, unspecified: Secondary | ICD-10-CM

## 2019-07-26 LAB — PREPARE RBC (CROSSMATCH)

## 2019-07-26 LAB — COMPREHENSIVE METABOLIC PANEL
ALT: 167 U/L — ABNORMAL HIGH (ref 0–44)
AST: 88 U/L — ABNORMAL HIGH (ref 15–41)
Albumin: 1.9 g/dL — ABNORMAL LOW (ref 3.5–5.0)
Alkaline Phosphatase: 504 U/L — ABNORMAL HIGH (ref 38–126)
Anion gap: 8 (ref 5–15)
BUN: 9 mg/dL (ref 8–23)
CO2: 25 mmol/L (ref 22–32)
Calcium: 9.1 mg/dL (ref 8.9–10.3)
Chloride: 103 mmol/L (ref 98–111)
Creatinine, Ser: <0.3 mg/dL — ABNORMAL LOW (ref 0.44–1.00)
Glucose, Bld: 191 mg/dL — ABNORMAL HIGH (ref 70–99)
Potassium: 3.3 mmol/L — ABNORMAL LOW (ref 3.5–5.1)
Sodium: 136 mmol/L (ref 135–145)
Total Bilirubin: 17.2 mg/dL — ABNORMAL HIGH (ref 0.3–1.2)
Total Protein: 5.8 g/dL — ABNORMAL LOW (ref 6.5–8.1)

## 2019-07-26 LAB — IRON AND TIBC
Iron: 149 ug/dL (ref 28–170)
Saturation Ratios: 85 % — ABNORMAL HIGH (ref 10.4–31.8)
TIBC: 175 ug/dL — ABNORMAL LOW (ref 250–450)
UIBC: 26 ug/dL

## 2019-07-26 LAB — MAGNESIUM: Magnesium: 2 mg/dL (ref 1.7–2.4)

## 2019-07-26 LAB — CBC WITH DIFFERENTIAL/PLATELET
Abs Immature Granulocytes: 0.3 10*3/uL — ABNORMAL HIGH (ref 0.00–0.07)
Basophils Absolute: 0 10*3/uL (ref 0.0–0.1)
Basophils Relative: 0 %
Eosinophils Absolute: 0.1 10*3/uL (ref 0.0–0.5)
Eosinophils Relative: 1 %
HCT: 23.4 % — ABNORMAL LOW (ref 36.0–46.0)
Hemoglobin: 7.6 g/dL — ABNORMAL LOW (ref 12.0–15.0)
Immature Granulocytes: 2 %
Lymphocytes Relative: 8 %
Lymphs Abs: 1.2 10*3/uL (ref 0.7–4.0)
MCH: 24.2 pg — ABNORMAL LOW (ref 26.0–34.0)
MCHC: 32.5 g/dL (ref 30.0–36.0)
MCV: 74.5 fL — ABNORMAL LOW (ref 80.0–100.0)
Monocytes Absolute: 0.9 10*3/uL (ref 0.1–1.0)
Monocytes Relative: 6 %
Neutro Abs: 12.1 10*3/uL — ABNORMAL HIGH (ref 1.7–7.7)
Neutrophils Relative %: 83 %
Platelets: 185 10*3/uL (ref 150–400)
RBC: 3.14 MIL/uL — ABNORMAL LOW (ref 3.87–5.11)
RDW: 26.9 % — ABNORMAL HIGH (ref 11.5–15.5)
WBC: 14.6 10*3/uL — ABNORMAL HIGH (ref 4.0–10.5)
nRBC: 2.2 % — ABNORMAL HIGH (ref 0.0–0.2)

## 2019-07-26 LAB — HEMOGLOBIN AND HEMATOCRIT, BLOOD
HCT: 29.4 % — ABNORMAL LOW (ref 36.0–46.0)
Hemoglobin: 9.6 g/dL — ABNORMAL LOW (ref 12.0–15.0)

## 2019-07-26 LAB — FERRITIN: Ferritin: 853 ng/mL — ABNORMAL HIGH (ref 11–307)

## 2019-07-26 MED ORDER — SODIUM CHLORIDE 0.9% IV SOLUTION: Freq: Once | INTRAVENOUS | Status: AC

## 2019-07-26 NOTE — Progress Notes (Signed)
Supervising Physician: Markus Daft  Patient Status: [facility] - In-pt  Subjective: S/p PTC with Int/Ext drain yesterday. Feeling okay, a little weak. Getting PRBC transfusion now  Objective: Physical Exam: BP 138/78 (BP Location: Right Arm)   Pulse 91   Temp 99 F (37.2 C) (Oral)   Resp (!) 21   Ht 5' 7"  (1.702 m)   Wt 108 kg   SpO2 96%   BMI 37.29 kg/m  Bili drain intact iste clean. Under dressing with some blood staining, likely post procedure, no active bleeding. Bilious output   Current Facility-Administered Medications:  .  0.9 % NaCl with KCl 40 mEq / L  infusion, , Intravenous, Continuous, Curcio, Kristin R, NP, Last Rate: 75 mL/hr at 07/25/19 1103, 75 mL/hr at 07/25/19 1103 .  amLODipine (NORVASC) tablet 10 mg, 10 mg, Oral, Daily, Gatha Mayer, MD, 10 mg at 07/26/19 1034 .  Chlorhexidine Gluconate Cloth 2 % PADS 6 each, 6 each, Topical, Daily, Gatha Mayer, MD, 6 each at 07/26/19 1034 .  enoxaparin (LOVENOX) injection 40 mg, 40 mg, Subcutaneous, Q24H, Wofford, Drew A, RPH, 40 mg at 07/26/19 1034 .  loperamide (IMODIUM) capsule 2 mg, 2 mg, Oral, PRN, Curcio, Kristin R, NP .  LORazepam (ATIVAN) tablet 0.5 mg, 0.5 mg, Oral, Q6H PRN, Gatha Mayer, MD .  metoCLOPramide (REGLAN) tablet 5 mg, 5 mg, Oral, TID AC, Gatha Mayer, MD, 5 mg at 07/26/19 1158 .  ondansetron (ZOFRAN-ODT) disintegrating tablet 4 mg, 4 mg, Oral, Q8H PRN, Gatha Mayer, MD .  oxyCODONE (Oxy IR/ROXICODONE) immediate release tablet 5 mg, 5 mg, Oral, Q4H PRN, Gatha Mayer, MD, 5 mg at 07/25/19 2001 .  oxyCODONE (OXYCONTIN) 12 hr tablet 10 mg, 10 mg, Oral, QHS, Gatha Mayer, MD, 10 mg at 07/25/19 2342 .  oxyCODONE (OXYCONTIN) 12 hr tablet 20 mg, 20 mg, Oral, q morning - 10a, Gatha Mayer, MD, 20 mg at 07/26/19 1033 .  pantoprazole (PROTONIX) EC tablet 40 mg, 40 mg, Oral, Daily, Gatha Mayer, MD, 40 mg at 07/26/19 1034 .  polyethylene glycol (MIRALAX / GLYCOLAX) packet 17 g, 17 g,  Oral, Daily PRN, Gatha Mayer, MD .  predniSONE (DELTASONE) tablet 10 mg, 10 mg, Oral, Q breakfast, Gatha Mayer, MD, 10 mg at 07/26/19 0801 .  sodium chloride flush (NS) 0.9 % injection 10-40 mL, 10-40 mL, Intracatheter, PRN, Betsy Coder B, MD .  sodium chloride flush (NS) 0.9 % injection 5 mL, 5 mL, Intracatheter, Q8H, Sandi Mariscal, MD, 5 mL at 07/26/19 0624  Labs: CBC Recent Labs    07/25/19 0731 07/26/19 0549  WBC 18.2* 14.6*  HGB 9.4* 7.6*  HCT 28.9* 23.4*  PLT 224 185   BMET Recent Labs    07/25/19 0731 07/26/19 0549  NA 137 136  K 3.1* 3.3*  CL 103 103  CO2 22 25  GLUCOSE 181* 191*  BUN 8 9  CREATININE <0.30* <0.30*  CALCIUM 9.5 9.1   LFT Recent Labs    07/26/19 0549  PROT 5.8*  ALBUMIN 1.9*  AST 88*  ALT 167*  ALKPHOS 504*  BILITOT 17.2*   PT/INR Recent Labs    07/25/19 0731  LABPROT 24.5*  INR 2.2*     Studies/Results: DG C-Arm 1-60 Min-No Report  Result Date: 07/24/2019 Fluoroscopy was utilized by the requesting physician.  No radiographic interpretation.   IR INT EXT BILIARY DRAIN WITH CHOLANGIOGRAM  Result Date: 07/25/2019 INDICATION: Concern for  metastatic pancreatic cancer, now with elevated LFTs and failed attempted endoscopic biliary stent placement. As such, request made for attempted placement of an internal/external biliary drainage catheter in hopes of improving the patient's LFTs so she may be a candidate for chemotherapy. Note, preceding cross-sectional imaging is negative for significant intrahepatic biliary ductal dilatation. EXAM: ULTRASOUND AND FLUOROSCOPIC GUIDED PERCUTANEOUS TRANSHEPATIC CHOLANGIOGRAM AND BILIARY TUBE PLACEMENT COMPARISON:  CT abdomen pelvis-07/21/2019; MRCP-07/23/2019 MEDICATIONS: Zosyn 3.75 g IV; the antibiotic was administered with an appropriate frame prior to initiation of the procedure. CONTRAST:  34m OMNIPAQUE IOHEXOL 300 MG/ML SOLN - administered into the biliary tree ANESTHESIA/SEDATION: Moderate  (conscious) sedation was employed during this procedure. A total of Versed 8 mg and Fentanyl 300 mcg was administered intravenously. Moderate Sedation Time: 68 minutes. The patient's level of consciousness and vital signs were monitored continuously by radiology nursing throughout the procedure under my direct supervision. FLUOROSCOPY TIME:  22 minutes (19,528mGy) COMPLICATIONS: None immediate. TECHNIQUE: Informed written consent was obtained from the patient after a discussion of the risks, benefits and alternatives to treatment. Questions regarding the procedure were encouraged and answered. A timeout was performed prior to the initiation of the procedure. The right upper abdominal quadrant was prepped and draped in the usual sterile fashion, and a sterile drape was applied covering the operative field. Maximum barrier sterile technique with sterile gowns and gloves were used for the procedure. A timeout was performed prior to the initiation of the procedure. Ultrasound scanning of the right upper abdominal quadrant was performed to delineate the anatomy and avoid transgression of the gallbladder or the pleural. A spot along the right mid axillary line was marked fluoroscopically inferior to the right costophrenic angle. Sonographic evaluation was negative for significant dilatation of the intrahepatic biliary tree. Initial attempts were made to opacify a nondilated peripheral duct within the right lobe of the liver however despite transient opacification, the duct could not be cannulated. As such, under ultrasound guidance, a 22 gauge needle was advanced towards the central aspect of the biliary tree. Contrast was injected as the needle was slowly retracted ultimately opacifying a central intrahepatic biliary duct. The duct was cannulated with a Nitrex wire which was advanced to the level of the CBD. Under fluoroscopic guidance, the access needle was exchanged for the inner 3 French catheter of the Accustick set.  A limited percutaneous cholangiogram was then performed. Next, a now opacified nondilated duct within the posterior aspect the right lobe of the liver was targeted with a second 22 gauge needle allowing advancement of a Nitrex wire through the cannulated peripheral bile duct to the level of the CBD. The track was dilated with the ANorth Platteset. Next, with the use of a 4 French angled glide catheter, a regular glidewire was advanced through the CBD past the distally obstructing lesion to the level of the duodenum. Contrast injection confirmed appropriate positioning. Over an Amplatz wire, the track was dilated ultimately allowing placement of an 8 French percutaneous biliary drainage catheter. Note, neither a 10 or 12 French percutaneous biliary drainage catheter was available at the time of this initial placement. Contrast was injected as several spot radiographic images were obtained in various obliquities. The biliary drainage catheter was connected to a gravity bag and secured in place with a interrupted suture and a Stat Lock device. The patient tolerated the procedure well without immediate postprocedural complication. FINDINGS: Sonographic evaluation was negative for any significant intrahepatic biliary ductal dilatation, as was demonstrated recent cross-sectional imaging. A central aspect  of the right biliary tree was ultimately opacified and cannulated allowing access to a nondilated peripheral duct within posteroinferior segment of the right lobe of the liver utilizing a 2 stick technique, ultimately allowing placement of an 8 [place] biliary drainage catheter with end coiled locked within the duodenum. Limited percutaneous cholangiogram demonstrates long segment narrowing/occlusion involving the mid and peripheral aspect of the CBD. IMPRESSION: Successful placement of an 8 French percutaneous biliary drainage catheter with end coiled and locked within the duodenum. PLAN: - Recommend obtaining daily CMP  levels until a nadir of the patient's bilirubin level is obtained. - Note, the standard sized 10 French percutaneous biliary drainage catheter was not available at the of this placement (neither was a 44 [place]) and as such, an 8 [place] drainage catheter was placed. As such, this drainage catheter may be upsized at a later date as clinically indicated. - Consideration for definitive internal biliary stent placement may be considered in 4-6 weeks pending the patient's ultimate plan of care. Electronically Signed   By: Sandi Mariscal M.D.   On: 07/25/2019 15:42    Assessment/Plan: Pancreatic cancer with obstructive jaundice S/p PTC with I/E bili drain 1/22 WBC down to 14, T bIli down to 17 Monitor output,  IR following    LOS: 5 days   I spent a total of 15 minutes in face to face in clinical consultation, greater than 50% of which was counseling/coordinating care for PTC bili drain  Ascencion Dike PA-C 07/26/2019 11:58 AM

## 2019-07-26 NOTE — Evaluation (Signed)
Physical Therapy Evaluation Patient Details Name: Rachel Vasquez MRN: [id number] DOB: [birth year] Today's Date: 07/26/2019   History of Present Illness  Pt admitted with Pancreatic CA with liver mets and found to have hyperbilirubinemia -?bilary obstruction; Pt s/p PTC with INT/EXT Drain 07/25/19  Clinical Impression  Pt admitted as above and presenting with functional mobility limitations 2* generalized weakness, fatigue, and ambulatory balance deficits.  Pt hopes to progress to dc home with assist of sister from out of state.    Follow Up Recommendations No PT follow up;Supervision for mobility/OOB    Equipment Recommendations  None recommended by PT    Recommendations for Other Services       Precautions / Restrictions Precautions Precautions: Fall Precaution Comments: drain in place R back Restrictions Weight Bearing Restrictions: No      Mobility  Bed Mobility Overal bed mobility: Needs Assistance       Supine to sit: Min guard     General bed mobility comments: increased time2* fatigue  Transfers Overall transfer level: Needs assistance Equipment used: Rolling walker (2 wheeled) Transfers: Sit to/from Stand Sit to Stand: Min guard         General transfer comment: cues for use of UEs to self assist; steady assist only  Ambulation/Gait Ambulation/Gait assistance: Min guard Gait Distance (Feet): 6 Feet Assistive device: Rolling walker (2 wheeled) Gait Pattern/deviations: Step-through pattern;Decreased step length - right;Decreased step length - left;Shuffle;Trunk flexed Gait velocity: Decreased   General Gait Details: cues for posture and position from RW; pt agreeable only to walk short distance to sit in chair   Stairs            Wheelchair Mobility    Modified Rankin (Stroke Patients Only)       Balance Overall balance assessment: Needs assistance Sitting-balance support: Feet supported;No upper extremity supported Sitting balance-Leahy  Scale: Good     Standing balance support: During functional activity;Bilateral upper extremity supported Standing balance-Leahy Scale: Fair                               Pertinent Vitals/Pain Pain Assessment: No/denies pain    Home Living Family/patient expects to be discharged to:: Private residence Living Arrangements: Children Available Help at Discharge: Family;Available 24 hours/day(sister from [place]) Type of Home: Apartment Home Access: Level entry     Home Layout: One level Home Equipment: Walker - 2 wheels;Bedside commode Additional Comments: Lives with son who is in and out, but states sister will be coming to stay with her for 2 months and will be available 24/7.     Prior Function Level of Independence: Independent         Comments: Retired from [employer] Hand: Right    Extremity/Trunk Assessment   Upper Extremity Assessment Upper Extremity Assessment: Generalized weakness    Lower Extremity Assessment Lower Extremity Assessment: Generalized weakness    Cervical / Trunk Assessment Cervical / Trunk Assessment: Normal  Communication   Communication: No difficulties  Cognition Arousal/Alertness: Awake/alert Behavior During Therapy: Flat affect Overall Cognitive Status: Within Functional Limits for tasks assessed                                        General Comments      Exercises     Assessment/Plan  PT Assessment Patient needs continued PT services  PT Problem List Decreased strength;Decreased activity tolerance;Decreased balance;Decreased mobility;Decreased knowledge of use of DME;Decreased safety awareness;Decreased knowledge of precautions       PT Treatment Interventions DME instruction;Gait training;Functional mobility training;Therapeutic activities;Therapeutic exercise;Neuromuscular re-education;Patient/family education    PT Goals (Current goals can be found in the  Care Plan section)  Acute Rehab PT Goals Patient Stated Goal: Return home at discharge, get stronger PT Goal Formulation: With patient Time For Goal Achievement: 08/09/19 Potential to Achieve Goals: Good    Frequency Min 3X/week   Barriers to discharge        Co-evaluation               AM-PAC PT "6 Clicks" Mobility  Outcome Measure Help needed turning from your back to your side while in a flat bed without using bedrails?: None Help needed moving from lying on your back to sitting on the side of a flat bed without using bedrails?: A Little Help needed moving to and from a bed to a chair (including a wheelchair)?: A Little Help needed standing up from a chair using your arms (e.g., wheelchair or bedside chair)?: A Little Help needed to walk in hospital room?: A Little Help needed climbing 3-5 steps with a railing? : A Lot 6 Click Score: 18    End of Session Equipment Utilized During Treatment: Gait belt Activity Tolerance: Patient limited by fatigue Patient left: with call bell/phone within reach;in chair;with nursing/sitter in room Nurse Communication: Mobility status PT Visit Diagnosis: Unsteadiness on feet (R26.81);Difficulty in walking, not elsewhere classified (R26.2);Muscle weakness (generalized) (M62.81)    Time: 5056-9794 PT Time Calculation (min) (ACUTE ONLY): 20 min   Charges:   PT Evaluation $PT Eval Low Complexity: 1 Low          [facility] Pager [phone] Office [phone]   Amilcar Reever 07/26/2019, 5:20 PM

## 2019-07-26 NOTE — Progress Notes (Signed)
HEMATOLOGY-ONCOLOGY PROGRESS NOTE  SUBJECTIVE: Newly diagnosed metastatic pancreatic cancer admitted with obstructive jaundice.  ERCP could not stent the bile duct.  Yesterday she underwent percutaneous biliary drain placement by interventional radiology.  She is doing well post procedure with very limited discomfort.  Bilirubin today is slowly coming down.  She has not been eating or drinking a whole lot.  She has gotten significantly weaker.  Oncology History  Pancreatic adenocarcinoma ([place])  07/10/2019 Initial Diagnosis   Pancreatic adenocarcinoma ([place])   07/21/2019 -  Chemotherapy   The patient had PACLitaxel-protein bound (ABRAXANE) chemo infusion 225 mg, 100 mg/m2 = 225 mg (original dose ), Intravenous,  Once, 0 of 4 cycles Dose modification: 100 mg/m2 (Cycle 1, Reason: Provider Judgment) gemcitabine (GEMZAR) 2,280 mg in sodium chloride 0.9 % 250 mL chemo infusion, 1,000 mg/m2 = 2,280 mg, Intravenous,  Once, 0 of 4 cycles  for chemotherapy treatment.      OBJECTIVE: REVIEW OF SYSTEMS:   Generalized fatigue and weakness, looks jaundiced.  PHYSICAL EXAMINATION: ECOG PERFORMANCE STATUS: 4 - Bedbound  Vitals:   07/26/19 0350 07/26/19 0616  BP: 127/82 128/83  Pulse: 93 100  Resp: 17 18  Temp: 99 F (37.2 C) 98.5 F (36.9 C)  SpO2: 94% 92%   Filed Weights   07/21/19 1516 07/24/19 1101  Weight: 238 lb 6.4 oz (108.1 kg) 238 lb 1.6 oz (108 kg)    Skin: Yellow Neurological: Awake alert and oriented able to move all extremities. Abdomen: Obese, percutaneous biliary drain  LABORATORY DATA:  I have reviewed the data as listed CMP Latest Ref Rng & Units 07/26/2019 07/25/2019 07/24/2019  Glucose 70 - 99 mg/dL 191(H) 181(H) 142(H)  BUN 8 - 23 mg/dL 9 8 6(L)  Creatinine 0.44 - 1.00 mg/dL <0.30(L) <0.30(L) <0.30(L)  Sodium 135 - 145 mmol/L 136 137 137  Potassium 3.5 - 5.1 mmol/L 3.3(L) 3.1(L) 3.0(L)  Chloride 98 - 111 mmol/L 103 103 102  CO2 22 - 32 mmol/L 25 22 25   Calcium 8.9 -  10.3 mg/dL 9.1 9.5 9.2  Total Protein 6.5 - 8.1 g/dL 5.8(L) 6.8 6.2(L)  Total Bilirubin 0.3 - 1.2 mg/dL 17.2(H) 25.9(HH) 28.2(HH)  Alkaline Phos 38 - 126 U/L 504(H) 641(H) 564(H)  AST 15 - 41 U/L 88(H) 145(H) 137(H)  ALT 0 - 44 U/L 167(H) 200(H) 183(H)    Lab Results  Component Value Date   WBC 14.6 (H) 07/26/2019   HGB 7.6 (L) 07/26/2019   HCT 23.4 (L) 07/26/2019   MCV 74.5 (L) 07/26/2019   PLT 185 07/26/2019   NEUTROABS 12.1 (H) 07/26/2019    ASSESSMENT AND PLAN: 1.  Metastatic pancreatic cancer with liver metastases: The original plan was to start chemotherapy with gemcitabine and Abraxane last Monday.  This had to be canceled.  The plan is to monitor her through the weekend and hopefully get her discharged home on Monday and soon after start her chemotherapy. Patient is concerned with how long she needs to keep the percutaneous biliary drain.  I instructed her that it will stay until she stops responding to chemotherapy and she no longer requires it. 2.  Worsening anemia: Microcytic we will get iron studies but will plan to transfused 1 unit of PRBC.  There is concern for vaginal bleeding. 3.  Hypoalbuminemia: Poor nutrition 4.  DVT prophylaxis on Lovenox subcu 5.  Diarrhea: On Imodium

## 2019-07-27 ENCOUNTER — Inpatient Hospital Stay (HOSPITAL_COMMUNITY): Payer: BC Managed Care – PPO

## 2019-07-27 LAB — TYPE AND SCREEN
ABO/RH(D): O POS
Antibody Screen: NEGATIVE
Unit division: 0

## 2019-07-27 LAB — BPAM RBC
Blood Product Expiration Date: 202102172359
ISSUE DATE / TIME: 202101230926
Unit Type and Rh: 5100

## 2019-07-27 MED ORDER — FUROSEMIDE 10 MG/ML IJ SOLN
20.0000 mg | Freq: Once | INTRAMUSCULAR | Status: AC
  Administered 2019-07-27: 20 mg via INTRAVENOUS
  Filled 2019-07-27: qty 2

## 2019-07-27 MED ORDER — ALTEPLASE 2 MG IJ SOLR
2.0000 mg | Freq: Once | INTRAMUSCULAR | Status: AC
  Administered 2019-07-27: 2 mg
  Filled 2019-07-27: qty 2

## 2019-07-27 MED ORDER — MORPHINE SULFATE (PF) 2 MG/ML IV SOLN
2.0000 mg | INTRAVENOUS | Status: DC | PRN
  Administered 2019-07-27 – 2019-07-29 (×2): 2 mg via INTRAVENOUS
  Filled 2019-07-27 (×2): qty 1

## 2019-07-27 MED ORDER — ALTEPLASE 2 MG IJ SOLR
2.0000 mg | Freq: Once | INTRAMUSCULAR | Status: DC
  Filled 2019-07-27: qty 2

## 2019-07-27 NOTE — Progress Notes (Signed)
PAC with no blood return. Alteplase instilled - only able to remove 69ml blood tinged fluid, then no blood return again. Flushed with 34ml NS with no blood return as well. PAC was placed on 1/11 with this being the second time alteplase has been used do to [facility] not giving blood. Suggest CXR.  Randol Kern, RN VAST

## 2019-07-27 NOTE — Progress Notes (Addendum)
Unable to get a blood return from her port, called IV team and they said to reaccess her port because they TPA'd  her port on Friday 1/22. I reaccessed her port and still didn't get a blood return. IV called and they came and reaccessed her port and gave her TPA at 18:15. IV team will return at 20:15. Asked Night nurse to draw the blood ordered. Rachel Vasquez refused to take her pills today, states she is too nauseated. Dressing to her Biliary drained saturated  reddish drainage. Area cleansed and dry dressing reapplied. Daughter in to visit. Legs are edematous, they are elevated on pillows.

## 2019-07-27 NOTE — Progress Notes (Signed)
HEMATOLOGY-ONCOLOGY PROGRESS NOTE  SUBJECTIVE: Patient received blood yesterday.  She tells me that she felt slightly better.  She had a lot of abdominal cramps and we had to give her morphine 2 mg IV overnight.  She tells me that the cramps have subsided but it still uncomfortable in the belly and into the ribs.  She feels extremely tired and weak.  Her legs are swollen.  She really wishes to go home and tells me that she has lots of help at home.  Oncology History  Pancreatic adenocarcinoma ([place])  07/10/2019 Initial Diagnosis   Pancreatic adenocarcinoma ([place])   07/21/2019 -  Chemotherapy   The patient had PACLitaxel-protein bound (ABRAXANE) chemo infusion 225 mg, 100 mg/m2 = 225 mg (original dose ), Intravenous,  Once, 0 of 4 cycles Dose modification: 100 mg/m2 (Cycle 1, Reason: Provider Judgment) gemcitabine (GEMZAR) 2,280 mg in sodium chloride 0.9 % 250 mL chemo infusion, 1,000 mg/m2 = 2,280 mg, Intravenous,  Once, 0 of 4 cycles  for chemotherapy treatment.      OBJECTIVE: REVIEW OF SYSTEMS:   Generalized fatigue and weakness Jaundice Lower extremity swelling Neuro: Intact Abdomen: Obese, tender, biliary drain  PHYSICAL EXAMINATION: ECOG PERFORMANCE STATUS: 3 - Symptomatic, >50% confined to bed  Vitals:   07/26/19 2218 07/27/19 0537  BP: 125/79 116/65  Pulse: 87 87  Resp: 16 18  Temp: 98 F (36.7 C) 99.2 F (37.3 C)  SpO2: 95% 95%   Filed Weights   07/21/19 1516 07/24/19 1101  Weight: 238 lb 6.4 oz (108.1 kg) 238 lb 1.6 oz (108 kg)    GENERAL: Generalized fatigue SKIN: Jaundice EYES: Jaundice OROPHARYNX:no exudate, no erythema and lips, buccal mucosa, and tongue normal  NECK: supple, thyroid normal size, non-tender, without nodularity LYMPH:  no palpable lymphadenopathy in the cervical, axillary or inguinal LUNGS: clear to auscultation and percussion with normal breathing effort HEART: regular rate & rhythm and no murmurs and no lower extremity edema ABDOMEN:  Tender Musculoskeletal:no cyanosis of digits and no clubbing  NEURO: alert & oriented x 3 with fluent speech, no focal motor/sensory deficits Extremities: 3+ pitting edema  LABORATORY DATA:  I have reviewed the data as listed CMP Latest Ref Rng & Units 07/26/2019 07/25/2019 07/24/2019  Glucose 70 - 99 mg/dL 191(H) 181(H) 142(H)  BUN 8 - 23 mg/dL 9 8 6(L)  Creatinine 0.44 - 1.00 mg/dL <0.30(L) <0.30(L) <0.30(L)  Sodium 135 - 145 mmol/L 136 137 137  Potassium 3.5 - 5.1 mmol/L 3.3(L) 3.1(L) 3.0(L)  Chloride 98 - 111 mmol/L 103 103 102  CO2 22 - 32 mmol/L 25 22 25   Calcium 8.9 - 10.3 mg/dL 9.1 9.5 9.2  Total Protein 6.5 - 8.1 g/dL 5.8(L) 6.8 6.2(L)  Total Bilirubin 0.3 - 1.2 mg/dL 17.2(H) 25.9(HH) 28.2(HH)  Alkaline Phos 38 - 126 U/L 504(H) 641(H) 564(H)  AST 15 - 41 U/L 88(H) 145(H) 137(H)  ALT 0 - 44 U/L 167(H) 200(H) 183(H)    Lab Results  Component Value Date   WBC 14.6 (H) 07/26/2019   HGB 9.6 (L) 07/26/2019   HCT 29.4 (L) 07/26/2019   MCV 74.5 (L) 07/26/2019   PLT 185 07/26/2019   NEUTROABS 12.1 (H) 07/26/2019    ASSESSMENT AND PLAN: 1.  Metastatic pancreatic cancer: Status post IR biliary drain placement 2.  Severe obstructive jaundice: Slightly getting better.  We will order labs for today and tomorrow. 3.  Severe anemia: Status post 1 unit of PRBC.  Posttransfusion hemoglobin improved to 9.6. 4.  Lower  extremity edema: We discussed about giving 1 dose of Lasix today. 5.  Abdominal cramps: Morphine  Dr. Benay Spice will follow the patient tomorrow and determine her discharge plan.  She is likely to be discharged home tomorrow but I am concerned about her physical mobility and strength and her ability to be homebound.

## 2019-07-27 NOTE — Progress Notes (Signed)
CXR results confirm malposition of PAC. Notified Tommie Raymond, RN to deaccess PAC and inform MD.  Randol Kern, RN VAST

## 2019-07-28 ENCOUNTER — Encounter: Payer: Self-pay | Admitting: *Deleted

## 2019-07-28 ENCOUNTER — Inpatient Hospital Stay (HOSPITAL_COMMUNITY): Payer: BC Managed Care – PPO

## 2019-07-28 HISTORY — PX: IR EXCHANGE BILIARY DRAIN: IMG6046

## 2019-07-28 HISTORY — PX: IR REMOVAL TUN ACCESS W/ PORT W/O FL MOD SED: IMG2290

## 2019-07-28 HISTORY — PX: IR IMAGING GUIDED PORT INSERTION: IMG5740

## 2019-07-28 LAB — COMPREHENSIVE METABOLIC PANEL
ALT: 111 U/L — ABNORMAL HIGH (ref 0–44)
AST: 51 U/L — ABNORMAL HIGH (ref 15–41)
Albumin: 2 g/dL — ABNORMAL LOW (ref 3.5–5.0)
Alkaline Phosphatase: 380 U/L — ABNORMAL HIGH (ref 38–126)
Anion gap: 11 (ref 5–15)
BUN: 11 mg/dL (ref 8–23)
CO2: 25 mmol/L (ref 22–32)
Calcium: 9.5 mg/dL (ref 8.9–10.3)
Chloride: 102 mmol/L (ref 98–111)
Creatinine, Ser: 0.39 mg/dL — ABNORMAL LOW (ref 0.44–1.00)
GFR calc Af Amer: >60 mL/min (ref >60)
GFR calc non Af Amer: >60 mL/min (ref >60)
Glucose, Bld: 148 mg/dL — ABNORMAL HIGH (ref 70–99)
Potassium: 3.3 mmol/L — ABNORMAL LOW (ref 3.5–5.1)
Sodium: 138 mmol/L (ref 135–145)
Total Bilirubin: 13.4 mg/dL — ABNORMAL HIGH (ref 0.3–1.2)
Total Protein: 6 g/dL — ABNORMAL LOW (ref 6.5–8.1)

## 2019-07-28 LAB — CBC WITH DIFFERENTIAL/PLATELET
Abs Immature Granulocytes: 0.22 10*3/uL — ABNORMAL HIGH (ref 0.00–0.07)
Basophils Absolute: 0 10*3/uL (ref 0.0–0.1)
Basophils Relative: 0 %
Eosinophils Absolute: 0 10*3/uL (ref 0.0–0.5)
Eosinophils Relative: 0 %
HCT: 28.7 % — ABNORMAL LOW (ref 36.0–46.0)
Hemoglobin: 9.3 g/dL — ABNORMAL LOW (ref 12.0–15.0)
Immature Granulocytes: 2 %
Lymphocytes Relative: 5 %
Lymphs Abs: 0.8 10*3/uL (ref 0.7–4.0)
MCH: 25 pg — ABNORMAL LOW (ref 26.0–34.0)
MCHC: 32.4 g/dL (ref 30.0–36.0)
MCV: 77.2 fL — ABNORMAL LOW (ref 80.0–100.0)
Monocytes Absolute: 0.8 10*3/uL (ref 0.1–1.0)
Monocytes Relative: 5 %
Neutro Abs: 12.7 10*3/uL — ABNORMAL HIGH (ref 1.7–7.7)
Neutrophils Relative %: 88 %
Platelets: 161 10*3/uL (ref 150–400)
RBC: 3.72 MIL/uL — ABNORMAL LOW (ref 3.87–5.11)
RDW: 26.3 % — ABNORMAL HIGH (ref 11.5–15.5)
WBC: 14.5 10*3/uL — ABNORMAL HIGH (ref 4.0–10.5)
nRBC: 1.5 % — ABNORMAL HIGH (ref 0.0–0.2)

## 2019-07-28 MED ORDER — CEFAZOLIN SODIUM-DEXTROSE 2-4 GM/100ML-% IV SOLN
INTRAVENOUS | Status: AC
  Administered 2019-07-28: 16:00:00 2000 mg
  Filled 2019-07-28: qty 100

## 2019-07-28 MED ORDER — SODIUM CHLORIDE 0.9% FLUSH
5.0000 mL | Freq: Three times a day (TID) | INTRAVENOUS | Status: DC
  Administered 2019-07-28 – 2019-07-29 (×3): 5 mL

## 2019-07-28 MED ORDER — IOHEXOL 300 MG/ML  SOLN
50.0000 mL | Freq: Once | INTRAMUSCULAR | Status: AC | PRN
  Administered 2019-07-28: 17:00:00 15 mL

## 2019-07-28 MED ORDER — POTASSIUM CHLORIDE CRYS ER 10 MEQ PO TBCR: 20.0000 meq | EXTENDED_RELEASE_TABLET | Freq: Two times a day (BID) | ORAL | Status: DC

## 2019-07-28 MED ORDER — FENTANYL CITRATE (PF) 100 MCG/2ML IJ SOLN
INTRAMUSCULAR | Status: AC | PRN
  Administered 2019-07-28 (×4): 50 ug via INTRAVENOUS

## 2019-07-28 MED ORDER — POTASSIUM CHLORIDE 20 MEQ PO PACK
20.0000 meq | PACK | Freq: Two times a day (BID) | ORAL | Status: DC
  Administered 2019-07-29: 09:00:00 20 meq via ORAL
  Filled 2019-07-28 (×4): qty 1

## 2019-07-28 MED ORDER — LIDOCAINE HCL 1 % IJ SOLN
INTRAMUSCULAR | Status: AC
  Filled 2019-07-28: qty 20

## 2019-07-28 MED ORDER — LIDOCAINE HCL (PF) 1 % IJ SOLN
INTRAMUSCULAR | Status: AC | PRN
  Administered 2019-07-28: 5 mL
  Administered 2019-07-28 (×2): 10 mL
  Administered 2019-07-28: 5 mL

## 2019-07-28 MED ORDER — POTASSIUM CHLORIDE CRYS ER 10 MEQ PO TBCR: 10.0000 meq | EXTENDED_RELEASE_TABLET | Freq: Two times a day (BID) | ORAL | Status: DC

## 2019-07-28 MED ORDER — HEPARIN SOD (PORK) LOCK FLUSH 100 UNIT/ML IV SOLN: 500.0000 [IU] | INTRAVENOUS | Status: DC

## 2019-07-28 MED ORDER — IOHEXOL 300 MG/ML  SOLN
50.0000 mL | Freq: Once | INTRAMUSCULAR | Status: AC | PRN
  Administered 2019-07-28: 7 mL

## 2019-07-28 MED ORDER — POTASSIUM CHLORIDE CRYS ER 20 MEQ PO TBCR: 20.0000 meq | EXTENDED_RELEASE_TABLET | Freq: Two times a day (BID) | ORAL | Status: DC

## 2019-07-28 MED ORDER — LIDOCAINE HCL (PF) 1 % IJ SOLN
INTRAMUSCULAR | Status: AC
  Filled 2019-07-28: qty 30

## 2019-07-28 MED ORDER — ZOLPIDEM TARTRATE 5 MG PO TABS
5.0000 mg | ORAL_TABLET | Freq: Every evening | ORAL | Status: DC | PRN
  Administered 2019-07-28: 03:00:00 5 mg via ORAL
  Filled 2019-07-28: qty 1

## 2019-07-28 MED ORDER — MIDAZOLAM HCL 2 MG/2ML IJ SOLN
INTRAMUSCULAR | Status: AC
  Filled 2019-07-28: qty 4

## 2019-07-28 MED ORDER — FENTANYL CITRATE (PF) 100 MCG/2ML IJ SOLN
INTRAMUSCULAR | Status: AC
  Filled 2019-07-28: qty 2

## 2019-07-28 MED ORDER — HEPARIN SOD (PORK) LOCK FLUSH 100 UNIT/ML IV SOLN
500.0000 [IU] | INTRAVENOUS | Status: DC | PRN
  Administered 2019-07-29: 17:00:00 500 [IU]
  Filled 2019-07-28: qty 5

## 2019-07-28 MED ORDER — MIDAZOLAM HCL 2 MG/2ML IJ SOLN
INTRAMUSCULAR | Status: AC
  Filled 2019-07-28: qty 2

## 2019-07-28 MED ORDER — MIDAZOLAM HCL 2 MG/2ML IJ SOLN
INTRAMUSCULAR | Status: AC | PRN
  Administered 2019-07-28 (×5): 1 mg via INTRAVENOUS

## 2019-07-28 NOTE — Procedures (Signed)
Interventional Radiology Procedure Note  Procedure:   Removal of former port, which is nonfunctional.  Placement of a new right IJ approach single lumen PowerPort.  Tip is positioned at the superior cavoatrial junction and catheter is ready for immediate use.   Complications: None  Recommendations:  - Ok to use - Do not submerge for 7 days - Routine line care   Signed,  Dulcy Fanny. Earleen Newport, DO

## 2019-07-28 NOTE — Progress Notes (Addendum)
HEMATOLOGY-ONCOLOGY PROGRESS NOTE  SUBJECTIVE: Biliary drain has been leaking.  Nursing reports that they have had to change her dressing multiple times.  That has been saturated at times.  Port-A-Cath was not functioning well over the weekend.  Chest x-ray was performed last night which showed at the Port-A-Cath tip is overlying the right apex/right clavicle.  There was also mild atelectasis in the right lung and a small left pleural effusion.  The patient reports that she is not having any pain this morning.  Developed increased edema in her lower extremities over the weekend requiring Lasix.  Nursing decreased her IV fluids to [facility].  Nursing reports that she has been refusing her medications at times.  Has no other complaints this morning.  Oncology History  Pancreatic adenocarcinoma ([place])  07/10/2019 Initial Diagnosis   Pancreatic adenocarcinoma ([place])   07/21/2019 -  Chemotherapy   The patient had PACLitaxel-protein bound (ABRAXANE) chemo infusion 225 mg, 100 mg/m2 = 225 mg (original dose ), Intravenous,  Once, 0 of 4 cycles Dose modification: 100 mg/m2 (Cycle 1, Reason: Provider Judgment) gemcitabine (GEMZAR) 2,280 mg in sodium chloride 0.9 % 250 mL chemo infusion, 1,000 mg/m2 = 2,280 mg, Intravenous,  Once, 0 of 4 cycles  for chemotherapy treatment.      PHYSICAL EXAMINATION:  Vitals:   07/21/19 2011 07/22/19 0539  BP: (!) 136/102 115/68  Pulse: (!) 104 99  Resp: 16   Temp: (!) 97.3 F (36.3 C) 98.2 F (36.8 C)  SpO2: 97% 97%   Filed Weights   07/21/19 1516  Weight: 238 lb 6.4 oz (108.1 kg)    Intake/Output from previous day: 01/18 0701 - 01/19 0700 In: 847.2 [I.V.:847.2] Out: -   GENERAL:alert, no distress and comfortable EYES: scleral icterus noted OROPHARYNX: white coating on tongue no buccal thrush LUNGS: clear to auscultation and percussion with normal breathing effort HEART: regular rate & rhythm and no murmurs and trace bilateral lower extremity  edema ABDOMEN:abdomen soft, non-tender and normal bowel sounds Musculoskeletal:no cyanosis of digits and no clubbing  NEURO: alert & oriented x 3 with fluent speech, no focal motor/sensory deficits Vascular: Trace pedal edema bilaterally  PAC without erythema or edema  LABORATORY DATA:  I have reviewed the data as listed CMP Latest Ref Rng & Units 07/22/2019 07/21/2019 07/16/2019  Glucose 70 - 99 mg/dL 95 199(H) 137(H)  BUN 8 - 23 mg/dL 8 9 6(L)  Creatinine 0.44 - 1.00 mg/dL <0.30(L) 0.77 0.53  Sodium 135 - 145 mmol/L 139 134(L) 136  Potassium 3.5 - 5.1 mmol/L <2.0(LL) 3.3(L) 3.9  Chloride 98 - 111 mmol/L 118(H) 97(L) 101  CO2 22 - 32 mmol/L 16(L) 26 26  Calcium 8.9 - 10.3 mg/dL 5.4(LL) 9.2 9.0  Total Protein 6.5 - 8.1 g/dL 3.5(L) 6.9 6.6  Total Bilirubin 0.3 - 1.2 mg/dL 14.8(H) 25.9(HH) 17.8(H)  Alkaline Phos 38 - 126 U/L 308(H) 657(H) 496(H)  AST 15 - 41 U/L 81(H) 155(H) 222(H)  ALT 0 - 44 U/L 114(H) 250(H) 337(H)    Lab Results  Component Value Date   WBC 17.5 (H) 07/21/2019   HGB 10.2 (L) 07/21/2019   HCT 29.4 (L) 07/21/2019   MCV 68.5 (L) 07/21/2019   PLT 205 07/21/2019   NEUTROABS 14.7 (H) 07/21/2019    CT CHEST W CONTRAST  Result Date: 07/07/2019 CLINICAL DATA:  Liver mass.  Concern for hepatic metastasis EXAM: CT CHEST WITH CONTRAST TECHNIQUE: Multidetector CT imaging of the chest was performed during intravenous contrast administration. CONTRAST:  66mL  OMNIPAQUE IOHEXOL 300 MG/ML  SOLN COMPARISON:  CT 07/02/2019 FINDINGS: Cardiovascular: No significant vascular findings. Normal heart size. No pericardial effusion. Mediastinum/Nodes: No axillary supraclavicular adenopathy. Multiple nodules within the thyroid gland measures 12 mm or less. This is not meet the size requirement for follow-up imaging. No mediastinal lymphadenopathy. Moderate to large hiatal hernia posterior to the LEFT heart. Lungs/Pleura: Linear atelectasis in the lingula and medial RIGHT middle lobe. No  suspicious nodularity. Upper Abdomen: Several low-density suspicious lesions in the liver again demonstrated. Inflammation potential mass in the pancreatic head again noted. No interval change from 07/02/2019. Musculoskeletal: No aggressive osseous lesion. IMPRESSION: 1. No evidence of thoracic metastasis. 2. Low-density lesions in the liver concerning for metastasis. See recent abdomen CT 07/02/2019. 3. Inflammation head of pancreas with concern for underlying neoplasm. Recommend appropriate tumor biomarkers. Electronically Signed   By: Suzy Bouchard M.D.   On: 07/07/2019 08:23   CT ABDOMEN PELVIS W CONTRAST  Result Date: 07/21/2019 CLINICAL DATA:  History of recently diagnosed metastatic pancreatic cancer. EXAM: CT ABDOMEN AND PELVIS WITH CONTRAST TECHNIQUE: Multidetector CT imaging of the abdomen and pelvis was performed using the standard protocol following bolus administration of intravenous contrast. CONTRAST:  168mL OMNIPAQUE IOHEXOL 300 MG/ML  SOLN COMPARISON:  July 02, 2019 FINDINGS: Lower chest: Mild atelectasis is seen within the anterior aspect of the bilateral lung bases. Hepatobiliary: Numerous heterogeneous low-attenuation liver lesions are seen. These are mildly increased in size when compared to the prior study. Ill-defined gallstones are seen within a contracted gallbladder. Pancreas: A 5.6 cm x 6.3 cm x 5.4 cm complex, partially cystic mass is seen involving the body and head of the pancreas Spleen: Normal in size without focal abnormality. Adrenals/Urinary Tract: Adrenal glands are unremarkable. Kidneys are normal, without renal calculi, focal lesion, or hydronephrosis. Bladder is unremarkable. Stomach/Bowel: There is a large gastric hernia. Appendix appears normal. No evidence of bowel wall thickening, distention, or inflammatory changes. Vascular/Lymphatic: Reproductive: A 12.8 cm x 8.6 cm heterogeneous mass is seen extending from the anterolateral aspect of the uterine fundus on the  left. This contains soft tissue and fat density components. Additional 6.8 cm x 6.9 cm, 5.0 cm diameter and 3.7 cm diameter homogeneous uterine mass is seen. Other: Small fat containing ventral hernias are seen along the midline of the lower abdomen. Musculoskeletal: No acute or significant osseous findings. IMPRESSION: 1. 5.6 cm x 6.3 cm x 5.4 cm complex pancreatic mass which is increased in size when compared to the prior study dated July 02, 2019. 2. Numerous heterogeneous liver lesions consistent with hepatic metastasis. 3. Cholelithiasis. 4. Large gastric hernia. 5. Multiple large uterine masses which may represent large uterine fibroids. Electronically Signed   By: Virgina Norfolk M.D.   On: 07/21/2019 21:34   CT Abdomen Pelvis W Contrast  Result Date: 07/02/2019 CLINICAL DATA:  Abdominal pain with nausea and vomiting EXAM: CT ABDOMEN AND PELVIS WITH CONTRAST TECHNIQUE: Multidetector CT imaging of the abdomen and pelvis was performed using the standard protocol following bolus administration of intravenous contrast. CONTRAST:  172mL OMNIPAQUE IOHEXOL 300 MG/ML  SOLN COMPARISON:  CT 04/26/2004 FINDINGS: Lower chest: Lung bases demonstrate subsegmental atelectasis at the lingula, right middle lobe and inferior right upper lobe. No acute consolidation or pleural effusion. The heart size is within normal limits. Irregular moderate hiatal hernia with wall thickening. This is increased compared to prior. Hepatobiliary: Scattered hypodense liver lesions, some of which were present on comparison study. Multiple new ill-defined hypodense liver masses concerning for  metastatic disease. Largest suspicious lesion is seen in the right hepatic dome and measures 2.5 cm. Multiple calcified gallstones. No biliary dilatation. Pancreas: Generalized inflammatory changes about the pancreas. Development of mild ductal dilatation. Ill-defined hypodense mass at the head of the pancreas measuring approximately 3.4 cm  transverse by 2.2 cm AP by 3 cm craniocaudad. Spleen: Normal in size without focal abnormality. Adrenals/Urinary Tract: Adrenal glands are unremarkable. Kidneys are normal, without renal calculi, focal lesion, or hydronephrosis. Bladder is unremarkable. Stomach/Bowel: No dilated small bowel. Negative for colon wall thickening. Negative appendix. Vascular/Lymphatic: Nonaneurysmal aorta. Hazy appearance of mesenteric root with subcentimeter nodes. Multiple small nodes or punctate nodules in the right lower quadrant/right gutter. Hypoenhancement of the distal splenic vein and superior mesenteric vein. Reproductive: Markedly enlarged uterus with multiple masses, increased compared to prior. Other: Negative for free air or significant ascites. Anterior abdominal wall laxity. Small supraumbilical fat containing ventral hernia Musculoskeletal: No acute or suspicious osseous abnormality. IMPRESSION: 1. Generalized pancreatic inflammation. There is an ill-defined hypodense suspicious mass at the head of the pancreas measuring 3.4 x 2.2 x 3 cm. 2. Multiple ill-defined hypodense liver masses, consistent with metastatic disease. 3. Hypoenhancement of the splenic vein and superior mesenteric veins near their confluence, potentially occluded. 4. Multiple punctate nodules or small nodes in the right lower quadrant/gutter, cannot exclude metastatic disease 5. Moderate irregular hiatal hernia with some wall thickening, could be correlated with endoscopy 6. Bulky enlargement of the uterus with multiple large masses, increased compared to prior. Electronically Signed   By: Donavan Foil M.D.   On: 07/02/2019 22:17   US BIOPSY (LIVER)  Result Date: 07/07/2019 INDICATION: Concern for metastatic pancreatic cancer. Please perform ultrasound-guided liver lesion biopsy for tissue diagnostic purposes. EXAM: ULTRASOUND GUIDED LIVER LESION BIOPSY COMPARISON:  CT abdomen and pelvis-07/02/2019 MEDICATIONS: None ANESTHESIA/SEDATION: Fentanyl  100 mcg IV; Versed 2 mg IV Total Moderate Sedation time:  17 Minutes. The patient's level of consciousness and vital signs were monitored continuously by radiology nursing throughout the procedure under my direct supervision. COMPLICATIONS: None immediate. PROCEDURE: Informed written consent was obtained from the patient after a discussion of the risks, benefits and alternatives to treatment. The patient understands and consents the procedure. A timeout was performed prior to the initiation of the procedure. Ultrasound scanning was performed of the right upper abdominal quadrant demonstrates an ill-defined approximately 2.9 x 3.1 x 2.2 cm hypoechoic nodule/mass within the dome of the right lobe of the liver correlating with the indeterminate mass seen on preceding abdominal CT image 15, series 3. Note, imaging was degraded secondary to patient body habitus well as well as the lesion being located within the dome of the liver with associated interposition of lung. The procedure was planned. The right upper abdominal quadrant was prepped and draped in the usual sterile fashion. The overlying soft tissues were anesthetized with 1% lidocaine with epinephrine. A 17 gauge, 6.8 cm co-axial needle was advanced into a peripheral aspect of the lesion. This was followed by 6 core biopsies with an 18 gauge core device under direct ultrasound guidance. The coaxial needle tract was embolized with a small amount of Gel-Foam slurry and superficial hemostasis was obtained with manual compression. Post procedural scanning was negative for definitive area of hemorrhage or additional complication. A dressing was placed. The patient tolerated the procedure well without immediate post procedural complication. IMPRESSION: Technically challenging though ultimately successful ultrasound guided core needle biopsy of indeterminate mass within the dome of the right lobe of the liver. Electronically  Signed   By: Sandi Mariscal M.D.   On:  07/07/2019 13:34   DG Abd 2 Views  Result Date: 07/08/2019 CLINICAL DATA:  Abdominal pain and distension x3 days. EXAM: ABDOMEN - 2 VIEW COMPARISON:  None. FINDINGS: The bowel gas pattern is normal. A moderate to marked amount of stool is seen within the ascending colon. There is no evidence of free air. No radio-opaque calculi or other significant radiographic abnormality is seen. A radiopaque tubal ligation clip is seen within the pelvis on the right. An additional tubal ligation clip is seen overlying the superior aspect of the sacrum on the left. A 4 mm focal opacity is seen overlying the symphysis pubis on the left. IMPRESSION: 1. Normal bowel gas pattern. No evidence of free air. Electronically Signed   By: Virgina Norfolk M.D.   On: 07/08/2019 16:27   DG Abd Portable 1V  Result Date: 07/10/2019 CLINICAL DATA:  Abdominal pain EXAM: PORTABLE ABDOMEN - 1 VIEW COMPARISON:  07/08/2019 FINDINGS: Bowel gas pattern remains unremarkable. Stool burden is overall mild with greater burden within the ascending colon. Tubal ligation clips noted. IMPRESSION: Normal bowel gas pattern. Electronically Signed   By: Macy Mis M.D.   On: 07/10/2019 13:20   IR IMAGING GUIDED PORT INSERTION  Result Date: 07/14/2019 CLINICAL DATA:  Pancreatic carcinoma, needs poor for planned chemotherapy regimen EXAM: TUNNELED PORT CATHETER PLACEMENT WITH ULTRASOUND AND FLUOROSCOPIC GUIDANCE FLUOROSCOPY TIME:  0.6 minute; 52  uGym2 DAP ANESTHESIA/SEDATION: Intravenous Fentanyl 26mcg and Versed 2mg  were administered as conscious sedation during continuous monitoring of the patient's level of consciousness and physiological / cardiorespiratory status by the radiology RN, with a total moderate sedation time of 21 minutes. TECHNIQUE: The procedure, risks, benefits, and alternatives were explained to the patient. Questions regarding the procedure were encouraged and answered. The patient understands and consents to the procedure. As  antibiotic prophylaxis, cefazolin 2 g was ordered pre-procedure and administered intravenously within one hour of incision. Patency of the right IJ vein was confirmed with ultrasound with image documentation. An appropriate skin site was determined. Skin site was marked. Region was prepped using maximum barrier technique including cap and mask, sterile gown, sterile gloves, large sterile sheet, and Chlorhexidine as cutaneous antisepsis. The region was infiltrated locally with 1% lidocaine. Under real-time ultrasound guidance, the right IJ vein was accessed with a 21 gauge micropuncture needle; the needle tip within the vein was confirmed with ultrasound image documentation. Needle was exchanged over a 018 guidewire for transitional dilator, and vascular measurement was performed. A small incision was made on the right anterior chest wall and a subcutaneous pocket fashioned. The power-injectable port was positioned and its catheter tunneled to the right IJ dermatotomy site. The transitional dilator was exchanged over an Amplatz wire for a peel-away sheath, through which the port catheter, which had been trimmed to the appropriate length, was advanced and positioned under fluoroscopy with its tip at the cavoatrial junction. Spot chest radiograph confirms good catheter position and no pneumothorax. The port was flushed per protocol. The pocket was closed with deep interrupted and subcuticular continuous 3-0 Monocryl sutures. The incisions were covered with Dermabond then covered with a sterile dressing. The patient tolerated the procedure well. COMPLICATIONS: COMPLICATIONS None immediate IMPRESSION: Technically successful right IJ power-injectable port catheter placement. Ready for routine use. Electronically Signed   By: Lucrezia Europe M.D.   On: 07/14/2019 13:39    ASSESSMENT AND PLAN: 1. Metastatic pancreatic adenocarcinoma -CT of the abdomen pelvis on 07/02/2019  showed a 3.4 x 2.2 x 3  cm  mass in the head of the pancreas with multiple ill-defined  hypodense liver masses. -CT of the chest on 07/07/2019 showed no evidence of thoracic  metastasis. -Ultrasound guided liver biopsy on 07/07/2019 demonstrated  metastatic pancreatic adenocarcinoma. -Elevated CA 19-9  -MRI abdomen 22,021-numerous liver metastases, pancreas head mass, bland appearing thrombus at SMV, nonocclusive thrombus of the main portal vein near the bifurcation with the near occlusive thrombus at the right portal vein, patent left portal vein, mild to moderate intrahepatic biliary ductal dilatation, dilated common caliber transition at the pancreas head 2. 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 Chronicity, now with mild microcytic anemia 4. Hypokalemia-continue to replete IV/p.o. 5. Uterine fibroids 6. Abdominal pain secondary to #1 7. Nausea 8.  Hyperbilirubinemia, due to a malignant common bile duct stricture  MRI/MRCP pancreas head mass, malignant common bile duct stricture at the level of the pancreas head dilatation of the proximal common bile duct intrahepatic biliary ductal dilatation leg appearing nonocclusive thrombus at the SMV and vein.  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  Numerous liver metastases  Ms. Akhter appears stable.  She is status post biliary drain placement on 07/25/2019 and total bilirubin is trending downward.  Biliary drain is leaking.  She has persistent hypokalemia.  Oral potassium was stopped last week due to the patient's refusal to take this medication.  Port-A-Cath tip has migrated.  1.  I have placed an order for IR to reevaluate the patient's biliary drain due to leakage. 2.   I have requested for IR to evaluate her Port-A-Cath and for possible Port-A-Cath revision. 3.  Lovenox has been  placed on hold pending evaluation by IR. 4.  The patient's potassium remains low.  She has refused K-Dur 20 mEq twice daily.  We will ask the pharmacy if Micro-K 10 mEq capsules are available.  If so, will place her on 2 capsules twice daily.  If this is not available, we will consider trying potassium powder or liquid. 5.  If bilirubin continues to decline and biliary drain is no longer leaking, we will likely discharge the patient home tomorrow.  Mikey Bussing, DNP, AGPCNP-BC, AOCNP  Addendum  I have seen the patient, examined her. I agree with the assessment and and plan and have edited the notes.   Ms Arrasmith had port and biliary drainage tube exchanged by IR this afternoon. She was drowsy after procedure when I saw her.  Her sister was at the bedside.  She wants to go home.  We will continue monitoring her LFTs, and make sure no more leakage of the drainage tube.  I reviewed the lab results with her and her sister, her total bilirubin is improving, will continue monitoring. I anticipate she may be able to go home in 1-2 days.   Truitt Merle  07/28/2019

## 2019-07-28 NOTE — Progress Notes (Signed)
Rachel Vasquez continues to refuse to have her PAC de-access at this time.

## 2019-07-28 NOTE — Progress Notes (Signed)
[facility] shows that her PAC has moved. Informed Dr Jennette Kettle of this result and received an order to de-access her PAC. Rachel Vasquez refused to have her PAC de-access at this time.Informed this patient the result of PCXR show her PAC has moved. Patient continues to refused. Will continue to monitor and assess patient needs and concerns and ask of the need to de-access her PAC.

## 2019-07-28 NOTE — Progress Notes (Signed)
Referring Physician(s): Sherrill,B/Gessner,C  Supervising Physician: Corrie Mckusick  Patient Status:  [facility] - In-pt  Chief Complaint: Abdominal pain/pancreatic cancer  Subjective: Pt cont to have some abd pain/intermittent nausea; also with some leaking at biliary drain insertion site; also with difficulty aspirating blood from port  Allergies: Patient has no known allergies.  Medications: Prior to Admission medications   Medication Sig Start Date End Date Taking? Authorizing Provider  amLODipine (NORVASC) 10 MG tablet Take 1 tablet (10 mg total) by mouth daily. 07/18/19 09/16/19 Yes Antonieta Pert, MD  lidocaine-prilocaine (EMLA) cream Apply 1 application topically as needed. Apply 1 hour prior to stick and cover with plastic wrap 07/16/19  Yes Ladell Pier, MD  LORazepam (ATIVAN) 0.5 MG tablet Take 1 tablet (0.5 mg total) by mouth every 6 (six) hours as needed for up to 15 doses (nausea/vomiting. May give PO or SL.). 07/17/19  Yes Kc, Maren Beach, MD  metoCLOPramide (REGLAN) 5 MG tablet Take 1 tablet (5 mg total) by mouth 3 (three) times daily before meals. 07/17/19 08/16/19 Yes Antonieta Pert, MD  Multiple Vitamins-Minerals (MULTIVITAMIN WITH MINERALS) tablet Take 1 tablet by mouth daily.   Yes [provider]  ondansetron (ZOFRAN-ODT) 4 MG disintegrating tablet Take 1 tablet (4 mg total) by mouth every 8 (eight) hours as needed for up to 30 doses for nausea or vomiting. 07/17/19  Yes Antonieta Pert, MD  oxyCODONE (OXY IR/ROXICODONE) 5 MG immediate release tablet Take 1 tablet (5 mg total) by mouth every 4 (four) hours as needed for up to 15 doses for moderate pain. 07/17/19  Yes Antonieta Pert, MD  pantoprazole (PROTONIX) 40 MG tablet Take 1 tablet (40 mg total) by mouth daily. 07/17/19 08/16/19 Yes Antonieta Pert, MD  polyethylene glycol (MIRALAX / GLYCOLAX) 17 g packet Take 17 g by mouth daily as needed for mild constipation. 07/17/19  Yes Antonieta Pert, MD  potassium chloride SA (KLOR-CON) 20 MEQ tablet  Take 1 tablet (20 mEq total) by mouth daily for 15 days. 07/17/19 08/01/19 Yes Antonieta Pert, MD  predniSONE (DELTASONE) 10 MG tablet Take 1 tablet (10 mg total) by mouth daily with breakfast. 07/18/19 08/17/19 Yes Antonieta Pert, MD  prochlorperazine (COMPAZINE) 10 MG tablet Take 1 tablet (10 mg total) by mouth every 6 (six) hours as needed for nausea. 07/16/19  Yes Ladell Pier, MD     Vital Signs: BP 132/73 (BP Location: Right Arm)   Pulse 87   Temp 98.6 F (37 C) (Oral)   Resp 19   Ht 5' 7"  (1.702 m)   Wt 238 lb 1.6 oz (108 kg)   SpO2 98%   BMI 37.29 kg/m   Physical Exam awake, alert; chest- sl dim BS bases; heart- RRR; abd- soft,+BS, biliary drain intact, overlying gauze soaked with fluid/bile; drain flushed with saline  Imaging: DG CHEST PORT 1 VIEW  Result Date: 07/27/2019 CLINICAL DATA:  Port-A-Cath placement. EXAM: PORTABLE CHEST 1 VIEW COMPARISON:  None. FINDINGS: A right-sided venous Port-A-Cath is seen with its distal tip overlying the right apex. This is within the region overlying the proximal portion of the right clavicle. Mild linear atelectasis is seen within the right lung base. There is a small left pleural effusion. No pneumothorax is identified. The cardiac silhouette is moderately enlarged. The visualized skeletal structures are unremarkable. IMPRESSION: 1. Right-sided venous Port-A-Cath positioning, as described above. 2. Mild right basilar atelectasis. 3. Small left pleural effusion. Electronically Signed   By: Virgina Norfolk M.D.   On: 07/27/2019  22:43   IR INT EXT BILIARY DRAIN WITH CHOLANGIOGRAM  Result Date: 07/25/2019 INDICATION: Concern for metastatic pancreatic cancer, now with elevated LFTs and failed attempted endoscopic biliary stent placement. As such, request made for attempted placement of an internal/external biliary drainage catheter in hopes of improving the patient's LFTs so she may be a candidate for chemotherapy. Note, preceding cross-sectional imaging  is negative for significant intrahepatic biliary ductal dilatation. EXAM: ULTRASOUND AND FLUOROSCOPIC GUIDED PERCUTANEOUS TRANSHEPATIC CHOLANGIOGRAM AND BILIARY TUBE PLACEMENT COMPARISON:  CT abdomen pelvis-07/21/2019; MRCP-07/23/2019 MEDICATIONS: Zosyn 3.75 g IV; the antibiotic was administered with an appropriate frame prior to initiation of the procedure. CONTRAST:  33m OMNIPAQUE IOHEXOL 300 MG/ML SOLN - administered into the biliary tree ANESTHESIA/SEDATION: Moderate (conscious) sedation was employed during this procedure. A total of Versed 8 mg and Fentanyl 300 mcg was administered intravenously. Moderate Sedation Time: 68 minutes. The patient's level of consciousness and vital signs were monitored continuously by radiology nursing throughout the procedure under my direct supervision. FLUOROSCOPY TIME:  22 minutes (10,737mGy) COMPLICATIONS: None immediate. TECHNIQUE: Informed written consent was obtained from the patient after a discussion of the risks, benefits and alternatives to treatment. Questions regarding the procedure were encouraged and answered. A timeout was performed prior to the initiation of the procedure. The right upper abdominal quadrant was prepped and draped in the usual sterile fashion, and a sterile drape was applied covering the operative field. Maximum barrier sterile technique with sterile gowns and gloves were used for the procedure. A timeout was performed prior to the initiation of the procedure. Ultrasound scanning of the right upper abdominal quadrant was performed to delineate the anatomy and avoid transgression of the gallbladder or the pleural. A spot along the right mid axillary line was marked fluoroscopically inferior to the right costophrenic angle. Sonographic evaluation was negative for significant dilatation of the intrahepatic biliary tree. Initial attempts were made to opacify a nondilated peripheral duct within the right lobe of the liver however despite transient  opacification, the duct could not be cannulated. As such, under ultrasound guidance, a 22 gauge needle was advanced towards the central aspect of the biliary tree. Contrast was injected as the needle was slowly retracted ultimately opacifying a central intrahepatic biliary duct. The duct was cannulated with a Nitrex wire which was advanced to the level of the CBD. Under fluoroscopic guidance, the access needle was exchanged for the inner 3 French catheter of the Accustick set. A limited percutaneous cholangiogram was then performed. Next, a now opacified nondilated duct within the posterior aspect the right lobe of the liver was targeted with a second 22 gauge needle allowing advancement of a Nitrex wire through the cannulated peripheral bile duct to the level of the CBD. The track was dilated with the ADeLandset. Next, with the use of a 4 French angled glide catheter, a regular glidewire was advanced through the CBD past the distally obstructing lesion to the level of the duodenum. Contrast injection confirmed appropriate positioning. Over an Amplatz wire, the track was dilated ultimately allowing placement of an 8 French percutaneous biliary drainage catheter. Note, neither a 10 or 12 French percutaneous biliary drainage catheter was available at the time of this initial placement. Contrast was injected as several spot radiographic images were obtained in various obliquities. The biliary drainage catheter was connected to a gravity bag and secured in place with a interrupted suture and a Stat Lock device. The patient tolerated the procedure well without immediate postprocedural complication. FINDINGS: Sonographic evaluation was  negative for any significant intrahepatic biliary ductal dilatation, as was demonstrated recent cross-sectional imaging. A central aspect of the right biliary tree was ultimately opacified and cannulated allowing access to a nondilated peripheral duct within posteroinferior segment of  the right lobe of the liver utilizing a 2 stick technique, ultimately allowing placement of an 8 [place] biliary drainage catheter with end coiled locked within the duodenum. Limited percutaneous cholangiogram demonstrates long segment narrowing/occlusion involving the mid and peripheral aspect of the CBD. IMPRESSION: Successful placement of an 8 French percutaneous biliary drainage catheter with end coiled and locked within the duodenum. PLAN: - Recommend obtaining daily CMP levels until a nadir of the patient's bilirubin level is obtained. - Note, the standard sized 10 French percutaneous biliary drainage catheter was not available at the of this placement (neither was a 7 [place]) and as such, an 8 [place] drainage catheter was placed. As such, this drainage catheter may be upsized at a later date as clinically indicated. - Consideration for definitive internal biliary stent placement may be considered in 4-6 weeks pending the patient's ultimate plan of care. Electronically Signed   By: Sandi Mariscal M.D.   On: 07/25/2019 15:42    Labs:  CBC: Recent Labs    07/24/19 0836 07/24/19 0836 07/25/19 0731 07/26/19 0549 07/26/19 1535 07/28/19 0459  WBC 16.1*  --  18.2* 14.6*  --  14.5*  HGB 8.1*   < > 9.4* 7.6* 9.6* 9.3*  HCT 24.6*   < > 28.9* 23.4* 29.4* 28.7*  PLT 205  --  224 185  --  161   < > = values in this interval not displayed.    COAGS: Recent Labs    07/07/19 0050 07/14/19 0621 07/25/19 0731  INR 1.2 1.3* 2.2*    BMP: Recent Labs    07/24/19 0836 07/25/19 0731 07/26/19 0549 07/28/19 0459  NA 137 137 136 138  K 3.0* 3.1* 3.3* 3.3*  CL 102 103 103 102  CO2 25 22 25 25   GLUCOSE 142* 181* 191* 148*  BUN 6* 8 9 11   CALCIUM 9.2 9.5 9.1 9.5  CREATININE <0.30* <0.30* <0.30* 0.39*  GFRNONAA NOT CALCULATED NOT CALCULATED NOT CALCULATED >60  GFRAA NOT CALCULATED NOT CALCULATED NOT CALCULATED >60    LIVER FUNCTION TESTS: Recent Labs    07/24/19 0836 07/25/19 0731  07/26/19 0549 07/28/19 0459  BILITOT 28.2* 25.9* 17.2* 13.4*  AST 137* 145* 88* 51*  ALT 183* 200* 167* 111*  ALKPHOS 564* 641* 504* 380*  PROT 6.2* 6.8 5.8* 6.0*  ALBUMIN 2.0* 2.1* 1.9* 2.0*    Assessment and Plan: Pt with hx met panc ca with biliary obstruction; s/p I/E biliary drain placement 1/22; afebrile; K 3.3, creat 0.39, t bili 13.4(17.2), other LFT's down as well; now with leaking of bile at drain insertion site(74f; will plan for drain exchange today; difficulty noted with asp blood from port per nurse ( port tip has migrated up into apex likely secondary to migration of port reservoir down in breast tissue); will plan to discuss possible port revision with Dr. SBenay Spice pt aware of plans   Electronically Signed: D. KRowe Robert PA-C 07/28/2019, 1:15 PM   I spent a total of 20 minutes at the the patient's bedside AND on the patient's hospital floor or unit, greater than 50% of which was counseling/coordinating care for biliary drain exchange    Patient ID: Rachel Vasquez female   DOB: 5Jan 15, 1957 64y.o.   MRN: [id number]

## 2019-07-28 NOTE — Procedures (Signed)
Interventional Radiology Procedure Note  Procedure: Exchange of Int/Ext biliary drain, with new 79F drain placed.  To gravity bag .  Complications: None  Recommendations:  - Routine drain care - will need capping trial. If in-patient, would attempt 1-2 days before DC home.  If capped on same day as DC home, would send home with bag,  and bring to [facility] for eval with CMP and CBC in ~1 week after DC home.   Signed,  Dulcy Fanny. Earleen Newport, DO

## 2019-07-28 NOTE — Sedation Documentation (Signed)
End port

## 2019-07-28 NOTE — Progress Notes (Addendum)
Physical Therapy Treatment Patient Details Name: Rachel Vasquez MRN: [id number] DOB: 04/13/1956 Today's Date: 07/28/2019    History of Present Illness Pt admitted with Pancreatic CA with liver mets and found to have hyperbilirubinemia -?bilary obstruction; Pt s/p PTC with INT/EXT Drain 07/25/19    PT Comments    Pt agreeable to working with PT. Bed sheets stained-unsure if biliary drain or purewick is leaking. Pt appears weak (pt reports this as well- "I don't know if I can do this."). She was able to stand and get to the recliner using a RW. She was fatigued after activity. She c/o some pain during session. Discussed d/c plan-pt stated she plans to return home. She stated she will have assistance at home. Pt is agreeable to HHPT f/u and she is requesting a wheelchair. Will continue to follow and progress activity as tolerated.     Follow Up Recommendations  Home health PT;Supervision/Assistance - 24 hour     Equipment Recommendations  Wheelchair (measurements PT)    Recommendations for Other Services       Precautions / Restrictions Precautions Precautions: Fall Precaution Comments: biliary drain (attached to R LE) Restrictions Weight Bearing Restrictions: No    Mobility  Bed Mobility Overal bed mobility: Needs Assistance Bed Mobility: Supine to Sit     Supine to sit: Min guard     General bed mobility comments: Increased time. No physical assistance given.  Transfers Overall transfer level: Needs assistance Equipment used: Rolling walker (2 wheeled) Transfers: Sit to/from Stand;Stand Pivot Transfers Sit to Stand: Min guard Stand pivot transfers: Min guard       General transfer comment: Increased time. Pt used UEs to pull up on walker. Cues for safety. Took a few side steps before actually pivoting due to pt reporting "legs shaking". Stand pivot, bed to recliner, with RW  Ambulation/Gait   Gait Distance (Feet): 3 Feet Assistive device: Rolling walker (2  wheeled)       General Gait Details: Side steps along side of bed with RW for safety. Pt was then able to take a few more steps to get to recliner.   Stairs             Wheelchair Mobility    Modified Rankin (Stroke Patients Only)       Balance Overall balance assessment: Needs assistance         Standing balance support: Bilateral upper extremity supported Standing balance-Leahy Scale: Poor                              Cognition Arousal/Alertness: Awake/alert Behavior During Therapy: Flat affect Overall Cognitive Status: Within Functional Limits for tasks assessed                                        Exercises      General Comments        Pertinent Vitals/Pain Pain Assessment: Faces Faces Pain Scale: Hurts little more Pain Location: back, abd Pain Descriptors / Indicators: Discomfort;Sore Pain Intervention(s): Limited activity within patient's tolerance;Repositioned    Home Living                      Prior Function            PT Goals (current goals can now be found in the care plan section) Progress towards   PT goals: Progressing toward goals    Frequency    Min 3X/week      PT Plan Discharge plan needs to be updated    Co-evaluation              AM-PAC PT "6 Clicks" Mobility   Outcome Measure  Help needed turning from your back to your side while in a flat bed without using bedrails?: A Little Help needed moving from lying on your back to sitting on the side of a flat bed without using bedrails?: A Little Help needed moving to and from a bed to a chair (including a wheelchair)?: A Little Help needed standing up from a chair using your arms (e.g., wheelchair or bedside chair)?: A Little Help needed to walk in hospital room?: A Little Help needed climbing 3-5 steps with a railing? : A Lot 6 Click Score: 17    End of Session Equipment Utilized During Treatment: Gait belt Activity  Tolerance: Patient limited by fatigue Patient left: in chair;with call bell/phone within reach;with chair alarm set   PT Visit Diagnosis: Muscle weakness (generalized) (M62.81);Difficulty in walking, not elsewhere classified (R26.2)     Time: 0958-1013 PT Time Calculation (min) (ACUTE ONLY): 15 min  Charges:  $Gait training: 8-22 mins                         Jannie P, PT Acute Rehabilitation        

## 2019-07-28 NOTE — Progress Notes (Signed)
      Patient suffers from general weakness, decreased activity tolerance, impaired gait/balance, and fall risk which impairs their ability to perform daily activities like standing, ambulation in the home.  A walker alone will not resolve the issues with performing activities of daily living. A wheelchair will allow patient to safely perform daily activities.  The patient can self propel in the home or has a caregiver who can provide assistance.      Doreatha Massed, PT Acute Rehabilitation

## 2019-07-28 NOTE — Progress Notes (Signed)
Interventional Radiology Progress Note   Ms Rachel Vasquez is 64 yo female with pancreatic cancer, SP Int/Ext Drain for biliary obstruction, and SP port catheter.   We are planning to proceed with biliary drain exchange, for suspected obstruction.   Discussed the patient's non-functional port with Dr. Benay Spice, who agrees that it will need revision. I have also discussed this with the patient, explaining that I think the best way to proceed is to remove the former port, and place a new port.  She understands and would like to proceed at this time.   Risks and benefits of image guided port-a-catheter revision/placement was discussed with the patient including, but not limited to bleeding, infection, pneumothorax, or fibrin sheath development and need for additional procedures.  All of the patient's questions were answered, patient is agreeable to proceed. Consent signed and in chart.  Signed,  Dulcy Fanny. Earleen Newport, DO

## 2019-07-29 ENCOUNTER — Other Ambulatory Visit: Payer: Self-pay | Admitting: Oncology

## 2019-07-29 ENCOUNTER — Telehealth: Payer: Self-pay | Admitting: Hematology

## 2019-07-29 DIAGNOSIS — D696 Thrombocytopenia, unspecified: Secondary | ICD-10-CM

## 2019-07-29 DIAGNOSIS — C259 Malignant neoplasm of pancreas, unspecified: Secondary | ICD-10-CM

## 2019-07-29 DIAGNOSIS — D72829 Elevated white blood cell count, unspecified: Secondary | ICD-10-CM

## 2019-07-29 DIAGNOSIS — C787 Secondary malignant neoplasm of liver and intrahepatic bile duct: Secondary | ICD-10-CM

## 2019-07-29 LAB — CBC WITH DIFFERENTIAL/PLATELET
Abs Immature Granulocytes: 0.26 10*3/uL — ABNORMAL HIGH (ref 0.00–0.07)
Basophils Absolute: 0 10*3/uL (ref 0.0–0.1)
Basophils Relative: 0 %
Eosinophils Absolute: 0.1 10*3/uL (ref 0.0–0.5)
Eosinophils Relative: 0 %
HCT: 28.9 % — ABNORMAL LOW (ref 36.0–46.0)
Hemoglobin: 9 g/dL — ABNORMAL LOW (ref 12.0–15.0)
Immature Granulocytes: 2 %
Lymphocytes Relative: 5 %
Lymphs Abs: 0.7 10*3/uL (ref 0.7–4.0)
MCH: 24.7 pg — ABNORMAL LOW (ref 26.0–34.0)
MCHC: 31.1 g/dL (ref 30.0–36.0)
MCV: 79.4 fL — ABNORMAL LOW (ref 80.0–100.0)
Monocytes Absolute: 0.5 10*3/uL (ref 0.1–1.0)
Monocytes Relative: 4 %
Neutro Abs: 12.9 10*3/uL — ABNORMAL HIGH (ref 1.7–7.7)
Neutrophils Relative %: 89 %
Platelets: 139 10*3/uL — ABNORMAL LOW (ref 150–400)
RBC: 3.64 MIL/uL — ABNORMAL LOW (ref 3.87–5.11)
RDW: 26.5 % — ABNORMAL HIGH (ref 11.5–15.5)
WBC: 14.4 10*3/uL — ABNORMAL HIGH (ref 4.0–10.5)
nRBC: 1 % — ABNORMAL HIGH (ref 0.0–0.2)

## 2019-07-29 LAB — COMPREHENSIVE METABOLIC PANEL
ALT: 94 U/L — ABNORMAL HIGH (ref 0–44)
AST: 52 U/L — ABNORMAL HIGH (ref 15–41)
Albumin: 1.9 g/dL — ABNORMAL LOW (ref 3.5–5.0)
Alkaline Phosphatase: 356 U/L — ABNORMAL HIGH (ref 38–126)
Anion gap: 9 (ref 5–15)
BUN: 15 mg/dL (ref 8–23)
CO2: 28 mmol/L (ref 22–32)
Calcium: 10 mg/dL (ref 8.9–10.3)
Chloride: 101 mmol/L (ref 98–111)
Creatinine, Ser: 0.37 mg/dL — ABNORMAL LOW (ref 0.44–1.00)
GFR calc Af Amer: >60 mL/min (ref >60)
GFR calc non Af Amer: >60 mL/min (ref >60)
Glucose, Bld: 227 mg/dL — ABNORMAL HIGH (ref 70–99)
Potassium: 3.5 mmol/L (ref 3.5–5.1)
Sodium: 138 mmol/L (ref 135–145)
Total Bilirubin: 14.4 mg/dL — ABNORMAL HIGH (ref 0.3–1.2)
Total Protein: 6.4 g/dL — ABNORMAL LOW (ref 6.5–8.1)

## 2019-07-29 MED ORDER — OXYCODONE HCL ER 20 MG PO T12A: 20.0000 mg | EXTENDED_RELEASE_TABLET | Freq: Every morning | ORAL | 0 refills | Status: DC

## 2019-07-29 MED ORDER — LOPERAMIDE HCL 2 MG PO CAPS: 2.0000 mg | ORAL_CAPSULE | ORAL | 0 refills | Status: DC | PRN

## 2019-07-29 MED ORDER — OXYCODONE HCL ER 10 MG PO T12A: 10.0000 mg | EXTENDED_RELEASE_TABLET | Freq: Every day | ORAL | 0 refills | Status: DC

## 2019-07-29 NOTE — Plan of Care (Signed)
Patient discharged home in stable condition. Teaching given to sister [name]

## 2019-07-29 NOTE — Progress Notes (Signed)
Did drain teaching in the patient's room with the sister [name]. I taught her how to change the dressing at the biliary tube entry site, how to empty the drainage bag, and how to flush it. I provided her with all needed supplies. She verbalized understanding and demonstrated proper teach back method, all questions answered.

## 2019-07-29 NOTE — Progress Notes (Signed)
Referring Physician(s): Sherrill,B/Gessner,C  Supervising Physician: Arne Cleveland  Patient Status:  [facility] - In-pt  Chief Complaint:   abdominal pain, pancreatic cancer  Subjective: Pt doing ok this am; has some soreness at both rt chest port and biliary drain sites as expected; no leaking noted at biliary drain site; denies N/V      Allergies: Patient has no known allergies.  Medications: Prior to Admission medications   Medication Sig Start Date End Date Taking? Authorizing Provider  amLODipine (NORVASC) 10 MG tablet Take 1 tablet (10 mg total) by mouth daily. 07/18/19 09/16/19 Yes Antonieta Pert, MD  lidocaine-prilocaine (EMLA) cream Apply 1 application topically as needed. Apply 1 hour prior to stick and cover with plastic wrap 07/16/19  Yes Ladell Pier, MD  LORazepam (ATIVAN) 0.5 MG tablet Take 1 tablet (0.5 mg total) by mouth every 6 (six) hours as needed for up to 15 doses (nausea/vomiting. May give PO or SL.). 07/17/19  Yes Kc, Maren Beach, MD  metoCLOPramide (REGLAN) 5 MG tablet Take 1 tablet (5 mg total) by mouth 3 (three) times daily before meals. 07/17/19 08/16/19 Yes Antonieta Pert, MD  Multiple Vitamins-Minerals (MULTIVITAMIN WITH MINERALS) tablet Take 1 tablet by mouth daily.   Yes [provider]  ondansetron (ZOFRAN-ODT) 4 MG disintegrating tablet Take 1 tablet (4 mg total) by mouth every 8 (eight) hours as needed for up to 30 doses for nausea or vomiting. 07/17/19  Yes Antonieta Pert, MD  oxyCODONE (OXY IR/ROXICODONE) 5 MG immediate release tablet Take 1 tablet (5 mg total) by mouth every 4 (four) hours as needed for up to 15 doses for moderate pain. 07/17/19  Yes Antonieta Pert, MD  pantoprazole (PROTONIX) 40 MG tablet Take 1 tablet (40 mg total) by mouth daily. 07/17/19 08/16/19 Yes Antonieta Pert, MD  polyethylene glycol (MIRALAX / GLYCOLAX) 17 g packet Take 17 g by mouth daily as needed for mild constipation. 07/17/19  Yes Antonieta Pert, MD  potassium chloride SA (KLOR-CON) 20 MEQ  tablet Take 1 tablet (20 mEq total) by mouth daily for 15 days. 07/17/19 08/01/19 Yes Antonieta Pert, MD  predniSONE (DELTASONE) 10 MG tablet Take 1 tablet (10 mg total) by mouth daily with breakfast. 07/18/19 08/17/19 Yes Antonieta Pert, MD  prochlorperazine (COMPAZINE) 10 MG tablet Take 1 tablet (10 mg total) by mouth every 6 (six) hours as needed for nausea. 07/16/19  Yes Ladell Pier, MD     Vital Signs: BP (!) 142/81 (BP Location: Left Arm)   Pulse 91   Temp 98.4 F (36.9 C) (Oral)   Resp 18   Ht _0  (1.702 m)   Wt 238 lb 1.6 oz (108 kg)   SpO2 95%   BMI 37.29 kg/m   Physical Exam awake/alert; rt chest port site clean and dry, mildly tender; biliary drain intact, OP 250 cc green bile; drain flushed with sterile NS without difficulty, site mildly tender  Imaging: IR REMOVAL TUN ACCESS W/ PORT W/O FL MOD SED  Result Date: 07/28/2019 INDICATION: 64 year old female with nonfunctional port catheter EXAM: REMOVAL OF NONFUNCTIONAL PORT CATHETER. IMPLANTED PORT A CATH PLACEMENT WITH ULTRASOUND AND FLUOROSCOPIC GUIDANCE MEDICATIONS: 2 G ANCEF; The antibiotic was administered within an appropriate time interval prior to skin puncture. ANESTHESIA/SEDATION: Moderate (conscious) sedation was employed during this procedure. A total of Versed 4.0 mg and Fentanyl 150 mcg was administered intravenously. Moderate Sedation Time: 45 minutes. The patient's level of consciousness and vital signs were monitored continuously by radiology nursing throughout the procedure  under my direct supervision. FLUOROSCOPY TIME:  One minutes, 24 seconds (73 mGy) COMPLICATIONS: None PROCEDURE: Informed consent was obtained from the patient following an explanation of the procedure, risks, benefits and alternatives. The patient understands, agrees and consents for the procedure. All questions were addressed. A time out was performed prior to the initiation of the procedure. The right neck and chest was prepped with chlorhexidine, and  draped in the usual sterile fashion using maximum barrier technique (cap and mask, sterile gown, sterile gloves, large sterile sheet, hand hygiene and cutaneous antiseptic). Antibiotic prophylaxis was provided with 2.0g Ancef administered IV one hour prior to skin incision. Local anesthesia was attained by infiltration with 1% lidocaine without epinephrine. The patient was positioned in the operation suite on the image intensifier table in the supine position. The right breast was taped to remove redundant tissue and attempt to recreate the position of the breast in an upright position to decrease any redundancy in the port catheter measurement. The previous scar on the right chest was generously infiltrated with 1% lidocaine for local anesthesia. Infiltration of the skin and subcutaneous tissues surrounding the port was performed. Using sharp and blunt dissection, the port apparatus and subcutaneous catheter were removed in their entirety. The port pocket was then closed with interrupted Vicryl layer within the deep tissues, an additional intermediate layer with 2 horizontal mattress sutures using Vicryl, and then a running subcuticular with 4-0 Monocryl. The skin was sealed with Derma bond. A sterile dressing was placed, including Steri-Strips. We then proceeded with placement of new port catheter. Ultrasound survey was performed with images stored and sent to PACs. Ultrasound demonstrated patency of the right internal jugular vein, and this was documented with an image. Under real-time ultrasound guidance, this vein was accessed with a 21 gauge micropuncture needle and image documentation was performed. A small dermatotomy was made at the access site with an 11 scalpel. A 0.018" wire was advanced into the SVC and used to estimate the length of the internal catheter. The access needle exchanged for a 76F micropuncture vascular sheath. The 0.018" wire was then removed and a 0.035" wire advanced into the IVC. An  appropriate location for the subcutaneous reservoir was selected below the clavicle and an incision was made through the skin and underlying soft tissues. The subcutaneous tissues were then dissected using a combination of blunt and sharp surgical technique and a pocket was formed. A single lumen power injectable portacatheter was then tunneled through the subcutaneous tissues from the pocket to the dermatotomy and the port reservoir placed within the subcutaneous pocket. Prolene suture was then used to anchor the port within the port pocket. The venous access site was then serially dilated and a peel away vascular sheath placed over the wire. The wire was removed and the port catheter advanced into position under fluoroscopic guidance. The catheter tip is positioned at the superior cavoatrial junction. This was documented with a spot image. The portacatheter was then tested and found to flush and aspirate well. The port was flushed with saline followed by 100 units/mL heparinized saline. The pocket was then closed in two layers using first subdermal inverted interrupted absorbable sutures followed by a running subcuticular suture. The epidermis was then sealed with Dermabond. The dermatotomy at the venous access site was also seal with Dermabond. Patient tolerated the procedure well and remained hemodynamically stable throughout. No complications encountered and no significant blood loss encountered IMPRESSION: Status post removal of nonfunctional right IJ port catheter with placement of  a new right IJ port catheter. Catheter ready for use. Signed, Dulcy Fanny. Dellia Nims, RPVI [facility] Radiology Electronically Signed   By: Corrie Mckusick D.O.   On: 07/28/2019 17:21   DG CHEST PORT 1 VIEW  Result Date: 07/27/2019 CLINICAL DATA:  Port-A-Cath placement. EXAM: PORTABLE CHEST 1 VIEW COMPARISON:  None. FINDINGS: A right-sided venous Port-A-Cath is seen with its distal  tip overlying the right apex. This is within the region overlying the proximal portion of the right clavicle. Mild linear atelectasis is seen within the right lung base. There is a small left pleural effusion. No pneumothorax is identified. The cardiac silhouette is moderately enlarged. The visualized skeletal structures are unremarkable. IMPRESSION: 1. Right-sided venous Port-A-Cath positioning, as described above. 2. Mild right basilar atelectasis. 3. Small left pleural effusion. Electronically Signed   By: Virgina Norfolk M.D.   On: 07/27/2019 22:43   IR INT EXT BILIARY DRAIN WITH CHOLANGIOGRAM  Result Date: 07/25/2019 INDICATION: Concern for metastatic pancreatic cancer, now with elevated LFTs and failed attempted endoscopic biliary stent placement. As such, request made for attempted placement of an internal/external biliary drainage catheter in hopes of improving the patient's LFTs so she may be a candidate for chemotherapy. Note, preceding cross-sectional imaging is negative for significant intrahepatic biliary ductal dilatation. EXAM: ULTRASOUND AND FLUOROSCOPIC GUIDED PERCUTANEOUS TRANSHEPATIC CHOLANGIOGRAM AND BILIARY TUBE PLACEMENT COMPARISON:  CT abdomen pelvis-07/21/2019; MRCP-07/23/2019 MEDICATIONS: Zosyn 3.75 g IV; the antibiotic was administered with an appropriate frame prior to initiation of the procedure. CONTRAST:  66m OMNIPAQUE IOHEXOL 300 MG/ML SOLN - administered into the biliary tree ANESTHESIA/SEDATION: Moderate (conscious) sedation was employed during this procedure. A total of Versed 8 mg and Fentanyl 300 mcg was administered intravenously. Moderate Sedation Time: 68 minutes. The patient's level of consciousness and vital signs were monitored continuously by radiology nursing throughout the procedure under my direct supervision. FLUOROSCOPY TIME:  22 minutes (12,248mGy) COMPLICATIONS: None immediate. TECHNIQUE: Informed written consent was obtained from the patient after a  discussion of the risks, benefits and alternatives to treatment. Questions regarding the procedure were encouraged and answered. A timeout was performed prior to the initiation of the procedure. The right upper abdominal quadrant was prepped and draped in the usual sterile fashion, and a sterile drape was applied covering the operative field. Maximum barrier sterile technique with sterile gowns and gloves were used for the procedure. A timeout was performed prior to the initiation of the procedure. Ultrasound scanning of the right upper abdominal quadrant was performed to delineate the anatomy and avoid transgression of the gallbladder or the pleural. A spot along the right mid axillary line was marked fluoroscopically inferior to the right costophrenic angle. Sonographic evaluation was negative for significant dilatation of the intrahepatic biliary tree. Initial attempts were made to opacify a nondilated peripheral duct within the right lobe of the liver however despite transient opacification, the duct could not be cannulated. As such, under ultrasound guidance, a 22 gauge needle was advanced towards the central aspect of the biliary tree. Contrast was injected as the needle was slowly retracted ultimately opacifying a central intrahepatic biliary duct. The duct was cannulated with a Nitrex wire which was advanced to the level of the CBD. Under fluoroscopic guidance, the access needle was exchanged for the inner 3 French catheter of the [place] set. A limited percutaneous cholangiogram was then performed. Next, a now opacified nondilated duct within the posterior aspect the right lobe of the liver was targeted with a  second 22 gauge needle allowing advancement of a Nitrex wire through the cannulated peripheral bile duct to the level of the CBD. The track was dilated with the [place] set. Next, with the use of a 4 French angled glide catheter, a regular glidewire was advanced through the CBD past the distally  obstructing lesion to the level of the duodenum. Contrast injection confirmed appropriate positioning. Over an Amplatz wire, the track was dilated ultimately allowing placement of an 8 French percutaneous biliary drainage catheter. Note, neither a 10 or 12 French percutaneous biliary drainage catheter was available at the time of this initial placement. Contrast was injected as several spot radiographic images were obtained in various obliquities. The biliary drainage catheter was connected to a gravity bag and secured in place with a interrupted suture and a Stat Lock device. The patient tolerated the procedure well without immediate postprocedural complication. FINDINGS: Sonographic evaluation was negative for any significant intrahepatic biliary ductal dilatation, as was demonstrated recent cross-sectional imaging. A central aspect of the right biliary tree was ultimately opacified and cannulated allowing access to a nondilated peripheral duct within posteroinferior segment of the right lobe of the liver utilizing a 2 stick technique, ultimately allowing placement of an 8 [place] biliary drainage catheter with end coiled locked within the duodenum. Limited percutaneous cholangiogram demonstrates long segment narrowing/occlusion involving the mid and peripheral aspect of the CBD. IMPRESSION: Successful placement of an 8 French percutaneous biliary drainage catheter with end coiled and locked within the duodenum. PLAN: - Recommend obtaining daily CMP levels until a nadir of the patient's bilirubin level is obtained. - Note, the standard sized 10 French percutaneous biliary drainage catheter was not available at the of this placement (neither was a 67 [place]) and as such, an 8 [place] drainage catheter was placed. As such, this drainage catheter may be upsized at a later date as clinically indicated. - Consideration for definitive internal biliary stent placement may be considered in 4-6 weeks pending the patient's  ultimate plan of care. Electronically Signed   By: Sandi Mariscal M.D.   On: 07/25/2019 15:42   IR EXCHANGE BILIARY DRAIN  Result Date: 07/28/2019 INDICATION: 64 year old female with a history of pancreatic carcinoma, biliary obstruction, hyperbilirubinemia. Concern that the prior drain has become occluded. EXAM: EXCHANGE OF EXISTING INTERNAL/EXTERNAL BILIARY DRAIN MEDICATIONS: 2 g Ancef; The antibiotic was administered within an appropriate time frame prior to the initiation of the procedure. ANESTHESIA/SEDATION: Moderate (conscious) sedation was employed during this procedure. A total of Versed 2.0 mg and Fentanyl 50 mcg was administered intravenously. Moderate Sedation Time: 28 minutes. The patient's level of consciousness and vital signs were monitored continuously by radiology nursing throughout the procedure under my direct supervision. FLUOROSCOPY TIME:  Fluoroscopy Time: 0 minutes 36 seconds (29 mGy). COMPLICATIONS: None PROCEDURE: Informed written consent was obtained from the patient after a thorough discussion of the procedural risks, benefits and alternatives. All questions were addressed. Maximal Sterile Barrier Technique was utilized including caps, mask, sterile gowns, sterile gloves, sterile drape, hand hygiene and skin antiseptic. A timeout was performed prior to the initiation of the procedure. Patient was positioned supine position on the fluoroscopy table. The right upper quadrant was prepped and draped in the usual sterile fashion including the indwelling drain. 1% lidocaine was used for local anesthesia at the skin entry site. Contrast was injected through the indwelling drain, with patent drain into the entry site of the right biliary system. The limb of the drain across the common bile duct appears occluded.  Drain was ligated and the suture was ligated. Bentson wire was advanced through the drain into the duodenum and the drain was removed. 65 French straight sheath was advanced into the  biliary system with the introducer removed. Images were acquired after gentle contrast injection. This confirmed obstruction remains at the common bile duct. We then used modified Seldinger technique to place a new 12 [place] biliary drain across the obstruction. Loop was formed in the duodenum and final contrast injection confirmed antegrade contrast flow into the duodenum. Drain was sutured in position attached to gravity drainage. Patient tolerated the procedure well and remained hemodynamically stable throughout. No complications were encountered and no significant blood loss IMPRESSION: Status post exchange of internal/external biliary drain with placement of a new 12 French drain. Signed, Dulcy Fanny. Dellia Nims, RPVI [facility] Radiology Electronically Signed   By: Corrie Mckusick D.O.   On: 07/28/2019 17:26   IR IMAGING GUIDED PORT INSERTION  Result Date: 07/28/2019 INDICATION: 64 year old female with nonfunctional port catheter EXAM: REMOVAL OF NONFUNCTIONAL PORT CATHETER. IMPLANTED PORT A CATH PLACEMENT WITH ULTRASOUND AND FLUOROSCOPIC GUIDANCE MEDICATIONS: 2 G ANCEF; The antibiotic was administered within an appropriate time interval prior to skin puncture. ANESTHESIA/SEDATION: Moderate (conscious) sedation was employed during this procedure. A total of Versed 4.0 mg and Fentanyl 150 mcg was administered intravenously. Moderate Sedation Time: 45 minutes. The patient's level of consciousness and vital signs were monitored continuously by radiology nursing throughout the procedure under my direct supervision. FLUOROSCOPY TIME:  One minutes, 24 seconds (73 mGy) COMPLICATIONS: None PROCEDURE: Informed consent was obtained from the patient following an explanation of the procedure, risks, benefits and alternatives. The patient understands, agrees and consents for the procedure. All questions were addressed. A time out was performed prior to the initiation of the  procedure. The right neck and chest was prepped with chlorhexidine, and draped in the usual sterile fashion using maximum barrier technique (cap and mask, sterile gown, sterile gloves, large sterile sheet, hand hygiene and cutaneous antiseptic). Antibiotic prophylaxis was provided with 2.0g Ancef administered IV one hour prior to skin incision. Local anesthesia was attained by infiltration with 1% lidocaine without epinephrine. The patient was positioned in the operation suite on the image intensifier table in the supine position. The right breast was taped to remove redundant tissue and attempt to recreate the position of the breast in an upright position to decrease any redundancy in the port catheter measurement. The previous scar on the right chest was generously infiltrated with 1% lidocaine for local anesthesia. Infiltration of the skin and subcutaneous tissues surrounding the port was performed. Using sharp and blunt dissection, the port apparatus and subcutaneous catheter were removed in their entirety. The port pocket was then closed with interrupted Vicryl layer within the deep tissues, an additional intermediate layer with 2 horizontal mattress sutures using Vicryl, and then a running subcuticular with 4-0 Monocryl. The skin was sealed with Derma bond. A sterile dressing was placed, including Steri-Strips. We then proceeded with placement of new port catheter. Ultrasound survey was performed with images stored and sent to PACs. Ultrasound demonstrated patency of the right internal jugular vein, and this was documented with an image. Under real-time ultrasound guidance, this vein was accessed with a 21 gauge micropuncture needle and image documentation was performed. A small dermatotomy was made at the access site with an 11 scalpel. A 0.018" wire was advanced into the SVC and used to estimate the length of the internal catheter. The  access needle exchanged for a 107F micropuncture vascular sheath. The  0.018" wire was then removed and a 0.035" wire advanced into the IVC. An appropriate location for the subcutaneous reservoir was selected below the clavicle and an incision was made through the skin and underlying soft tissues. The subcutaneous tissues were then dissected using a combination of blunt and sharp surgical technique and a pocket was formed. A single lumen power injectable portacatheter was then tunneled through the subcutaneous tissues from the pocket to the dermatotomy and the port reservoir placed within the subcutaneous pocket. Prolene suture was then used to anchor the port within the port pocket. The venous access site was then serially dilated and a peel away vascular sheath placed over the wire. The wire was removed and the port catheter advanced into position under fluoroscopic guidance. The catheter tip is positioned at the superior cavoatrial junction. This was documented with a spot image. The portacatheter was then tested and found to flush and aspirate well. The port was flushed with saline followed by 100 units/mL heparinized saline. The pocket was then closed in two layers using first subdermal inverted interrupted absorbable sutures followed by a running subcuticular suture. The epidermis was then sealed with Dermabond. The dermatotomy at the venous access site was also seal with Dermabond. Patient tolerated the procedure well and remained hemodynamically stable throughout. No complications encountered and no significant blood loss encountered IMPRESSION: Status post removal of nonfunctional right IJ port catheter with placement of a new right IJ port catheter. Catheter ready for use. Signed, Dulcy Fanny. Dellia Nims, RPVI [facility] Radiology Electronically Signed   By: Corrie Mckusick D.O.   On: 07/28/2019 17:21    Labs:  CBC: Recent Labs    07/25/19 0731 07/25/19 0731 07/26/19 0549 07/26/19 1535 07/28/19 0459 07/29/19 0916  WBC  18.2*  --  14.6*  --  14.5* 14.4*  HGB 9.4*   < > 7.6* 9.6* 9.3* 9.0*  HCT 28.9*   < > 23.4* 29.4* 28.7* 28.9*  PLT 224  --  185  --  161 139*   < > = values in this interval not displayed.    COAGS: Recent Labs    07/07/19 0050 07/14/19 0621 07/25/19 0731  INR 1.2 1.3* 2.2*    BMP: Recent Labs    07/25/19 0731 07/26/19 0549 07/28/19 0459 07/29/19 0916  NA 137 136 138 138  K 3.1* 3.3* 3.3* 3.5  CL 103 103 102 101  CO2 _0 GLUCOSE 181* 191* 148* 227*  BUN _1 CALCIUM 9.5 9.1 9.5 10.0  CREATININE <0.30* <0.30* 0.39* 0.37*  GFRNONAA NOT CALCULATED NOT CALCULATED >60 >60  GFRAA NOT CALCULATED NOT CALCULATED >60 >60    LIVER FUNCTION TESTS: Recent Labs    07/25/19 0731 07/26/19 0549 07/28/19 0459 07/29/19 0916  BILITOT 25.9* 17.2* 13.4* 14.4*  AST 145* 88* 51* 52*  ALT 200* 167* 111* 94*  ALKPHOS 641* 504* 380* 356*  PROT 6.8 5.8* 6.0* 6.4*  ALBUMIN 2.1* 1.9* 2.0* 1.9*    Assessment and Plan: Pt with hx met panc ca with biliary obstruction; s/p I/E biliary drain placement 1/22; s/p biliary drain exchange/upsizing (now 108f and placement of new rt PAC 1/25 ; afebrile; WBC 14.4(14.5), hgb 9(9.3), creat 0.37, t bili 14.4(13.4); send bile for cx/cytology; cont drain irrigation/close lab monitoring for hopeful drop in bilirubin; once bilirubin drops sig then consider capping trial   Electronically Signed: D.  Rowe Robert, PA-C 07/29/2019, 10:39 AM   I spent a total of 15 minutes at the the patient's bedside AND on the patient's hospital floor or unit, greater than 50% of which was counseling/coordinating care for biliary drain    Patient ID: Rachel Vasquez, female   DOB: 1956-01-16, 64 y.o.   MRN: [id number]

## 2019-07-29 NOTE — TOC Initial Note (Addendum)
Transition of Care [facility]) - Initial/Assessment Note    Patient Details  Name: Rachel Vasquez MRN: [id number] Date of Birth: 11/15/1955  Transition of Care [facility]) CM/SW Contact:    Lynnell Catalan, RN Phone Number: 07/29/2019, 1:28 PM  Clinical Narrative:                 Patient from home. Sister is going to be staying with pt at dc. Wheelchair requested at DC. Order received and [place] liaison alerted of need for DME. This CM alerted pt of PT recommendations for HHPT/Supervision. This CM also alerted pt that with her insurance of commercial BCBS pt would likely have a copay for every home health visit. Pt states that she would not like me to set this up for her at this time. She states that she will "talk to the [facility] case manager". This CM also informed RN staff on last week that pt/sister would need to be taught to care for bili drain.  Expected Discharge Plan: Home/Self Care Barriers to Discharge: No Barriers Identified   Patient Goals and CMS Choice        Expected Discharge Plan and Services Expected Discharge Plan: Home/Self Care   Discharge Planning Services: CM Consult Post Acute Care Choice: Durable Medical Equipment Living arrangements for the past 2 months: Apartment Expected Discharge Date: 07/29/19               DME Arranged: Wheelchair manual DME Agency: [employer] Date DME Agency Contacted: 07/29/19 Time DME Agency Contacted: 1200 Representative spoke with at DME Agency: Zack            Prior Living Arrangements/Services Living arrangements for the past 2 months: Apartment Lives with:: Self          Need for Family Participation in Patient Care: Yes (Comment) Care giver support system in place?: Yes (comment)      Activities of Daily Living Home Assistive Devices/Equipment: None ADL Screening (condition at time of admission) Patient's cognitive ability adequate to safely complete daily activities?: Yes Is the patient deaf or have difficulty  hearing?: No Does the patient have difficulty seeing, even when wearing glasses/contacts?: No Does the patient have difficulty concentrating, remembering, or making decisions?: No Patient able to express need for assistance with ADLs?: No Does the patient have difficulty dressing or bathing?: No Independently performs ADLs?: Yes (appropriate for developmental age) Does the patient have difficulty walking or climbing stairs?: No Weakness of Legs: None Weakness of Arms/Hands: None  Permission Sought/Granted                  Emotional Assessment Appearance:: Appears stated age            Admission diagnosis:  Pancreatic cancer metastasized to liver ([place]) [C25.9, C78.7] Patient Active Problem List   Diagnosis Date Noted  . 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 Pancreatic cancer metastasized to liver ([place]) 07/21/2019  . Goals of care, counseling/discussion 07/16/2019  . Pancreatic adenocarcinoma ([place]) 07/10/2019  . Hypokalemia 07/10/2019  . Hypomagnesemia 07/10/2019  . Pancreatitis 07/09/2019  . Microcytic anemia 07/09/2019  . Abdominal pain, epigastric 07/03/2019  . Pancreatic mass 07/03/2019  . GERD (gastroesophageal reflux disease)   . Hiatal hernia   . Liver lesion   . 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 Nausea & vomiting 07/02/2019   PCP:  Patient, [name] Per Pharmacy:   [id number] - [place], [place] - [address] [id number]  [place] [id number] Phone: [phone] Fax: [phone]     Social Determinants of Health (SDOH) Interventions    Readmission Risk Interventions Readmission Risk Prevention Plan 07/29/2019  Transportation Screening Complete  Medication Review Press photographer) Complete  PCP or Specialist appointment within 3-5 days of discharge Complete  HRI or [place] Complete  SW Recovery Care/Counseling Consult Complete  [place] Not Applicable  Some recent data might be hidden

## 2019-07-29 NOTE — Progress Notes (Signed)
PT Cancellation Note  Patient Details Name: Rachel Vasquez MRN: [id number] DOB: [birth year]   Cancelled Treatment:    Reason Eval/Treat Not Completed: Patient declined, no reason specified Pt states she was in the chair already this morning and has ambulated to bathroom, and "that's enough" for today.  Pt declined due to fatigue and not feeling well.     Ivonna Kinnick,KATHrine E 07/29/2019, 11:57 AM Arlyce Dice, DPT Acute Rehabilitation Services Office: [phone]

## 2019-07-29 NOTE — Discharge Summary (Addendum)
Discharge Summary  Patient ID: Rachel Vasquez MRN: [id number] DOB/AGE: June 06, 1956 63 y.o.  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 [facility])   Discharged Condition: stable  Discharge Labs:   CBC    Component Value Date/Time   WBC 14.4 (H) 07/29/2019 0916   RBC 3.64 (L) 07/29/2019 0916   HGB 9.0 (L) 07/29/2019 0916   HGB 10.2 (L) 07/21/2019 0848   HCT 28.9 (L) 07/29/2019 0916   PLT 139 (L) 07/29/2019 0916   PLT 205 07/21/2019 0848   MCV 79.4 (L) 07/29/2019 0916   MCH 24.7 (L) 07/29/2019 0916   MCHC 31.1 07/29/2019 0916   RDW 26.5 (H) 07/29/2019 0916   LYMPHSABS 0.7 07/29/2019 0916   MONOABS 0.5 07/29/2019 0916   EOSABS 0.1 07/29/2019 0916   BASOSABS 0.0 07/29/2019 0916   CMP Latest Ref Rng & Units 07/29/2019 07/28/2019 07/26/2019  Glucose 70 - 99 mg/dL 227(H) 148(H) 191(H)  BUN 8 - 23 mg/dL 15 11 9   Creatinine 0.44 - 1.00 mg/dL 0.37(L) 0.39(L) <0.30(L)  Sodium 135 - 145 mmol/L 138 138 136  Potassium 3.5 - 5.1 mmol/L 3.5 3.3(L) 3.3(L)  Chloride 98 - 111 mmol/L 101 102 103  CO2 22 - 32 mmol/L 28 25 25   Calcium 8.9 - 10.3 mg/dL 10.0 9.5 9.1  Total Protein 6.5 - 8.1 g/dL 6.4(L) 6.0(L) 5.8(L)  Total Bilirubin 0.3 - 1.2 mg/dL 14.4(H) 13.4(H) 17.2(H)  Alkaline Phos 38 - 126 U/L 356(H) 380(H) 504(H)  AST 15 - 41 U/L 52(H) 51(H) 88(H)  ALT 0 - 44 U/L 94(H) 111(H) 167(H)   Consults: GI and Interventional Radiology  Procedures:  1.  07/24/2019-ERCP by Dr. Carlean Purl.  Unable to perform biliary cannulation. 2.  07/25/2019-biliary drain placement by interventional radiology. 3.  07/27/2018-replacement of Port-A-Cath 4.  07/27/2018-exchange of biliary drain due to leaking around the drain site.  Disposition: Discharge disposition: 01-Home or Self Care      Allergies as of 07/29/2019   No Known Allergies     Medication List    STOP taking these  medications   potassium chloride SA 20 MEQ tablet Commonly known as: KLOR-CON     TAKE these medications   amLODipine 10 MG tablet Commonly known as: NORVASC Take 1 tablet (10 mg total) by mouth daily.   lidocaine-prilocaine cream Commonly known as: EMLA Apply 1 application topically as needed. Apply 1 hour prior to stick and cover with plastic wrap   loperamide 2 MG capsule Commonly known as: IMODIUM Take 1 capsule (2 mg total) by mouth as needed for diarrhea or loose stools.   LORazepam 0.5 MG tablet Commonly known as: ATIVAN Take 1 tablet (0.5 mg total) by mouth every 6 (six) hours as needed for up to 15 doses (nausea/vomiting. May give PO or SL.).   metoCLOPramide 5 MG tablet Commonly known as: REGLAN Take 1 tablet (5 mg total) by mouth 3 (three) times daily before meals.   multivitamin with minerals tablet Take 1 tablet by mouth daily.   ondansetron 4 MG disintegrating tablet Commonly known as: ZOFRAN-ODT Take 1 tablet (4 mg total) by mouth every 8 (eight) hours as needed for up to 30 doses for nausea or vomiting.   oxyCODONE 5 MG immediate release tablet Commonly known as: Oxy IR/ROXICODONE Take 1 tablet (5 mg total) by mouth every 4 (four) hours as needed for up to 15 doses for moderate  pain.   oxyCODONE 10 mg 12 hr tablet Commonly known as: OXYCONTIN Take 1 tablet (10 mg total) by mouth at bedtime for 7 doses.   oxyCODONE 20 mg 12 hr tablet Commonly known as: OXYCONTIN Take 1 tablet (20 mg total) by mouth every morning for 7 doses.   pantoprazole 40 MG tablet Commonly known as: PROTONIX Take 1 tablet (40 mg total) by mouth daily.   polyethylene glycol 17 g packet Commonly known as: MIRALAX / GLYCOLAX Take 17 g by mouth daily as needed for mild constipation.   predniSONE 10 MG tablet Commonly known as: DELTASONE Take 1 tablet (10 mg total) by mouth daily with breakfast.   prochlorperazine 10 MG tablet Commonly known as: COMPAZINE Take 1 tablet (10 mg  total) by mouth every 6 (six) hours as needed for nausea.            Durable Medical Equipment  (From admission, onward)         Start     Ordered   07/29/19 1141  DME standard manual wheelchair with seat cushion  Once    Comments: Patient suffers from weakness due to metastatic pancreatic cancer which impairs their ability to perform daily activities like bathing and dressing in the home.  A walker will not resolve issue with performing activities of daily living. A wheelchair will allow patient to safely perform daily activities. Patient can safely propel the wheelchair in the home or has a caregiver who can provide assistance. Length of need Lifetime. Accessories: elevating leg rests (ELRs), wheel locks, extensions and anti-tippers.   07/29/19 1143            HPI:  Rachel Vasquez is a 64 year old woman recently diagnosed with metastatic pancreas cancer involving the liver.  She presents to the office today for the first cycle of gemcitabine/Abraxane.  She reports nausea is better.  She vomited yesterday.  She typically has "dry heaves".  Bowels have moved regularly for the past 2 days.  Abdominal pain is significantly better.  She continues OxyContin with oxycodone as needed.  She denies fever.    Lab work unexpectedly showed significant elevation of the bilirubin.  She is being hospitalized for additional evaluation of probable biliary obstruction.  Hospital Course:  On admission, the patient had a CT of the abdomen pelvis with contrast to evaluate for obstruction.  The CT did not show an obvious obstruction but did show some increase in the size of her pancreatic mass.  GI had initially consider doing an ERCP, but since the CT did not show obvious obstruction this was canceled.  Patient subsequently had an MRI/MRCP which showed CBD stricture.  GI was informed of this finding and recommended ERCP with biliary stent placement.  The patient underwent this procedure on 07/24/2019, but stent  could not be placed.  The patient was then referred to interventional radiology who placed an external biliary drain.  The patient was having a lot of leakage from the biliary drain site and underwent exchange of the biliary drain on 07/28/2019.  Since the exchange, she has not had any additional leakage from the biliary drain site.  Additional issues during her hospitalization included aggression of her Port-A-Cath from the initial site.  She underwent Port-A-Cath revision on 07/28/2019.  The patient also had intermittent hypokalemia and was treated with both IV and oral potassium.  She also developed anemia with a hemoglobin down to 7.6 and received 1 unit PRBCs on 07/25/2018 with improvement of her hemoglobin.  The patient has been having generalized weakness and difficulty transferring.  PT has evaluated the patient and recommended home health PT and a wheelchair.  The patient's pain has been controlled on her current pain medication regimen.  Nausea and vomiting is controlled.  She is eating and drinking adequately.  The patient was seen on 07/29/2019 and indicated that she wanted to go home.  Labs were reviewed from the morning of discharge which showed a stable hemoglobin, mild leukocytosis without fever or other signs of infection, and mild thrombocytopenia.  AST, ALT, and alkaline phosphatase trending downward.  Total bilirubin is down to 14.4.  The patient was deemed stable for discharge.  Orders have been placed for home health PT and a wheelchair for home use.  The patient will have outpatient follow-up at the cancer center on Friday, 08/01/2019 with a repeat CBC in [facility].   Discharge Instructions    Diet - low sodium heart healthy   Complete by: As directed    Diet general   Complete by: As directed    Increase activity slowly   Complete by: As directed    Increase activity slowly   Complete by: As directed      Signed: Mikey Bussing 07/29/2019, 11:45 AM   Rachel Vasquez was evaluated and  examined on the day of discharge.  She remains weak with limited ambulation and limited oral intake.  She insists on going home.  I discussed the situation with her sister.  I recommend home physical therapy.  I am told by the floor RN that the patient refuses the co-pay for home physical therapy.  She understands she will not be able to receive chemotherapy unless she is ambulatory and the bilirubin improves.  She will be scheduled for follow-up visit on 08/01/2019.

## 2019-07-29 NOTE — Telephone Encounter (Signed)
Pt's sister [name] called around 10:45pm and reported issue with the biliary draining tube. She noticed the flush tube is broken and she is not able to flush with saline as she supposed to do. She reports no leakage, and it's draining fine. She requested me to send some one to her home to fix the problem. I suggested her to bring pt in tomorrow morning so IR can fix the issue and she refused, she states pt is in pain and she could not bring her in. I told her it's OK not to flush it tonight, and we will try to get hold of home care service tomorrow. She got very upset and demanded me to resolve the issue tonight.  I called IR on-call physician Dr. Laurence Ferrari and he reassured me OK not to flush it tonight, and if home care nurse can not resolve the issue tomorrow, then pt needs to come in to be seen at IR. I relayed the message to [name], and promised her that we will contact home care service tomorrow morning and call her back. She agreed.   Truitt Merle  07/29/2019

## 2019-07-29 NOTE — Discharge Instructions (Signed)
Call for a fever, inability to eat, confusion, or shortness of breath

## 2019-07-30 ENCOUNTER — Other Ambulatory Visit: Payer: Self-pay

## 2019-07-30 ENCOUNTER — Encounter (HOSPITAL_COMMUNITY): Payer: Self-pay

## 2019-07-30 ENCOUNTER — Telehealth: Payer: Self-pay | Admitting: *Deleted

## 2019-07-30 ENCOUNTER — Other Ambulatory Visit (HOSPITAL_COMMUNITY): Payer: Self-pay | Admitting: Oncology

## 2019-07-30 ENCOUNTER — Ambulatory Visit (HOSPITAL_COMMUNITY)
Admission: RE | Admit: 2019-07-30 | Discharge: 2019-07-30 | Disposition: A | Discharge status: Home / Self Care | Payer: BC Managed Care – PPO | Source: Ambulatory Visit | Attending: Oncology | Admitting: Oncology

## 2019-07-30 DIAGNOSIS — K831 Obstruction of bile duct: Secondary | ICD-10-CM

## 2019-07-30 DIAGNOSIS — C259 Malignant neoplasm of pancreas, unspecified: Secondary | ICD-10-CM

## 2019-07-30 DIAGNOSIS — C787 Secondary malignant neoplasm of liver and intrahepatic bile duct: Secondary | ICD-10-CM

## 2019-07-30 LAB — CYTOLOGY - NON PAP

## 2019-07-30 NOTE — Telephone Encounter (Signed)
@   1245: [place] to re-initiate her Nursing and PT referral after speaking with patient in radiology lobby (she has now agreed to this). Was instructed to send referral order to [phone]. Faxed order w/sister's contact #, insurance card and discharge summary to above fax with request to begin services today if possible.

## 2019-07-30 NOTE — Telephone Encounter (Signed)
Called sister back and made her aware that [facility] received the referral orders and staff member, Janace Hoard will be reaching out to her today or tomorrow morning. Provided the phone # for [facility] for her to call if she does not hear from her by tomorrow at noon. Encouraged her and provided positive thoughts for her willingness to take care of her sister.

## 2019-07-30 NOTE — Telephone Encounter (Addendum)
Left voice mail for patient/sister asking if they were able to resolve the issue with her biliary drain. Per Dr. Benay Spice, she will need to have an appointment with IR to assess and correct the issue. [place] to see if she has been assigned a nurse, but no orders received. Per Dr. Benay Spice, she had declined home care PT/OT due to co-pay required. Sister called back and reports when she unscrewed the cap (there was resistance) and there were thin fibers coming from cap and it would not flush. She could get family there to help get her into car for at 12:00 or 3:30pm today to bring her to IR department. Sister is upset that patient declined PT and nursing help in home and she needs this help to take care of her. Spoke with IR, family member must have broken the cap--takes a lot of force for that to happen. The drain/cap was special ordered for her and they do not have one on the shelf right now. They will f/u and call back. Left VM for sister that IR is trying to reach her to schedule time to bring [facility] in for drain repair. Will try to speak w/patient when she arrives to get permission for PT and [facility]

## 2019-07-30 NOTE — Telephone Encounter (Signed)
Voice mail from [place] with [facility] that they are not able to accept patient due to her [employer] and difficulty with carrier in regards to getting authorizations. This RN called back for more information and was then told she was declined due to "staffing". [place] [phone]) and was told they accept [employer] and referral and chart information was faxed to [phone]

## 2019-07-31 ENCOUNTER — Other Ambulatory Visit: Payer: Self-pay | Admitting: *Deleted

## 2019-07-31 ENCOUNTER — Telehealth: Payer: Self-pay | Admitting: *Deleted

## 2019-07-31 DIAGNOSIS — C259 Malignant neoplasm of pancreas, unspecified: Secondary | ICD-10-CM

## 2019-07-31 LAB — BODY FLUID CULTURE
Gram Stain: NONE SEEN
Special Requests: NORMAL

## 2019-07-31 NOTE — Telephone Encounter (Signed)
Received numerous telephone calls from patient's sister today. Patient had a fall to the floor last night she did not sustain any injuries, it was more of a slide to the floor. It took 3 people to get her up and into bed.   Patient is refusing to assist with ADL's and ambulation. She refused to get out of bed this morning. Patient is not complaining of pain. She states she just can't do it.   Sister states she is not able to care for her sister. The patient is not responsive to her encouragement to push herself and keep trying. Patient just wants to stay in bed and sleep. Sister has decided the patient would be better off in a rehab facility that would be able to force her to work on her weakness.   Referral to social work was sent in and Ander Purpura was notified.    IR was able to solve the problem with her biliary drain. It seems to be working without any issues.

## 2019-08-01 ENCOUNTER — Other Ambulatory Visit: Payer: Self-pay | Admitting: Physician Assistant

## 2019-08-01 ENCOUNTER — Other Ambulatory Visit: Payer: Self-pay

## 2019-08-01 ENCOUNTER — Telehealth: Payer: Self-pay

## 2019-08-01 ENCOUNTER — Inpatient Hospital Stay (HOSPITAL_COMMUNITY)
Admission: AD | Admit: 2019-08-01 | Discharge: 2019-08-09 | Disposition: A | Discharge status: Hospice | Payer: BC Managed Care – PPO | Source: Ambulatory Visit | Attending: Oncology | Admitting: Oncology

## 2019-08-01 ENCOUNTER — Inpatient Hospital Stay: Payer: BC Managed Care – PPO

## 2019-08-01 ENCOUNTER — Encounter: Payer: Self-pay | Admitting: General Practice

## 2019-08-01 ENCOUNTER — Encounter (HOSPITAL_COMMUNITY): Payer: Self-pay | Admitting: Oncology

## 2019-08-01 ENCOUNTER — Encounter: Payer: BC Managed Care – PPO | Admitting: Medical

## 2019-08-01 ENCOUNTER — Inpatient Hospital Stay (HOSPITAL_COMMUNITY): Payer: BC Managed Care – PPO

## 2019-08-01 ENCOUNTER — Other Ambulatory Visit: Payer: Self-pay | Admitting: Emergency Medicine

## 2019-08-01 ENCOUNTER — Encounter: Payer: Self-pay | Admitting: Nurse Practitioner

## 2019-08-01 ENCOUNTER — Inpatient Hospital Stay (HOSPITAL_BASED_OUTPATIENT_CLINIC_OR_DEPARTMENT_OTHER): Payer: BC Managed Care – PPO | Admitting: Nurse Practitioner

## 2019-08-01 VITALS — BP 102/80 | HR 127 | Temp 98.5°F | Resp 18 | Ht 67.0 in

## 2019-08-01 DIAGNOSIS — C250 Malignant neoplasm of head of pancreas: Secondary | ICD-10-CM

## 2019-08-01 DIAGNOSIS — R739 Hyperglycemia, unspecified: Secondary | ICD-10-CM

## 2019-08-01 DIAGNOSIS — C787 Secondary malignant neoplasm of liver and intrahepatic bile duct: Secondary | ICD-10-CM

## 2019-08-01 DIAGNOSIS — G893 Neoplasm related pain (acute) (chronic): Secondary | ICD-10-CM

## 2019-08-01 DIAGNOSIS — C259 Malignant neoplasm of pancreas, unspecified: Secondary | ICD-10-CM

## 2019-08-01 DIAGNOSIS — R0600 Dyspnea, unspecified: Secondary | ICD-10-CM

## 2019-08-01 DIAGNOSIS — C801 Malignant (primary) neoplasm, unspecified: Secondary | ICD-10-CM

## 2019-08-01 DIAGNOSIS — Z1152 Encounter for screening for COVID-19: Secondary | ICD-10-CM | POA: Insufficient documentation

## 2019-08-01 DIAGNOSIS — E860 Dehydration: Secondary | ICD-10-CM

## 2019-08-01 DIAGNOSIS — Z95828 Presence of other vascular implants and grafts: Secondary | ICD-10-CM

## 2019-08-01 DIAGNOSIS — R5383 Other fatigue: Secondary | ICD-10-CM

## 2019-08-01 DIAGNOSIS — R627 Adult failure to thrive: Secondary | ICD-10-CM

## 2019-08-01 DIAGNOSIS — E876 Hypokalemia: Secondary | ICD-10-CM

## 2019-08-01 DIAGNOSIS — K5903 Drug induced constipation: Secondary | ICD-10-CM

## 2019-08-01 DIAGNOSIS — R4182 Altered mental status, unspecified: Secondary | ICD-10-CM

## 2019-08-01 DIAGNOSIS — R17 Unspecified jaundice: Secondary | ICD-10-CM

## 2019-08-01 LAB — CMP (CANCER CENTER ONLY)
ALT: 98 U/L — ABNORMAL HIGH (ref 0–44)
AST: 58 U/L — ABNORMAL HIGH (ref 15–41)
Albumin: 1.8 g/dL — ABNORMAL LOW (ref 3.5–5.0)
Alkaline Phosphatase: 321 U/L — ABNORMAL HIGH (ref 38–126)
Anion gap: 10 (ref 5–15)
BUN: 19 mg/dL (ref 8–23)
CO2: 27 mmol/L (ref 22–32)
Calcium: 10.2 mg/dL (ref 8.9–10.3)
Chloride: 100 mmol/L (ref 98–111)
Creatinine: 0.84 mg/dL (ref 0.44–1.00)
GFR, Est AFR Am: >60 mL/min (ref >60)
GFR, Estimated: >60 mL/min (ref >60)
Glucose, Bld: 251 mg/dL — ABNORMAL HIGH (ref 70–99)
Potassium: 3.5 mmol/L (ref 3.5–5.1)
Sodium: 137 mmol/L (ref 135–145)
Total Bilirubin: 13.3 mg/dL (ref 0.3–1.2)
Total Protein: 6.6 g/dL (ref 6.5–8.1)

## 2019-08-01 LAB — CBC WITH DIFFERENTIAL (CANCER CENTER ONLY)
Abs Immature Granulocytes: 0.23 10*3/uL — ABNORMAL HIGH (ref 0.00–0.07)
Basophils Absolute: 0.1 10*3/uL (ref 0.0–0.1)
Basophils Relative: 0 %
Eosinophils Absolute: 0.1 10*3/uL (ref 0.0–0.5)
Eosinophils Relative: 0 %
HCT: 32.6 % — ABNORMAL LOW (ref 36.0–46.0)
Hemoglobin: 10.5 g/dL — ABNORMAL LOW (ref 12.0–15.0)
Immature Granulocytes: 1 %
Lymphocytes Relative: 5 %
Lymphs Abs: 1 10*3/uL (ref 0.7–4.0)
MCH: 25.2 pg — ABNORMAL LOW (ref 26.0–34.0)
MCHC: 32.2 g/dL (ref 30.0–36.0)
MCV: 78.2 fL — ABNORMAL LOW (ref 80.0–100.0)
Monocytes Absolute: 0.7 10*3/uL (ref 0.1–1.0)
Monocytes Relative: 4 %
Neutro Abs: 17.6 10*3/uL — ABNORMAL HIGH (ref 1.7–7.7)
Neutrophils Relative %: 90 %
Platelet Count: 111 10*3/uL — ABNORMAL LOW (ref 150–400)
RBC: 4.17 MIL/uL (ref 3.87–5.11)
RDW: 26.5 % — ABNORMAL HIGH (ref 11.5–15.5)
WBC Count: 19.7 10*3/uL — ABNORMAL HIGH (ref 4.0–10.5)
nRBC: 0.8 % — ABNORMAL HIGH (ref 0.0–0.2)

## 2019-08-01 LAB — SARS CORONAVIRUS 2 (TAT 6-24 HRS): SARS Coronavirus 2: NEGATIVE

## 2019-08-01 LAB — AMMONIA: Ammonia: 61 umol/L — ABNORMAL HIGH (ref 9–35)

## 2019-08-01 MED ORDER — PREDNISONE 5 MG PO TABS
10.0000 mg | ORAL_TABLET | Freq: Every day | ORAL | Status: DC
  Administered 2019-08-02 – 2019-08-03 (×2): 10 mg via ORAL
  Filled 2019-08-01 (×2): qty 2

## 2019-08-01 MED ORDER — SODIUM CHLORIDE 0.9% FLUSH
10.0000 mL | INTRAVENOUS | Status: DC | PRN
  Administered 2019-08-01: 10 mL via INTRAVENOUS
  Filled 2019-08-01: qty 10

## 2019-08-01 MED ORDER — POLYETHYLENE GLYCOL 3350 17 G PO PACK
17.0000 g | PACK | Freq: Every day | ORAL | Status: DC | PRN
  Administered 2019-08-02: 17 g via ORAL
  Filled 2019-08-01: qty 1

## 2019-08-01 MED ORDER — PANTOPRAZOLE SODIUM 40 MG PO TBEC
40.0000 mg | DELAYED_RELEASE_TABLET | Freq: Every day | ORAL | Status: DC
  Administered 2019-08-02 – 2019-08-06 (×5): 40 mg via ORAL
  Filled 2019-08-01 (×5): qty 1

## 2019-08-01 MED ORDER — HEPARIN SOD (PORK) LOCK FLUSH 100 UNIT/ML IV SOLN
500.0000 [IU] | Freq: Once | INTRAVENOUS | Status: AC
  Administered 2019-08-01: 500 [IU]
  Filled 2019-08-01: qty 5

## 2019-08-01 MED ORDER — OXYCODONE HCL 5 MG PO TABS
5.0000 mg | ORAL_TABLET | ORAL | Status: DC | PRN
  Administered 2019-08-01 – 2019-08-03 (×4): 5 mg via ORAL
  Filled 2019-08-01 (×4): qty 1

## 2019-08-01 MED ORDER — SODIUM CHLORIDE 0.9% FLUSH
10.0000 mL | Freq: Once | INTRAVENOUS | Status: AC
  Administered 2019-08-01: 12:00:00 10 mL
  Filled 2019-08-01: qty 10

## 2019-08-01 MED ORDER — FLUCONAZOLE 50 MG PO TABS
50.0000 mg | ORAL_TABLET | Freq: Every day | ORAL | Status: AC
  Administered 2019-08-01 – 2019-08-05 (×5): 50 mg via ORAL
  Filled 2019-08-01 (×6): qty 1

## 2019-08-01 MED ORDER — SODIUM CHLORIDE 0.9 % IV SOLN: INTRAVENOUS | Status: AC

## 2019-08-01 MED ORDER — ENOXAPARIN SODIUM 40 MG/0.4ML ~~LOC~~ SOLN
40.0000 mg | SUBCUTANEOUS | Status: DC
  Administered 2019-08-01 – 2019-08-06 (×6): 40 mg via SUBCUTANEOUS
  Filled 2019-08-01 (×6): qty 0.4

## 2019-08-01 MED ORDER — ONDANSETRON HCL 8 MG PO TABS
8.0000 mg | ORAL_TABLET | Freq: Three times a day (TID) | ORAL | Status: DC | PRN
  Filled 2019-08-01: qty 1

## 2019-08-01 NOTE — H&P (Addendum)
Admission History and Physical      Chief Complaint: Pancreas cancer  HPI: Ms. Tapani is a 64 year old woman with recently diagnosed metastatic pancreas cancer.  She was scheduled to begin chemotherapy with gemcitabine/Abraxane 07/21/2019.  The bilirubin that day unexpectedly returned markedly elevated.  She was hospitalized with suspected biliary obstruction.  MRI/MRCP showed the pancreatic head mass, a malignant common bile duct stricture at the level of the pancreatic head with prominent dilatation of the proximal common bile duct, mild to moderate diffuse intrahepatic biliary ductal dilatation.  Widespread liver metastases noted.  The porta splenic venous confluence was occluded.  There was bland appearing nonocclusive thrombus of the SMV and of the main portal vein near the bifurcation.  There was near occlusive bland appearing thrombosis of the right portal vein.  A stent could not be placed during ERCP on 07/23/2018.  She underwent percutaneous biliary drain placement in interventional radiology.  She was discharged home on 07/29/2019.  She presents to the office today for follow-up.  She is accompanied by her sister who reports they are having difficulty managing her care at home.  She is very weak and unable to walk.  They recently had to slide her to the floor to avoid a fall.  It required 3 people to get her back to the bed.  Oral intake is extremely limited.  She is using the bedside commode with assistance.  The biliary drain seems to be working.  She has not been confused but sister notes that she is "groggy".  No fever.  Her sister feels she is becoming more short of breath and notes the heart rate has become more elevated over time.  No significant nausea.   Past Medical History:  Diagnosis Date  . GERD (gastroesophageal reflux disease)   . Hiatal hernia   . Liver lesion   . Medical history non-contributory   . Pancreatic mass   :  Past Surgical History:  Procedure Laterality Date   . ERCP N/A 07/24/2019   Procedure: ENDOSCOPIC RETROGRADE CHOLANGIOPANCREATOGRAPHY (ERCP);  Surgeon: Gatha Mayer, MD;  Location: [facility];  Service: Endoscopy;  Laterality: N/A;  . IR EXCHANGE BILIARY DRAIN  07/28/2019  . IR IMAGING GUIDED PORT INSERTION  07/14/2019  . IR IMAGING GUIDED PORT INSERTION  07/28/2019  . IR INT EXT BILIARY DRAIN WITH CHOLANGIOGRAM  07/25/2019  . IR REMOVAL TUN ACCESS W/ PORT W/O FL MOD SED  07/28/2019  :  No current facility-administered medications for this encounter.  Current Outpatient Medications:  .  amLODipine (NORVASC) 10 MG tablet, Take 1 tablet (10 mg total) by mouth daily., Disp: 30 tablet, Rfl: 1 .  lidocaine-prilocaine (EMLA) cream, Apply 1 application topically as needed. Apply 1 hour prior to stick and cover with plastic wrap, Disp: 30 g, Rfl: 1 .  loperamide (IMODIUM) 2 MG capsule, Take 1 capsule (2 mg total) by mouth as needed for diarrhea or loose stools., Disp: 30 capsule, Rfl: 0 .  LORazepam (ATIVAN) 0.5 MG tablet, Take 1 tablet (0.5 mg total) by mouth every 6 (six) hours as needed for up to 15 doses (nausea/vomiting. May give PO or SL.)., Disp: 15 tablet, Rfl: 0 .  metoCLOPramide (REGLAN) 5 MG tablet, Take 1 tablet (5 mg total) by mouth 3 (three) times daily before meals., Disp: 90 tablet, Rfl: 0 .  Multiple Vitamins-Minerals (MULTIVITAMIN WITH MINERALS) tablet, Take 1 tablet by mouth daily., Disp: , Rfl:  .  ondansetron (ZOFRAN-ODT) 4 MG disintegrating tablet, Take 1 tablet (4  mg total) by mouth every 8 (eight) hours as needed for up to 30 doses for nausea or vomiting., Disp: 20 tablet, Rfl: 0 .  oxyCODONE (OXY IR/ROXICODONE) 5 MG immediate release tablet, Take 1 tablet (5 mg total) by mouth every 4 (four) hours as needed for up to 15 doses for moderate pain., Disp: 15 tablet, Rfl: 0 .  oxyCODONE (OXYCONTIN) 10 mg 12 hr tablet, Take 1 tablet (10 mg total) by mouth at bedtime for 7 doses., Disp: 7 tablet, Rfl: 0 .  oxyCODONE (OXYCONTIN) 20 mg  12 hr tablet, Take 1 tablet (20 mg total) by mouth every morning for 7 doses., Disp: 7 tablet, Rfl: 0 .  pantoprazole (PROTONIX) 40 MG tablet, Take 1 tablet (40 mg total) by mouth daily., Disp: 30 tablet, Rfl: 0 .  polyethylene glycol (MIRALAX / GLYCOLAX) 17 g packet, Take 17 g by mouth daily as needed for mild constipation., Disp: 14 each, Rfl: 0 .  predniSONE (DELTASONE) 10 MG tablet, Take 1 tablet (10 mg total) by mouth daily with breakfast., Disp: 30 tablet, Rfl: 0 .  prochlorperazine (COMPAZINE) 10 MG tablet, Take 1 tablet (10 mg total) by mouth every 6 (six) hours as needed for nausea., Disp: 60 tablet, Rfl: 1:  :  No Known Allergies:  Family History  Problem Relation Age of Onset  . Pancreatic cancer Father   . Lung cancer Paternal Uncle      reports that she has never smoked. She has never used smokeless tobacco. She reports previous alcohol use. She reports that she does not use drugs.  ROS: Review of systems is obtained from both Ms. Sgro and her sister.  Ms. Dileonardo denies fever.  No pain at present.  She is currently taking OxyContin twice a day.  She rarely requires breakthrough pain medication.  She is weak.  Oral intake is very limited.  She is able to use the bedside commode with assistance.  Her family had to slide her to the floor recently to avoid a fall.  It took 3 people to get her back to the bed.  Biliary drain seems to be working.  No confusion but sister notes that she is "groggy".  Ms. Rabon complains of shortness of breath.  Her sister notes this as well.  In addition she notes Ms. Lodico's heart rate has become more elevated over time.  No significant nausea.  Physical: Temperature 98.5, heart rate 127, respirations 18, blood pressure 102/80, oxygen saturation 95% on room air  General: Lethargic female. HEENT: Scleral icterus.  Significant thrush. Chest: Increased respiratory rate.  Decreased breath sounds with inspiratory rhonchi at the left lower posterior  chest Cardiovascular: Regular, tachycardic. Abdomen: Distended.  Biliary drainage catheter/collection bag. Extremities: Edema at the lower legs and feet. Neuro: Lethargic.  Arouses to voice, stimuli.  Answers questions appropriately. Skin: Jaundice. Port-A-Cath without erythema.  Resolving ecchymosis inferior to the Port-A-Cath. Labs:  Results for orders placed or performed in visit on 08/01/19 (from the past 48 hour(s))  CMP ([facility] only)     Status: Abnormal   Collection Time: 08/01/19 11:38 AM  Result Value Ref Range   Sodium 137 135 - 145 mmol/L   Potassium 3.5 3.5 - 5.1 mmol/L   Chloride 100 98 - 111 mmol/L   CO2 27 22 - 32 mmol/L   Glucose, Bld 251 (H) 70 - 99 mg/dL   BUN 19 8 - 23 mg/dL   Creatinine 0.84 0.44 - 1.00 mg/dL   Calcium 10.2 8.9 -  10.3 mg/dL   Total Protein 6.6 6.5 - 8.1 g/dL   Albumin 1.8 (L) 3.5 - 5.0 g/dL   AST 58 (H) 15 - 41 U/L   ALT 98 (H) 0 - 44 U/L   Alkaline Phosphatase 321 (H) 38 - 126 U/L   Total Bilirubin 13.3 (HH) 0.3 - 1.2 mg/dL    Comment: CRITICAL RESULT CALLED TO, READ BACK BY AND VERIFIED WITH: lorie bunton at 1238.rb    GFR, Est Non Af Am >60 >60 mL/min   GFR, Est AFR Am >60 >60 mL/min   Anion gap 10 5 - 15    Comment: Performed at [facility], [place] [address] [place], [place] [address]  CBC with Differential ([facility] Only)     Status: Abnormal   Collection Time: 08/01/19 11:38 AM  Result Value Ref Range   WBC Count 19.7 (H) 4.0 - 10.5 K/uL   RBC 4.17 3.87 - 5.11 MIL/uL   Hemoglobin 10.5 (L) 12.0 - 15.0 g/dL    Comment: Reticulocyte Hemoglobin testing may be clinically indicated, consider ordering this additional test [id number]    HCT 32.6 (L) 36.0 - 46.0 %   MCV 78.2 (L) 80.0 - 100.0 fL   MCH 25.2 (L) 26.0 - 34.0 pg   MCHC 32.2 30.0 - 36.0 g/dL   RDW 26.5 (H) 11.5 - 15.5 %   Platelet Count 111 (L) 150 - 400 K/uL   nRBC 0.8 (H) 0.0 - 0.2 %   Neutrophils Relative % 90 %   Neutro Abs 17.6 (H)  1.7 - 7.7 K/uL   Lymphocytes Relative 5 %   Lymphs Abs 1.0 0.7 - 4.0 K/uL   Monocytes Relative 4 %   Monocytes Absolute 0.7 0.1 - 1.0 K/uL   Eosinophils Relative 0 %   Eosinophils Absolute 0.1 0.0 - 0.5 K/uL   Basophils Relative 0 %   Basophils Absolute 0.1 0.0 - 0.1 K/uL   Immature Granulocytes 1 %   Abs Immature Granulocytes 0.23 (H) 0.00 - 0.07 K/uL    Comment: Performed at [facility], [address], [place], [place] [address]   No results found.  Assessment/Plan  1. Metastatic pancreatic adenocarcinoma -CT of the abdomen pelvis on 07/02/2019 showed a 3.4 x 2.2 x 3  cm mass in the head of the pancreas with multiple ill-defined  hypodense liver masses. -CT of the chest on 07/07/2019 showed no evidence of thoracic  metastasis. -Ultrasound guided liver biopsy on 07/07/2019 demonstrated  metastatic pancreatic adenocarcinoma. 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 Abdominal pain, nausea, vomiting secondary to pancreatic adenocarcinoma-improved 3.Red cell microcytosis-not anemic on hospital admission,? Chronicity, now with mild microcytic anemia 4. History of hypokalemia. 5. Uterine fibroids 6. Abdominal pain secondary to #1-improved 7. Nausea 8.Hyperbilirubinemia, due to a malignant common bile duct stricture  MRI/MRCP pancreas head mass, malignant common bile duct stricture at the level of the pancreas head dilatation of the proximal common bile duct intrahepatic biliary ductal dilatation leg appearing  nonocclusive thrombus at the SMV and vein.  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  Numerous liver metastases.  ERCP 07/24/2019-stent could not be placed.  Percutaneous biliary drain placement 07/24/2019.  9.  Port-A-Cath placed 07/14/2019, nonfunctional, new Port-A-Cath placed 07/28/2019 10.  Lethargy-OxyContin?,  Hepatic encephalopathy?,  Hypercalcemia?  Ms. Stair has metastatic pancreas cancer.  She was scheduled to begin treatment with gemcitabine/Abraxane 07/21/2019.  She was unexpectedly found to have significant hyperbilirubinemia.  She was hospitalized 07/21/2019 through 07/29/2019.  A percutaneous biliary drain was placed during the hospitalization.  She presents today for routine follow-up since hospital discharge.  She has significant failure to thrive, extremely poor performance status.  In her current condition her family is unable to manage care at home.  She is being admitted for further evaluation.    We discussed CODE STATUS.  She is a FULL CODE.  Ned Card ANP/GNP-BC 08/01/2019, 12:59 PM   Ms. Woolf has a recent diagnosis of metastatic pancreas cancer.  Her performance status is very poor.  She has persistent hyperbilirubinemia.  Her sister is unable to care for Ms. Arnesen at home.  She will be admitted for further evaluation including evaluation of dyspnea and altered mental status.  We will check an ammonia level, hold OxyContin, and check an ionized calcium level.  She will have a chest x-ray on hospital admission.  I explained the poor prognosis to her sister.  She understands the likelihood of survival beyond days to a few weeks is poor.  Ms. Lichte would like to remain on a full CODE STATUS for now.  We will continue discussions regarding the prognosis and CODE STATUS.  She may be a candidate for transfer to [facility] if her condition does not improve over the next several days.

## 2019-08-01 NOTE — Telephone Encounter (Signed)
Rhonda from lab called with patients total bilirubin of 13.3.  This nurse spoke with LT/NP to inform of results.

## 2019-08-01 NOTE — Progress Notes (Signed)
Went to access port and noticed that incision was open on right side with slight bleeding. Went and got PA [place] who assessed site and put steri strips on site to close incision. Then accessed port with no complications.  placed a guaze over incision and steri strips and then covered everything with a dressing.

## 2019-08-01 NOTE — Telephone Encounter (Signed)
Per Ned Card NP called interventional Radiology  [phone]) to let them know that when I went to access port and noticed that incision was open on right side with slight bleeding. Went and got PA [place] who assessed site and put steri strips on site to close incision. Then accessed port with no complications.  placed a guaze over incision and steri strips and then covered everything with a dressing. I let them know that patient was in room 1615 on the 6th floor of [facility] and they stated that they would let their PA know so that it can be assessed again.

## 2019-08-01 NOTE — Progress Notes (Signed)
   Vital Signs MEWS/VS Documentation      08/01/2019 1458         MEWS Score:  2   MEWS Score Color:  Yellow   Resp:  (!) 22   Pulse:  (!) 104   BP:  108/67   Temp:  97.7 F (36.5 C)       Notified Ned Card, NP patient placed on yellow mews. Patient stable, will continue to monitor.    Chrystian Ressler A Londell Noll 08/01/2019,4:50 PM

## 2019-08-01 NOTE — Progress Notes (Signed)
Patient ID: Rachel Vasquez, female   DOB: 12-Sep-1955, 64 y.o.   MRN: [id number] Patient with history of pancreatic carcinoma/biliary obstruction/hyperbilirubinemia, status post internal/external biliary drain on 07/25/2019 with exchange on 1/25;  also with Port-A-Cath placement on 1/25.  Patient currently resting quietly, does have some intermittent nausea and abdominal tenderness.  Afebrile, slightly tachy, WBC 19.7, total bilirubin 13.3 down slightly from 14.4; continue drain irrigation/output monitoring/lab checks; note made from oncology of some bleeding from Port-A-Cath incision site; site currently covered with Steri-Strips and no evidence of active bleeding- cont to monitor; other plans as per oncology

## 2019-08-01 NOTE — Progress Notes (Addendum)
[place] CSW Progress Notes  Urgent referral received from [facility] regarding patient's home care needs.  Called patient, sister/caregiver answered.  Sister would like calls be made to her as she has primary caregiving duties w patient.  Concerned that home health has not been set up as promised.  Stated she was promised that home health PT would be in place at hospital discharge.  Per notes from Estrellita Ludwig, RN CM from inpatient [facility] team, patient declined [facility] visits as they have a copay.  Sister states [facility] oncology office is trying to set up [facility] - have been declined by [facility], she is unsure what else is planned.  Has DME -  wheelchair and walker from [employer] (sister states it is an "antiquated version of one").  It is taking a significant amount of time to provide physical care needed for patient to be maintained at home.  Per sister, patient's leg strength has declined since hospital discharge and she is now unable to walk/stand.  They are heading to appt w Dr Benay Spice this morning, requested CSW call this afternoon.  Staff message sent to desk RN.    REviewed notes from todays [facility] visit - noted patient is being admitted to inpatient. CSW will sign off at this time, defer to inpatient [facility] team.  Pls consult if assistance is needed from outpatient CSW.  Edwyna Shell, LCSW Clinical Social Worker Phone:  [phone] Cell:  [phone]

## 2019-08-01 NOTE — Progress Notes (Signed)
See admission history and physical.

## 2019-08-02 DIAGNOSIS — C259 Malignant neoplasm of pancreas, unspecified: Secondary | ICD-10-CM

## 2019-08-02 DIAGNOSIS — R739 Hyperglycemia, unspecified: Secondary | ICD-10-CM

## 2019-08-02 DIAGNOSIS — C787 Secondary malignant neoplasm of liver and intrahepatic bile duct: Secondary | ICD-10-CM

## 2019-08-02 DIAGNOSIS — K831 Obstruction of bile duct: Principal | ICD-10-CM

## 2019-08-02 DIAGNOSIS — C801 Malignant (primary) neoplasm, unspecified: Secondary | ICD-10-CM

## 2019-08-02 LAB — GLUCOSE, CAPILLARY
Glucose-Capillary: 255 mg/dL — ABNORMAL HIGH (ref 70–99)
Glucose-Capillary: 197 mg/dL — ABNORMAL HIGH (ref 70–99)
Glucose-Capillary: 201 mg/dL — ABNORMAL HIGH (ref 70–99)

## 2019-08-02 LAB — CBC WITH DIFFERENTIAL/PLATELET
Abs Immature Granulocytes: 0.16 10*3/uL — ABNORMAL HIGH (ref 0.00–0.07)
Basophils Absolute: 0 10*3/uL (ref 0.0–0.1)
Basophils Relative: 0 %
Eosinophils Absolute: 0.1 10*3/uL (ref 0.0–0.5)
Eosinophils Relative: 1 %
HCT: 27.5 % — ABNORMAL LOW (ref 36.0–46.0)
Hemoglobin: 8.7 g/dL — ABNORMAL LOW (ref 12.0–15.0)
Immature Granulocytes: 1 %
Lymphocytes Relative: 6 %
Lymphs Abs: 1 10*3/uL (ref 0.7–4.0)
MCH: 25.3 pg — ABNORMAL LOW (ref 26.0–34.0)
MCHC: 31.6 g/dL (ref 30.0–36.0)
MCV: 79.9 fL — ABNORMAL LOW (ref 80.0–100.0)
Monocytes Absolute: 0.6 10*3/uL (ref 0.1–1.0)
Monocytes Relative: 3 %
Neutro Abs: 14.3 10*3/uL — ABNORMAL HIGH (ref 1.7–7.7)
Neutrophils Relative %: 89 %
Platelets: 99 10*3/uL — ABNORMAL LOW (ref 150–400)
RBC: 3.44 MIL/uL — ABNORMAL LOW (ref 3.87–5.11)
RDW: 26.1 % — ABNORMAL HIGH (ref 11.5–15.5)
WBC: 16.1 10*3/uL — ABNORMAL HIGH (ref 4.0–10.5)
nRBC: 0.3 % — ABNORMAL HIGH (ref 0.0–0.2)

## 2019-08-02 LAB — COMPREHENSIVE METABOLIC PANEL
ALT: 108 U/L — ABNORMAL HIGH (ref 0–44)
AST: 95 U/L — ABNORMAL HIGH (ref 15–41)
Albumin: 1.6 g/dL — ABNORMAL LOW (ref 3.5–5.0)
Alkaline Phosphatase: 220 U/L — ABNORMAL HIGH (ref 38–126)
Anion gap: 8 (ref 5–15)
BUN: 26 mg/dL — ABNORMAL HIGH (ref 8–23)
CO2: 27 mmol/L (ref 22–32)
Calcium: 9.5 mg/dL (ref 8.9–10.3)
Chloride: 101 mmol/L (ref 98–111)
Creatinine, Ser: 0.89 mg/dL (ref 0.44–1.00)
GFR calc Af Amer: >60 mL/min (ref >60)
GFR calc non Af Amer: >60 mL/min (ref >60)
Glucose, Bld: 270 mg/dL — ABNORMAL HIGH (ref 70–99)
Potassium: 3.4 mmol/L — ABNORMAL LOW (ref 3.5–5.1)
Sodium: 136 mmol/L (ref 135–145)
Total Bilirubin: 11.3 mg/dL — ABNORMAL HIGH (ref 0.3–1.2)
Total Protein: 5.6 g/dL — ABNORMAL LOW (ref 6.5–8.1)

## 2019-08-02 LAB — CALCIUM, IONIZED: Calcium, Ionized, Serum: 5.9 mg/dL — ABNORMAL HIGH (ref 4.5–5.6)

## 2019-08-02 MED ORDER — POTASSIUM CHLORIDE CRYS ER 20 MEQ PO TBCR
20.0000 meq | EXTENDED_RELEASE_TABLET | Freq: Once | ORAL | Status: AC
  Administered 2019-08-02: 20 meq via ORAL
  Filled 2019-08-02: qty 1

## 2019-08-02 MED ORDER — LACTULOSE 10 GM/15ML PO SOLN
10.0000 g | Freq: Once | ORAL | Status: AC
  Administered 2019-08-02: 10 g via ORAL
  Filled 2019-08-02: qty 15

## 2019-08-02 MED ORDER — INSULIN ASPART 100 UNIT/ML ~~LOC~~ SOLN
0.0000 [IU] | Freq: Three times a day (TID) | SUBCUTANEOUS | Status: DC
  Administered 2019-08-02: 12:00:00 8 [IU] via SUBCUTANEOUS
  Administered 2019-08-02 – 2019-08-05 (×9): 3 [IU] via SUBCUTANEOUS
  Administered 2019-08-05: 8 [IU] via SUBCUTANEOUS
  Administered 2019-08-06 – 2019-08-07 (×6): 3 [IU] via SUBCUTANEOUS
  Administered 2019-08-09: 2 [IU] via SUBCUTANEOUS
  Administered 2019-08-09: 3 [IU] via SUBCUTANEOUS

## 2019-08-02 MED ORDER — LACTULOSE 10 GM/15ML PO SOLN: 10.0000 g | Freq: Two times a day (BID) | ORAL | Status: DC | PRN

## 2019-08-02 MED ORDER — CHLORHEXIDINE GLUCONATE CLOTH 2 % EX PADS
6.0000 | MEDICATED_PAD | Freq: Every day | CUTANEOUS | Status: DC
  Administered 2019-08-02 – 2019-08-08 (×7): 6 via TOPICAL

## 2019-08-02 MED ORDER — SENNA 8.6 MG PO TABS: 1.0000 | ORAL_TABLET | Freq: Two times a day (BID) | ORAL | Status: DC | PRN

## 2019-08-02 MED ORDER — DOCUSATE SODIUM 100 MG PO CAPS
100.0000 mg | ORAL_CAPSULE | Freq: Two times a day (BID) | ORAL | Status: DC
  Administered 2019-08-02 – 2019-08-06 (×5): 100 mg via ORAL
  Filled 2019-08-02 (×8): qty 1

## 2019-08-02 MED ORDER — MEGESTROL ACETATE 400 MG/10ML PO SUSP
400.0000 mg | Freq: Every day | ORAL | Status: DC
  Administered 2019-08-02 – 2019-08-06 (×4): 400 mg via ORAL
  Filled 2019-08-02 (×5): qty 10

## 2019-08-02 NOTE — Progress Notes (Signed)
PT Cancellation Note  Patient Details Name: Varenya Vanhoesen MRN: [id number] DOB: 1956-05-27   Cancelled Treatment:    Reason Eval/Treat Not Completed: Other (comment)(pt is being bathed by CNA. Will follow.)  Philomena Doheny PT 08/02/2019  Acute Rehabilitation Services Pager [phone] Office [phone]

## 2019-08-02 NOTE — Evaluation (Signed)
Physical Therapy Evaluation Patient Details Name: Rachel Vasquez MRN: [id number] DOB: Jun 16, 1956 Today's Date: 08/02/2019   History of Present Illness  64 year old woman with recently diagnosed metastatic pancreas cancer. Recent hospitalization with biliary obstruction 07/21/19-07/29/19, s/p ERCP 07/24/19. Now readmitted for lethargy, dyspnea, weakness.  Clinical Impression  Pt admitted with above diagnosis. Max assist for supine to sit, pt sat at edge of bed x 6 minutes. Did not attempt sit to stand 2* pt fatigue. Performed BLE strengthening exercise in supine, encouraged pt to perform these several times a day to minimize further deconditioning. Pt has flat affect and is quite deconditioned. She kept her eyes closed during PT session, but did put forth good effort.  Pt's sister present for PT eval, she stated goal is for PT to DC to SNF for rehab.  Pt currently with functional limitations due to the deficits listed below (see PT Problem List). Pt will benefit from skilled PT to increase their independence and safety with mobility to allow discharge to the venue listed below.       Follow Up Recommendations Supervision/Assistance - 24 hour;SNF;Supervision for mobility/OOB    Equipment Recommendations  Other (comment)(defer to SNF)    Recommendations for Other Services       Precautions / Restrictions Precautions Precautions: Fall Precaution Comments: biliary drain Restrictions Weight Bearing Restrictions: No      Mobility  Bed Mobility Overal bed mobility: Needs Assistance Bed Mobility: Supine to Sit     Supine to sit: Max assist Sit to supine: Total assist;+2 for physical assistance   General bed mobility comments: assist to raise trunk and pivot hips to edge of bed (pt 30%), pt sat edge of bed x 6 minutes  Transfers                 General transfer comment: NT- pt fatigued after sitting on edge of bed x 6 minutes, she declined sit to stand  Ambulation/Gait              General Gait Details: Side steps along side of bed with RW for safety. Pt was then able to take a few more steps to get to recliner.  Stairs            Wheelchair Mobility    Modified Rankin (Stroke Patients Only)       Balance Overall balance assessment: Needs assistance Sitting-balance support: Feet supported;Bilateral upper extremity supported Sitting balance-Leahy Scale: Fair         Standing balance comment: NT                             Pertinent Vitals/Pain Pain Assessment: No/denies pain    Home Living Family/patient expects to be discharged to:: Skilled nursing facility Living Arrangements: Children Available Help at Discharge: Family;Available 24 hours/day(sister from [place]) Type of Home: Apartment Home Access: Level entry     Home Layout: One level Home Equipment: Walker - 2 wheels;Bedside commode Additional Comments: Lives with son who is in and out, but states sister will be coming to stay with her for 2 months and will be available 24/7.     Prior Function Level of Independence: Independent         Comments: Retired from [employer]; independent at baseline, after recent DC from hospital pt could perform stand pivot transfer to 3 in 1 but couldn't walk     Hand Dominance   Dominant Hand: Right    Extremity/Trunk  Assessment   Upper Extremity Assessment Upper Extremity Assessment: Defer to OT evaluation    Lower Extremity Assessment Lower Extremity Assessment: Generalized weakness(L knee ext +3/5, R knee ext 3/5)    Cervical / Trunk Assessment Cervical / Trunk Assessment: Normal  Communication   Communication: No difficulties  Cognition Arousal/Alertness: Awake/alert Behavior During Therapy: Flat affect Overall Cognitive Status: Within Functional Limits for tasks assessed                                        General Comments      Exercises General Exercises - Lower Extremity Ankle  Circles/Pumps: AROM;Both;15 reps;Supine Quad Sets: AROM;Both;5 reps;Supine Gluteal Sets: AROM;Both;5 reps;Supine Long Arc Quad: AROM;Both;10 reps;Seated Heel Slides: AAROM;Both;10 reps;Supine Hip ABduction/ADduction: AAROM;Both;10 reps;Supine   Assessment/Plan    PT Assessment Patient needs continued PT services  PT Problem List Decreased strength;Decreased mobility;Decreased activity tolerance;Decreased balance       PT Treatment Interventions Gait training;Functional mobility training;Therapeutic activities;Therapeutic exercise;Balance training;Patient/family education    PT Goals (Current goals can be found in the Care Plan section)  Acute Rehab PT Goals Patient Stated Goal: go to rehab to get strong enough to walk PT Goal Formulation: With patient/family Time For Goal Achievement: 08/16/19 Potential to Achieve Goals: Fair    Frequency Min 2X/week   Barriers to discharge        Co-evaluation               AM-PAC PT "6 Clicks" Mobility  Outcome Measure Help needed turning from your back to your side while in a flat bed without using bedrails?: A Lot Help needed moving from lying on your back to sitting on the side of a flat bed without using bedrails?: A Lot Help needed moving to and from a bed to a chair (including a wheelchair)?: Total Help needed standing up from a chair using your arms (e.g., wheelchair or bedside chair)?: Total Help needed to walk in hospital room?: Total Help needed climbing 3-5 steps with a railing? : Total 6 Click Score: 8    End of Session   Activity Tolerance: Patient limited by fatigue Patient left: with call bell/phone within reach;in bed;with bed alarm set;with family/visitor present Nurse Communication: Mobility status;Need for lift equipment PT Visit Diagnosis: Muscle weakness (generalized) (M62.81);Difficulty in walking, not elsewhere classified (R26.2)    Time: [id number] PT Time Calculation (min) (ACUTE ONLY): 24  min   Charges:   PT Evaluation $PT Eval Moderate Complexity: 1 Mod PT Treatments $Therapeutic Activity: 8-22 mins        Blondell Reveal Kistler PT 08/02/2019  Acute Rehabilitation Services Pager [phone] Office [phone]

## 2019-08-02 NOTE — Progress Notes (Signed)
Referring Physician(s): Dr Burr Medico, Krista Blue  Supervising Physician: Sandi Mariscal  Patient Status:  [facility] - In-pt  Chief Complaint:  Pancreatic carcinoma/biliary obstruction/hyperbilirubinemia, status post internal/external biliary drain on 07/25/2019 with exchange on 1/25 PAC placed 1/25  Subjective:  Sitting up on side of bed with PT Tired Helped into bed Pt feels better and more alert today   Allergies: Patient has no known allergies.  Medications: Prior to Admission medications   Medication Sig Start Date End Date Taking? Authorizing Provider  amLODipine (NORVASC) 10 MG tablet Take 1 tablet (10 mg total) by mouth daily. 07/18/19 09/16/19 Yes Antonieta Pert, MD  lidocaine-prilocaine (EMLA) cream Apply 1 application topically as needed. Apply 1 hour prior to stick and cover with plastic wrap 07/16/19  Yes Ladell Pier, MD  LORazepam (ATIVAN) 0.5 MG tablet Take 1 tablet (0.5 mg total) by mouth every 6 (six) hours as needed for up to 15 doses (nausea/vomiting. May give PO or SL.). 07/17/19  Yes Kc, Maren Beach, MD  metoCLOPramide (REGLAN) 5 MG tablet Take 1 tablet (5 mg total) by mouth 3 (three) times daily before meals. 07/17/19 08/16/19 Yes Antonieta Pert, MD  Multiple Vitamins-Minerals (MULTIVITAMIN WITH MINERALS) tablet Take 1 tablet by mouth daily.   Yes [provider]  ondansetron (ZOFRAN-ODT) 4 MG disintegrating tablet Take 1 tablet (4 mg total) by mouth every 8 (eight) hours as needed for up to 30 doses for nausea or vomiting. 07/17/19  Yes Antonieta Pert, MD  oxyCODONE (OXY IR/ROXICODONE) 5 MG immediate release tablet Take 1 tablet (5 mg total) by mouth every 4 (four) hours as needed for up to 15 doses for moderate pain. 07/17/19  Yes Antonieta Pert, MD  oxyCODONE (OXYCONTIN) 10 mg 12 hr tablet Take 1 tablet (10 mg total) by mouth at bedtime for 7 doses. 07/29/19 08/05/19 Yes Curcio, Roselie Awkward, NP  oxyCODONE (OXYCONTIN) 20 mg 12 hr tablet Take 1 tablet (20 mg total) by mouth every morning for 7  doses. 07/29/19 08/05/19 Yes Curcio, Roselie Awkward, NP  pantoprazole (PROTONIX) 40 MG tablet Take 1 tablet (40 mg total) by mouth daily. 07/17/19 08/16/19 Yes Antonieta Pert, MD  polyethylene glycol (MIRALAX / GLYCOLAX) 17 g packet Take 17 g by mouth daily as needed for mild constipation. 07/17/19  Yes Antonieta Pert, MD  predniSONE (DELTASONE) 10 MG tablet Take 1 tablet (10 mg total) by mouth daily with breakfast. 07/18/19 08/17/19 Yes Kc, Maren Beach, MD  prochlorperazine (COMPAZINE) 10 MG tablet Take 1 tablet (10 mg total) by mouth every 6 (six) hours as needed for nausea. 07/16/19  Yes Ladell Pier, MD  loperamide (IMODIUM) 2 MG capsule Take 1 capsule (2 mg total) by mouth as needed for diarrhea or loose stools. 07/29/19   Maryanna Shape, NP     Vital Signs: BP 130/70 (BP Location: Right Arm)   Pulse 88   Temp 98.4 F (36.9 C) (Oral)   Resp 20   Ht 5\' 7"  (1.702 m)   Wt 241 lb 1.6 oz (109.4 kg)   SpO2 94%   BMI 37.76 kg/m   Physical Exam Skin:    General: Skin is warm and dry.     Comments: Site of drain is clean and dry NT no bleeding OP bilious  PAC intact and no bleeding      Imaging: X-ray chest PA and lateral  Result Date: 08/01/2019 CLINICAL DATA:  Shortness of breath. EXAM: CHEST - 2 VIEW COMPARISON:  None. FINDINGS: A right Port-A-Cath terminates in  the central SVC. Bibasilar opacities have platelike components. No pneumothorax. The cardiomediastinal silhouette is stable. No other acute abnormalities. IMPRESSION: Bibasilar platelike opacities likely represent atelectasis. Underlying infiltrates not excluded. Electronically Signed   By: Dorise Bullion III M.D   On: 08/01/2019 19:20    Labs:  CBC: Recent Labs    07/28/19 0459 07/29/19 0916 08/01/19 1138 08/02/19 0500  WBC 14.5* 14.4* 19.7* 16.1*  HGB 9.3* 9.0* 10.5* 8.7*  HCT 28.7* 28.9* 32.6* 27.5*  PLT 161 139* 111* 99*    COAGS: Recent Labs    07/07/19 0050 07/14/19 0621 07/25/19 0731  INR 1.2 1.3* 2.2*     BMP: Recent Labs    07/28/19 0459 07/29/19 0916 08/01/19 1138 08/02/19 0500  NA 138 138 137 136  K 3.3* 3.5 3.5 3.4*  CL 102 101 100 101  CO2 25 28 27 27   GLUCOSE 148* 227* 251* 270*  BUN 11 15 19  26*  CALCIUM 9.5 10.0 10.2 9.5  CREATININE 0.39* 0.37* 0.84 0.89  GFRNONAA >60 >60 >60 >60  GFRAA >60 >60 >60 >60    LIVER FUNCTION TESTS: Recent Labs    07/28/19 0459 07/29/19 0916 08/01/19 1138 08/02/19 0500  BILITOT 13.4* 14.4* 13.3* 11.3*  AST 51* 52* 58* 95*  ALT 111* 94* 98* 108*  ALKPHOS 380* 356* 321* 220*  PROT 6.0* 6.4* 6.6 5.6*  ALBUMIN 2.0* 1.9* 1.8* 1.6*    Assessment and Plan:  Biliary drain intact PAC dry; nt Will follow   Electronically Signed: Lavonia Drafts, PA-C 08/02/2019, 2:52 PM   I spent a total of 15 Minutes at the the patient's bedside AND on the patient's hospital floor or unit, greater than 50% of which was counseling/coordinating care for biliary drain; PAC intact

## 2019-08-02 NOTE — Progress Notes (Addendum)
Rachel Vasquez   DOB:[birth year]   [id number]   [id number]  Oncology follow up note   Subjective: I am covering Dr. Benay Spice to see her.  Patient was admitted from our office to [place] yesterday for lethargy and dyspnea.  She is awake, alert this morning, did not sleep much last night.  Her abdominal pain is moderate, she has been reluctant to take the pain medication.  Her last bowel movement was 3 to 4 days ago.  She is afebrile, vital signs stable.  She has not eaten any food, did drink a bottle of boost this morning. Her urine is very dark, and her urine output has been very low since admission.   Objective:  Vitals:   08/01/19 2230 08/02/19 0553  BP: 115/72 (!) 142/72  Pulse: (!) 101 88  Resp: 18 18  Temp: 98.2 F (36.8 C) 98.4 F (36.9 C)  SpO2: 99% 98%    Body mass index is 37.76 kg/m.  Intake/Output Summary (Last 24 hours) at 08/02/2019 1041 Last data filed at 08/02/2019 0620 Gross per 24 hour  Intake 543.37 ml  Output 275 ml  Net 268.37 ml     Sclerae icteric, drowsy, chronic ill-appearing  Oropharynx clear  Lungs clear -- no rales or rhonchi  Heart regular rate and rhythm  Abdomen soft, diffuse mild tenderness   (+) b/l LE edema up to knee   Neuro awake, alert, oriented. Speech is slow but able to answer questions    CBG (last 3)  No results for input(s): GLUCAP in the last 72 hours.   Labs:  Urine Studies No results for input(s): UHGB, CRYS in the last 72 hours.  Invalid input(s): UACOL, UAPR, USPG, UPH, UTP, UGL, UKET, UBIL, UNIT, UROB, ULEU, UEPI, UWBC, [place], [place]  Basic Metabolic Panel: Recent Labs  Lab 07/28/19 0459 07/28/19 0459 07/29/19 0916 07/29/19 0916 08/01/19 1138 08/02/19 0500  NA 138  --  138  --  137 136  K 3.3*   < > 3.5   < > 3.5 3.4*  CL 102  --  101  --  100 101  CO2 25  --  28  --  27 27  GLUCOSE 148*  --  227*  --  251* 270*  BUN 11  --  15  --  19 26*  CREATININE 0.39*  --  0.37*  --  0.84 0.89   CALCIUM 9.5  --  10.0  --  10.2 9.5   < > = values in this interval not displayed.   GFR Estimated Creatinine Clearance: 81.4 mL/min (by C-G formula based on SCr of 0.89 mg/dL). Liver Function Tests: Recent Labs  Lab 07/28/19 0459 07/29/19 0916 08/01/19 1138 08/02/19 0500  AST 51* 52* 58* 95*  ALT 111* 94* 98* 108*  ALKPHOS 380* 356* 321* 220*  BILITOT 13.4* 14.4* 13.3* 11.3*  PROT 6.0* 6.4* 6.6 5.6*  ALBUMIN 2.0* 1.9* 1.8* 1.6*   No results for input(s): LIPASE, AMYLASE in the last 168 hours. Recent Labs  Lab 08/01/19 1636  AMMONIA 61*   Coagulation profile No results for input(s): INR, PROTIME in the last 168 hours.  CBC: Recent Labs  Lab 07/26/19 1535 07/28/19 0459 07/29/19 0916 08/01/19 1138 08/02/19 0500  WBC  --  14.5* 14.4* 19.7* 16.1*  NEUTROABS  --  12.7* 12.9* 17.6* 14.3*  HGB 9.6* 9.3* 9.0* 10.5* 8.7*  HCT 29.4* 28.7* 28.9* 32.6* 27.5*  MCV  --  77.2* 79.4* 78.2* 79.9*  PLT  --  161 139* 111* 99*   Cardiac Enzymes: No results for input(s): CKTOTAL, CKMB, CKMBINDEX, TROPONINI in the last 168 hours. BNP: Invalid input(s): POCBNP CBG: No results for input(s): GLUCAP in the last 168 hours. D-Dimer No results for input(s): DDIMER in the last 72 hours. Hgb A1c No results for input(s): HGBA1C in the last 72 hours. Lipid Profile No results for input(s): CHOL, HDL, LDLCALC, TRIG, CHOLHDL, LDLDIRECT in the last 72 hours. Thyroid function studies No results for input(s): TSH, T4TOTAL, T3FREE, THYROIDAB in the last 72 hours.  Invalid input(s): FREET3 Anemia work up No results for input(s): VITAMINB12, FOLATE, FERRITIN, TIBC, IRON, RETICCTPCT in the last 72 hours. Microbiology Recent Results (from the past 240 hour(s))  Body fluid culture     Status: Abnormal   Collection Time: 07/29/19 10:52 AM   Specimen: BILE; Body Fluid  Result Value Ref Range Status   Specimen Description   Final    BILE Performed at [facility]  [address] [place], [place] [address]    Special Requests   Final    Normal Performed at [facility], [place] [address] [place], [place] [address]    Gram Stain   Final    NO WBC SEEN FEW GRAM NEGATIVE RODS Performed at [facility], [place] [address], [place], [place] [address]    Culture MULTIPLE ORGANISMS PRESENT, NONE PREDOMINANT (A)  Final   Report Status 07/31/2019 FINAL  Final  SARS CORONAVIRUS 2 (TAT 6-24 HRS) Nasopharyngeal Nasopharyngeal Swab     Status: None   Collection Time: 08/01/19  3:18 PM   Specimen: Nasopharyngeal Swab  Result Value Ref Range Status   SARS Coronavirus 2 NEGATIVE NEGATIVE Final    Comment: (NOTE) SARS-CoV-2 target nucleic acids are NOT DETECTED. The SARS-CoV-2 RNA is generally detectable in upper and lower respiratory specimens during the acute phase of infection. Negative results do not preclude SARS-CoV-2 infection, do not rule out co-infections with other pathogens, and should not be used as the sole basis for treatment or other patient management decisions. Negative results must be combined with clinical observations, patient history, and epidemiological information. The expected result is Negative. Fact Sheet for Patients: SugarRoll.be Fact Sheet for Healthcare Providers: https://www.woods-mathews.com/ This test is not yet approved or cleared by the [place] FDA and  has been authorized for detection and/or diagnosis of SARS-CoV-2 by FDA under an Emergency Use Authorization (EUA). This EUA will remain  in effect (meaning this test can be used) for the duration of the COVID-19 declaration under Section 56 4(b)(1) of the Act, 21 U.S.C. section 360bbb-3(b)(1), unless the authorization is terminated or revoked sooner. Performed at [facility], [place] [address], [place], [place] [address]       Studies:  X-ray chest PA and lateral  Result Date: 08/01/2019 CLINICAL  DATA:  Shortness of breath. EXAM: CHEST - 2 VIEW COMPARISON:  None. FINDINGS: A right Port-A-Cath terminates in the central SVC. Bibasilar opacities have platelike components. No pneumothorax. The cardiomediastinal silhouette is stable. No other acute abnormalities. IMPRESSION: Bibasilar platelike opacities likely represent atelectasis. Underlying infiltrates not excluded. Electronically Signed   By: Dorise Bullion III M.D   On: 08/01/2019 19:20    Assessment: 64 y.o. with newly diagnosed metastatic pancreatic cancer and severe jaundice  1. Failure to thrive, secondary to #2 2.  Metastatic pancreatic cancer to liver, status post biliary drain tube placement  3.  Abdominal pain, nausea, anorexia, secondary  to #2 4. Anemia of chronic disease  5.  Metabolic encephalopathy, improved 6. Hyperglycemia, likely related to steroids and pancreatic cancer  7. Hypokalemia  8. Full code    Plan:  -Encephalopathy has improved after hydration, given her elevated ammonia level, and constipation, I will start her on lactulose, and colace -will start her on insulin sliding scale today for her hyperglycemia  -lab reviewed, no need blood transfusion, tbil remains to be high, stable in past 4-5 days -CXR reviewed with pt and her sister, no definitive pleural effusion  -will continue holding long acting narcotics, and continue oxycodone as needed  -I discussed the goal of care with patient and her sister, due to her current condition, especially extremely poor oral intake and performance status, she will unlikely be a candidate for chemotherapy.  We discussed comfort care and hospice, patient sister was strongly against it, she wants pt to push for oral intake and PT -will get PT evaluation, although I do not think she can participate much  -dietician consult -I also discussed CODE STATUS with patient, and encouraged her to have a healthcare power of attorney.  She was think about it, she would like her younger  son who is in [place] to be her HCPOA.  -will try megace for her anorexia  -will f/u tomorrow     Truitt Merle, MD 08/02/2019  10:41 AM

## 2019-08-03 DIAGNOSIS — E876 Hypokalemia: Secondary | ICD-10-CM

## 2019-08-03 LAB — CBC WITH DIFFERENTIAL/PLATELET
Abs Immature Granulocytes: 0.24 10*3/uL — ABNORMAL HIGH (ref 0.00–0.07)
Basophils Absolute: 0 10*3/uL (ref 0.0–0.1)
Basophils Relative: 0 %
Eosinophils Absolute: 0.1 10*3/uL (ref 0.0–0.5)
Eosinophils Relative: 1 %
HCT: 26.7 % — ABNORMAL LOW (ref 36.0–46.0)
Hemoglobin: 8.3 g/dL — ABNORMAL LOW (ref 12.0–15.0)
Immature Granulocytes: 1 %
Lymphocytes Relative: 5 %
Lymphs Abs: 0.9 10*3/uL (ref 0.7–4.0)
MCH: 25.5 pg — ABNORMAL LOW (ref 26.0–34.0)
MCHC: 31.1 g/dL (ref 30.0–36.0)
MCV: 81.9 fL (ref 80.0–100.0)
Monocytes Absolute: 0.5 10*3/uL (ref 0.1–1.0)
Monocytes Relative: 3 %
Neutro Abs: 15 10*3/uL — ABNORMAL HIGH (ref 1.7–7.7)
Neutrophils Relative %: 90 %
Platelets: 93 10*3/uL — ABNORMAL LOW (ref 150–400)
RBC: 3.26 MIL/uL — ABNORMAL LOW (ref 3.87–5.11)
RDW: 26.2 % — ABNORMAL HIGH (ref 11.5–15.5)
WBC: 16.8 10*3/uL — ABNORMAL HIGH (ref 4.0–10.5)
nRBC: 0.3 % — ABNORMAL HIGH (ref 0.0–0.2)

## 2019-08-03 LAB — COMPREHENSIVE METABOLIC PANEL
ALT: 92 U/L — ABNORMAL HIGH (ref 0–44)
AST: 63 U/L — ABNORMAL HIGH (ref 15–41)
Albumin: 1.5 g/dL — ABNORMAL LOW (ref 3.5–5.0)
Alkaline Phosphatase: 201 U/L — ABNORMAL HIGH (ref 38–126)
Anion gap: 9 (ref 5–15)
BUN: 18 mg/dL (ref 8–23)
CO2: 26 mmol/L (ref 22–32)
Calcium: 9.3 mg/dL (ref 8.9–10.3)
Chloride: 103 mmol/L (ref 98–111)
Creatinine, Ser: 0.55 mg/dL (ref 0.44–1.00)
GFR calc Af Amer: >60 mL/min (ref >60)
GFR calc non Af Amer: >60 mL/min (ref >60)
Glucose, Bld: 196 mg/dL — ABNORMAL HIGH (ref 70–99)
Potassium: 3 mmol/L — ABNORMAL LOW (ref 3.5–5.1)
Sodium: 138 mmol/L (ref 135–145)
Total Bilirubin: 11.1 mg/dL — ABNORMAL HIGH (ref 0.3–1.2)
Total Protein: 5.6 g/dL — ABNORMAL LOW (ref 6.5–8.1)

## 2019-08-03 LAB — GLUCOSE, CAPILLARY
Glucose-Capillary: 165 mg/dL — ABNORMAL HIGH (ref 70–99)
Glucose-Capillary: 178 mg/dL — ABNORMAL HIGH (ref 70–99)
Glucose-Capillary: 183 mg/dL — ABNORMAL HIGH (ref 70–99)
Glucose-Capillary: 178 mg/dL — ABNORMAL HIGH (ref 70–99)

## 2019-08-03 MED ORDER — OXYCODONE HCL 5 MG PO TABS
2.5000 mg | ORAL_TABLET | ORAL | Status: DC | PRN
  Administered 2019-08-03 – 2019-08-09 (×10): 5 mg via ORAL
  Filled 2019-08-03 (×10): qty 1

## 2019-08-03 MED ORDER — ADULT MULTIVITAMIN W/MINERALS CH
1.0000 | ORAL_TABLET | Freq: Every day | ORAL | Status: DC
  Administered 2019-08-04 – 2019-08-06 (×3): 1 via ORAL
  Filled 2019-08-03 (×3): qty 1

## 2019-08-03 MED ORDER — POTASSIUM CHLORIDE 10 MEQ/100ML IV SOLN
10.0000 meq | INTRAVENOUS | Status: AC
  Administered 2019-08-03 (×2): 10 meq via INTRAVENOUS
  Filled 2019-08-03: qty 100

## 2019-08-03 NOTE — Progress Notes (Signed)
Rachel Vasquez   DOB:February 21, 1956   [id number]   [id number]  Oncology follow up note   Subjective: Rachel Vasquez is clinically stable, her urine output has significantly increased after IV hydration.  She reports recurrent hiccups today, and hallucination after taking oxycodone, her pain is controlled, she is reluctant to take oxycodone.  She was awake and alert when I saw her this morning.  Still has no appetite, and has not gotten out of bed today.  She did participate physical therapy evaluation yesterday.  Objective:  Vitals:   08/02/19 2136 08/03/19 0628  BP: 138/79 113/66  Pulse: (!) 106 93  Resp: 18 17  Temp: 99.2 F (37.3 C) 97.6 F (36.4 C)  SpO2: 93% 100%    Body mass index is 37.76 kg/m.  Intake/Output Summary (Last 24 hours) at 08/03/2019 1228 Last data filed at 08/03/2019 0400 Gross per 24 hour  Intake 2124.99 ml  Output 225 ml  Net 1899.99 ml     Sclerae icteric, drowsy, chronic ill-appearing  Oropharynx clear  Lungs clear -- no rales or rhonchi  Heart regular rate and rhythm  Abdomen soft, diffuse mild tenderness,.  Biliary draining tube in place, no leakage or tenderness   (+) b/l LE edema up to knee   Neuro awake, alert, oriented. Speech is slow but able to answer questions    CBG (last 3)  Recent Labs    08/02/19 2138 08/03/19 0808 08/03/19 1208  GLUCAP 201* 165* 178*     Labs:  Urine Studies No results for input(s): UHGB, CRYS in the last 72 hours.  Invalid input(s): UACOL, UAPR, USPG, UPH, UTP, UGL, [place], UBIL, UNIT, UROB, [place], UEPI, [place], [place]  Basic Metabolic Panel: Recent Labs  Lab 07/28/19 0459 07/28/19 0459 07/29/19 0916 07/29/19 0916 08/01/19 1138 08/01/19 1138 08/02/19 0500 08/03/19 0854  NA 138  --  138  --  137  --  136 138  K 3.3*   < > 3.5   < > 3.5   < > 3.4* 3.0*  CL 102  --  101  --  100  --  101 103  CO2 25  --  28  --  27  --  27 26  GLUCOSE 148*  --  227*  --  251*  --  270* 196*  BUN 11  --   15  --  19  --  26* 18  CREATININE 0.39*  --  0.37*  --  0.84  --  0.89 0.55  CALCIUM 9.5  --  10.0  --  10.2  --  9.5 9.3   < > = values in this interval not displayed.   GFR Estimated Creatinine Clearance: 90.5 mL/min (by C-G formula based on SCr of 0.55 mg/dL). Liver Function Tests: Recent Labs  Lab 07/28/19 0459 07/29/19 0916 08/01/19 1138 08/02/19 0500 08/03/19 0854  AST 51* 52* 58* 95* 63*  ALT 111* 94* 98* 108* 92*  ALKPHOS 380* 356* 321* 220* 201*  BILITOT 13.4* 14.4* 13.3* 11.3* 11.1*  PROT 6.0* 6.4* 6.6 5.6* 5.6*  ALBUMIN 2.0* 1.9* 1.8* 1.6* 1.5*   No results for input(s): LIPASE, AMYLASE in the last 168 hours. Recent Labs  Lab 08/01/19 1636  AMMONIA 61*   Coagulation profile No results for input(s): INR, PROTIME in the last 168 hours.  CBC: Recent Labs  Lab 07/28/19 0459 07/29/19 0916 08/01/19 1138 08/02/19 0500 08/03/19 0854  WBC 14.5* 14.4* 19.7* 16.1* 16.8*  NEUTROABS 12.7* 12.9* 17.6*  14.3* 15.0*  HGB 9.3* 9.0* 10.5* 8.7* 8.3*  HCT 28.7* 28.9* 32.6* 27.5* 26.7*  MCV 77.2* 79.4* 78.2* 79.9* 81.9  PLT 161 139* 111* 99* 93*   Cardiac Enzymes: No results for input(s): CKTOTAL, CKMB, CKMBINDEX, TROPONINI in the last 168 hours. BNP: Invalid input(s): POCBNP CBG: Recent Labs  Lab 08/02/19 1151 08/02/19 1653 08/02/19 2138 08/03/19 0808 08/03/19 1208  GLUCAP 255* 197* 201* 165* 178*   D-Dimer No results for input(s): DDIMER in the last 72 hours. Hgb A1c No results for input(s): HGBA1C in the last 72 hours. Lipid Profile No results for input(s): CHOL, HDL, LDLCALC, TRIG, CHOLHDL, LDLDIRECT in the last 72 hours. Thyroid function studies No results for input(s): TSH, T4TOTAL, T3FREE, THYROIDAB in the last 72 hours.  Invalid input(s): FREET3 Anemia work up No results for input(s): VITAMINB12, FOLATE, FERRITIN, TIBC, IRON, RETICCTPCT in the last 72 hours. Microbiology Recent Results (from the past 240 hour(s))  Body fluid culture     Status:  Abnormal   Collection Time: 07/29/19 10:52 AM   Specimen: BILE; Body Fluid  Result Value Ref Range Status   Specimen Description   Final    BILE Performed at [facility] [address] [place], [place] [address]    Special Requests   Final    Normal Performed at [facility], [place] [address] [place] [address] [place], [place] [address]    Gram Stain   Final    NO WBC SEEN FEW GRAM NEGATIVE RODS Performed at [facility], [place] [address] [place], [place] [address]    Culture MULTIPLE ORGANISMS PRESENT, NONE PREDOMINANT (A)  Final   Report Status 07/31/2019 FINAL  Final  SARS CORONAVIRUS 2 (TAT 6-24 HRS) Nasopharyngeal Nasopharyngeal Swab     Status: None   Collection Time: 08/01/19  3:18 PM   Specimen: Nasopharyngeal Swab  Result Value Ref Range Status   SARS Coronavirus 2 NEGATIVE NEGATIVE Final    Comment: (NOTE) SARS-CoV-2 target nucleic acids are NOT DETECTED. The SARS-CoV-2 RNA is generally detectable in upper and lower respiratory specimens during the acute phase of infection. Negative results do not preclude SARS-CoV-2 infection, do not rule out co-infections with other pathogens, and should not be used as the sole basis for treatment or other patient management decisions. Negative results must be combined with clinical observations, patient history, and epidemiological information. The expected result is Negative. Fact Sheet for Patients: SugarRoll.be Fact Sheet for Healthcare Providers: https://www.woods-mathews.com/ This test is not yet approved or cleared by the [place] FDA and  has been authorized for detection and/or diagnosis of SARS-CoV-2 by FDA under an Emergency Use Authorization (EUA). This EUA will remain  in effect (meaning this test can be used) for the duration of the COVID-19 declaration under Section 56 4(b)(1) of the Act, 21 U.S.C. section 360bbb-3(b)(1), unless the  authorization is terminated or revoked sooner. Performed at [facility], [place] [address] [place], [place] [address]       Studies:  X-ray chest PA and lateral  Result Date: 08/01/2019 CLINICAL DATA:  Shortness of breath. EXAM: CHEST - 2 VIEW COMPARISON:  None. FINDINGS: A right Port-A-Cath terminates in the central SVC. Bibasilar opacities have platelike components. No pneumothorax. The cardiomediastinal silhouette is stable. No other acute abnormalities. IMPRESSION: Bibasilar platelike opacities likely represent atelectasis. Underlying infiltrates not excluded. Electronically Signed   By: Dorise Bullion III M.D   On: 08/01/2019 19:20    Assessment: 64 y.o. with newly diagnosed  metastatic pancreatic cancer and severe jaundice  1. Failure to thrive, secondary to #2 2.  Metastatic pancreatic cancer to liver, status post biliary drain tube placement  3.  Abdominal pain, nausea, anorexia, secondary to #2 4. Anemia of chronic disease  5.  Metabolic encephalopathy, improved 6. Hyperglycemia, likely related to steroids and pancreatic cancer  7. Hypokalemia  8. Full code    Plan:  -She appears to be clinically stable, urine output has increased, she is still not eating much and very fatigued.  Encephalopathy has resolved. -I will decrease her IV fluids to 1 L today.  I encouraged her to get out of bed, and try to ambulate -Due to hiccups and hyperglycemia, I will stop her prednisone 10 mg daily.  -Hyperglycemia has improved, continue monitoring and sliding scale. -lab reviewed, overall stable, no need blood transfusion -will replace K with iv 41meq and continue oral KCL  -We discussed disposition.  Patient is concerned about the care burden to her sister if she goes home, her sister wants her to go to SNF. But I am not sure if SNF will take her due to her limited ability to participate physical therapy.  I again encouraged her to try.  -We discussed the option of palliative care  and hospice, including residential hospice.  Her sister was on the phone, and firmly declined. She would like to discuss with Dr. Benay Spice tomorrow. -pt also request a second opinion at [facility], I will pass along to Dr. Benay Spice. -I will consult case manager, for SNF placement.    Truitt Merle, MD 08/03/2019  12:28 PM

## 2019-08-03 NOTE — Progress Notes (Signed)
Initial Nutrition Assessment  DOCUMENTATION CODES:   Obesity unspecified  INTERVENTION:   Vital Cuisine TID, each supplement provides 520kcal and 22g of protein.   Magic cup TID with meals, each supplement provides 290 kcal and 9 grams of protein  MVI daily   NUTRITION DIAGNOSIS:   Increased nutrient needs related to cancer and cancer related treatments as evidenced by increased estimated needs.  GOAL:   Patient will meet greater than or equal to 90% of their needs  MONITOR:   PO intake, Supplement acceptance, Labs, Weight trends, Skin, I & O's  REASON FOR ASSESSMENT:   Consult Assessment of nutrition requirement/status  ASSESSMENT:   64 year old woman with recently diagnosed metastatic pancreas cancer. Recent hospitalization with biliary obstruction 07/21/19-07/29/19, s/p ERCP 07/24/19. Now readmitted for lethargy, dyspnea and weakness.  RD working remotely.  Pt continues to have very poor appetite and oral intake. Megace initiated to help with appetite stimulant. Pt has been non-compliant and refusing all supplements; pt has refused Boost, Ensure and Prostat. Pt has also been reluctant to the idea of a feeding tube and would like to avoid this if at all possible. RD will add vital cuisine and magic cups to meal trays. Would recommend increasing megace to twice daily. Per chart, pt has lost 7bs(3%) over the past month; this is not significant. Pt with poor prognosis.  Medications reviewed and include: colace, lovenox, diflucan, insulin, megace, protonix   Labs reviewed: K 3.0(L), Alk Phos 201(H), AST 63(H), ALT 9(H), tbili 11.1(H) Wbc- 16.8(H), Hgb 8.3(L), Hct 26.7(L) cbgs- 255, 197, 201, 165, 178 x 24 hrs  Unable to complete Nutrition-Focused physical exam at this time.   Diet Order:   Diet Order            Diet regular Room service appropriate? Yes; Fluid consistency: Thin  Diet effective now             EDUCATION NEEDS:   Education needs have been  addressed  Skin:  Skin Assessment: Reviewed RN Assessment(ecchymosis, incision abdomen)  Last BM:  1/27- type 6  Height:   Ht Readings from Last 1 Encounters:  08/01/19 5' 7"  (1.702 m)    Weight:   Wt Readings from Last 1 Encounters:  08/01/19 109.4 kg    Ideal Body Weight:  61.36 kg  BMI:  Body mass index is 37.76 kg/m.  Estimated Nutritional Needs:   Kcal:  2200-2500kcal/day  Protein:  110-125g/day  Fluid:  >1.8L/day  Koleen Distance MS, RD, LDN Pager #- [phone] Office#- [phone] After Hours Pager: [phone]

## 2019-08-04 ENCOUNTER — Encounter: Payer: Self-pay | Admitting: *Deleted

## 2019-08-04 DIAGNOSIS — D61818 Other pancytopenia: Secondary | ICD-10-CM

## 2019-08-04 LAB — COMPREHENSIVE METABOLIC PANEL
ALT: 87 U/L — ABNORMAL HIGH (ref 0–44)
AST: 57 U/L — ABNORMAL HIGH (ref 15–41)
Albumin: 1.5 g/dL — ABNORMAL LOW (ref 3.5–5.0)
Alkaline Phosphatase: 188 U/L — ABNORMAL HIGH (ref 38–126)
Anion gap: 9 (ref 5–15)
BUN: 16 mg/dL (ref 8–23)
CO2: 25 mmol/L (ref 22–32)
Calcium: 9.6 mg/dL (ref 8.9–10.3)
Chloride: 105 mmol/L (ref 98–111)
Creatinine, Ser: 0.43 mg/dL — ABNORMAL LOW (ref 0.44–1.00)
GFR calc Af Amer: >60 mL/min (ref >60)
GFR calc non Af Amer: >60 mL/min (ref >60)
Glucose, Bld: 213 mg/dL — ABNORMAL HIGH (ref 70–99)
Potassium: 3.2 mmol/L — ABNORMAL LOW (ref 3.5–5.1)
Sodium: 139 mmol/L (ref 135–145)
Total Bilirubin: 11.1 mg/dL — ABNORMAL HIGH (ref 0.3–1.2)
Total Protein: 5.8 g/dL — ABNORMAL LOW (ref 6.5–8.1)

## 2019-08-04 LAB — CBC WITH DIFFERENTIAL/PLATELET
Abs Immature Granulocytes: 0.3 10*3/uL — ABNORMAL HIGH (ref 0.00–0.07)
Basophils Absolute: 0 10*3/uL (ref 0.0–0.1)
Basophils Relative: 0 %
Eosinophils Absolute: 0.1 10*3/uL (ref 0.0–0.5)
Eosinophils Relative: 1 %
HCT: 27.9 % — ABNORMAL LOW (ref 36.0–46.0)
Hemoglobin: 8.8 g/dL — ABNORMAL LOW (ref 12.0–15.0)
Immature Granulocytes: 2 %
Lymphocytes Relative: 5 %
Lymphs Abs: 1 10*3/uL (ref 0.7–4.0)
MCH: 25.8 pg — ABNORMAL LOW (ref 26.0–34.0)
MCHC: 31.5 g/dL (ref 30.0–36.0)
MCV: 81.8 fL (ref 80.0–100.0)
Monocytes Absolute: 0.6 10*3/uL (ref 0.1–1.0)
Monocytes Relative: 3 %
Neutro Abs: 17.3 10*3/uL — ABNORMAL HIGH (ref 1.7–7.7)
Neutrophils Relative %: 89 %
Platelets: 95 10*3/uL — ABNORMAL LOW (ref 150–400)
RBC: 3.41 MIL/uL — ABNORMAL LOW (ref 3.87–5.11)
RDW: 26.5 % — ABNORMAL HIGH (ref 11.5–15.5)
WBC: 19.3 10*3/uL — ABNORMAL HIGH (ref 4.0–10.5)
nRBC: 1 % — ABNORMAL HIGH (ref 0.0–0.2)

## 2019-08-04 LAB — MAGNESIUM: Magnesium: 2 mg/dL (ref 1.7–2.4)

## 2019-08-04 LAB — GLUCOSE, CAPILLARY
Glucose-Capillary: 190 mg/dL — ABNORMAL HIGH (ref 70–99)
Glucose-Capillary: 196 mg/dL — ABNORMAL HIGH (ref 70–99)
Glucose-Capillary: 200 mg/dL — ABNORMAL HIGH (ref 70–99)
Glucose-Capillary: 227 mg/dL — ABNORMAL HIGH (ref 70–99)

## 2019-08-04 MED ORDER — DIPHENHYDRAMINE HCL 25 MG PO CAPS: 25.0000 mg | ORAL_CAPSULE | Freq: Four times a day (QID) | ORAL | Status: DC | PRN

## 2019-08-04 MED ORDER — POTASSIUM CHLORIDE 20 MEQ PO PACK
20.0000 meq | PACK | Freq: Two times a day (BID) | ORAL | Status: DC
  Administered 2019-08-04 – 2019-08-06 (×6): 20 meq via ORAL
  Filled 2019-08-04 (×9): qty 1

## 2019-08-04 NOTE — NC FL2 (Signed)
[place] MEDICAID FL2 LEVEL OF CARE SCREENING TOOL     IDENTIFICATION  Patient Name: Rachel Vasquez Birthdate: [birth year] Sex: female Admission Date (Current Location): 08/01/2019  [facility] and [place] Number:  Herbalist and Address:  [facility],  [place], [place]      Provider Number: [id number]  Attending Physician Name and Address:  Ladell Pier, MD  Relative Name and Phone Number:  Donald Siva [phone]    Current Level of Care: Hospital Recommended Level of Care: [facility] Prior Approval Number:    Date Approved/Denied:   PASRR Number: [id number]  Discharge Plan: SNF    Current Diagnoses: Patient Active Problem List   Diagnosis Date Noted  . Hyperglycemia   . 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 Pancreatic cancer metastasized to liver ([place]) 07/21/2019  . Goals of care, counseling/discussion 07/16/2019  . Pancreatic adenocarcinoma ([place]) 07/10/2019  . Hypokalemia 07/10/2019  . Hypomagnesemia 07/10/2019  . Pancreatitis 07/09/2019  . Microcytic anemia 07/09/2019  . Abdominal pain, epigastric 07/03/2019  . Pancreatic mass 07/03/2019  . GERD (gastroesophageal reflux disease)   . Hiatal hernia   . Liver lesion   . 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 Nausea & vomiting 07/02/2019    Orientation RESPIRATION BLADDER Height & Weight     Self, Time, Situation, Place  Normal Continent Weight: 109.4 kg Height:  5\' 7"  (170.2 cm)  BEHAVIORAL SYMPTOMS/MOOD NEUROLOGICAL BOWEL NUTRITION STATUS      Continent Diet  AMBULATORY STATUS COMMUNICATION OF NEEDS Skin   Extensive Assist Verbally Normal                       Personal Care Assistance Level of Assistance  Bathing, Dressing Bathing Assistance: Limited assistance   Dressing Assistance: Limited assistance     Functional Limitations Info             SPECIAL CARE FACTORS FREQUENCY  PT (By licensed PT), OT (By licensed OT)(Biliary  drain that requires flushing and site care.)     PT Frequency: 5 x weekly OT Frequency: 5 x weekly            Contractures Contractures Info: Not present    Additional Factors Info  Code Status, Allergies Code Status Info: Full Code Allergies Info: None Known           Current Medications (08/04/2019):  This is the current hospital active medication list Current Facility-Administered Medications  Medication Dose Route Frequency Provider Last Rate Last Admin  . Chlorhexidine Gluconate Cloth 2 % PADS 6 each  6 each Topical Daily Ladell Pier, MD   6 each at 08/03/19 [id number]  . diphenhydrAMINE (BENADRYL) capsule 25 mg  25 mg Oral Q6H PRN Truitt Merle, MD      . docusate sodium (COLACE) capsule 100 mg  100 mg Oral BID Truitt Merle, MD   100 mg at 08/03/19 0939  . enoxaparin (LOVENOX) injection 40 mg  40 mg Subcutaneous Q24H Owens Shark, NP   40 mg at 08/03/19 [id number]  . fluconazole (DIFLUCAN) tablet 50 mg  50 mg Oral Daily Owens Shark, NP   50 mg at 08/03/19 [id number]  . insulin aspart (novoLOG) injection 0-15 Units  0-15 Units Subcutaneous TID WC Truitt Merle, MD   3 Units at 08/04/19 [phone]  . lactulose (CHRONULAC) 10 GM/15ML solution 10 g  10 g Oral BID PRN Truitt Merle, MD      .  megestrol (MEGACE) 400 MG/10ML suspension 400 mg  400 mg Oral Daily Truitt Merle, MD   400 mg at 08/03/19 [id number]  . multivitamin with minerals tablet 1 tablet  1 tablet Oral Daily Ladell Pier, MD      . ondansetron [facility]) tablet 8 mg  8 mg Oral Q8H PRN Owens Shark, NP      . oxyCODONE (Oxy IR/ROXICODONE) immediate release tablet 2.5-5 mg  2.5-5 mg Oral Q4H PRN Truitt Merle, MD   5 mg at 08/04/19 0548  . pantoprazole (PROTONIX) EC tablet 40 mg  40 mg Oral Daily Owens Shark, NP   40 mg at 08/03/19 [id number]  . potassium chloride (KLOR-CON) packet 20 mEq  20 mEq Oral BID Maryanna Shape, NP         Discharge Medications: Please see discharge summary for a list of discharge medications.  Relevant Imaging  Results:  Relevant Lab Results:   Additional Information SS# [id number]  Stacy Deshler, Marjie Skiff, RN

## 2019-08-04 NOTE — TOC Initial Note (Signed)
Transition of Care [facility]) - Initial/Assessment Note    Patient Details  Name: Rachel Vasquez MRN: [id number] Date of Birth: 1956-01-30  [facility]  [facility] CM/SW Contact:    Lynnell Catalan, RN Phone Number: 08/04/2019, 11:22 AM  Clinical Narrative:                 This CM spoke with pt at bedside with sister [name] on the phone. Pt and sister requesting SNF placement at this time. Permission given to this CM to fax out FL2 to area facilities. TOC will follow up with bed offers.  Expected Discharge Plan: Skilled Nursing Facility Barriers to Discharge: Continued Medical Work up   Patient Goals and CMS Choice        Expected Discharge Plan and Services Expected Discharge Plan: [place]   Discharge Planning Services: CM Consult   Living arrangements for the past 2 months: Apartment                                      Prior Living Arrangements/Services Living arrangements for the past 2 months: Apartment Lives with:: Siblings                   Activities of Daily Living Home Assistive Devices/Equipment: Environmental consultant (specify type) ADL Screening (condition at time of admission) Patient's cognitive ability adequate to safely complete daily activities?: No Is the patient deaf or have difficulty hearing?: No Does the patient have difficulty seeing, even when wearing glasses/contacts?: No Does the patient have difficulty concentrating, remembering, or making decisions?: No Patient able to express need for assistance with ADLs?: Yes Does the patient have difficulty dressing or bathing?: Yes Independently performs ADLs?: No Communication: Independent Dressing (OT): Needs assistance Grooming: Needs assistance Is this a change from baseline?: Change from baseline, expected to last >3 days Feeding: Needs assistance Is this a change from baseline?: Change from baseline, expected to last >3 days Bathing: Needs assistance Is this a change from  baseline?: Change from baseline, expected to last >3 days Toileting: Needs assistance Is this a change from baseline?: Change from baseline, expected to last >3days In/Out Bed: Needs assistance Is this a change from baseline?: Change from baseline, expected to last >3 days Walks in Home: Needs assistance Is this a change from baseline?: Change from baseline, expected to last >3 days Does the patient have difficulty walking or climbing stairs?: Yes Weakness of Legs: Both Weakness of Arms/Hands: Both  Permission Sought/Granted                  Emotional Assessment              Admission diagnosis:  Pancreatic cancer metastasized to liver ([place]) [C25.9, C78.7] Patient Active Problem List   Diagnosis Date Noted  . Hyperglycemia   . 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 Pancreatic cancer metastasized to liver ([place]) 07/21/2019  . Goals of care, counseling/discussion 07/16/2019  . Pancreatic adenocarcinoma ([place]) 07/10/2019  . Hypokalemia 07/10/2019  . Hypomagnesemia 07/10/2019  . Pancreatitis 07/09/2019  . Microcytic anemia 07/09/2019  . Abdominal pain, epigastric 07/03/2019  . Pancreatic mass 07/03/2019  . GERD (gastroesophageal reflux disease)   . Hiatal hernia   . Liver lesion   . 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 Nausea & vomiting 07/02/2019   PCP:  Patient, [name] Per Pharmacy:   [id number] - [place], [place] -  [place] [id number] [place] [id number] Phone: [phone] Fax: [phone]     Social Determinants of Health (SDOH) Interventions    Readmission Risk Interventions Readmission Risk Prevention Plan 08/04/2019 07/29/2019  Transportation Screening Complete Complete  Medication Review Press photographer) Complete Complete  PCP or Specialist appointment within 3-5 days of discharge Complete Complete  HRI or Home Care Consult Complete Complete  SW Recovery Care/Counseling Consult  Complete Complete  Palliative Care Screening Complete Not [place] Complete Not Applicable  Some recent data might be hidden

## 2019-08-04 NOTE — Progress Notes (Signed)
Referring Physician(s): Dr. Elmore Guise, NP  Supervising Physician: Aletta Edouard  Patient Status:  [facility] - In-pt  Chief Complaint: Leaking biliary drain  Subjective:  Rachel Vasquez is receiving a bed bath during my exam today - she states she feels ok right now. RN at bedside states that drain has been leaking and has saturated the chuck underneath her this morning. Her sister is also present who states that the drain has been leaking since she was last seen in IR on 1/25 and she is concerned something is wrong with it. She also notes some "dirt looking" material in the gravity bag at times.   Allergies: Patient has no known allergies.  Medications: Prior to Admission medications   Medication Sig Start Date End Date Taking? Authorizing Provider  amLODipine (NORVASC) 10 MG tablet Take 1 tablet (10 mg total) by mouth daily. 07/18/19 09/16/19 Yes Antonieta Pert, MD  lidocaine-prilocaine (EMLA) cream Apply 1 application topically as needed. Apply 1 hour prior to stick and cover with plastic wrap 07/16/19  Yes Ladell Pier, MD  LORazepam (ATIVAN) 0.5 MG tablet Take 1 tablet (0.5 mg total) by mouth every 6 (six) hours as needed for up to 15 doses (nausea/vomiting. May give PO or SL.). 07/17/19  Yes Kc, Maren Beach, MD  metoCLOPramide (REGLAN) 5 MG tablet Take 1 tablet (5 mg total) by mouth 3 (three) times daily before meals. 07/17/19 08/16/19 Yes Antonieta Pert, MD  Multiple Vitamins-Minerals (MULTIVITAMIN WITH MINERALS) tablet Take 1 tablet by mouth daily.   Yes [provider]  ondansetron (ZOFRAN-ODT) 4 MG disintegrating tablet Take 1 tablet (4 mg total) by mouth every 8 (eight) hours as needed for up to 30 doses for nausea or vomiting. 07/17/19  Yes Antonieta Pert, MD  oxyCODONE (OXY IR/ROXICODONE) 5 MG immediate release tablet Take 1 tablet (5 mg total) by mouth every 4 (four) hours as needed for up to 15 doses for moderate pain. 07/17/19  Yes Antonieta Pert, MD  oxyCODONE (OXYCONTIN) 10 mg 12  hr tablet Take 1 tablet (10 mg total) by mouth at bedtime for 7 doses. 07/29/19 08/05/19 Yes Curcio, Roselie Awkward, NP  oxyCODONE (OXYCONTIN) 20 mg 12 hr tablet Take 1 tablet (20 mg total) by mouth every morning for 7 doses. 07/29/19 08/05/19 Yes Curcio, Roselie Awkward, NP  pantoprazole (PROTONIX) 40 MG tablet Take 1 tablet (40 mg total) by mouth daily. 07/17/19 08/16/19 Yes Antonieta Pert, MD  polyethylene glycol (MIRALAX / GLYCOLAX) 17 g packet Take 17 g by mouth daily as needed for mild constipation. 07/17/19  Yes Antonieta Pert, MD  predniSONE (DELTASONE) 10 MG tablet Take 1 tablet (10 mg total) by mouth daily with breakfast. 07/18/19 08/17/19 Yes Kc, Maren Beach, MD  prochlorperazine (COMPAZINE) 10 MG tablet Take 1 tablet (10 mg total) by mouth every 6 (six) hours as needed for nausea. 07/16/19  Yes Ladell Pier, MD  loperamide (IMODIUM) 2 MG capsule Take 1 capsule (2 mg total) by mouth as needed for diarrhea or loose stools. 07/29/19   Maryanna Shape, NP     Vital Signs: BP 127/76 (BP Location: Right Arm)   Pulse 99   Temp 97.8 F (36.6 C) (Oral)   Resp 17   Ht 5\' 7"  (1.702 m)   Wt 241 lb 1.6 oz (109.4 kg)   SpO2 94%   BMI 37.76 kg/m   Physical Exam Vitals and nursing note reviewed.  Constitutional:      General: She is not in acute distress.  Appearance: She is obese. She is ill-appearing.  HENT:     Head: Normocephalic.  Cardiovascular:     Rate and Rhythm: Normal rate.  Pulmonary:     Effort: Pulmonary effort is normal.  Abdominal:     Palpations: Abdomen is soft.     Comments: (+) RUQ drain to gravity with dark bilious output, flushes/aspirates easily. No leakage noted with flushing on my exam. Suture and stat lock in tact, small amount of scabbing around insertion site. Minimally tender. There is an area just anterior to the insertion site which feels consistent with a catheter kink - this does not appear to be affecting the position/functionality of the drain and is non tender to touch.    Skin:    General: Skin is warm and dry.     Coloration: Skin is not jaundiced.  Neurological:     Mental Status: She is alert. Mental status is at baseline.     Imaging:  X-ray chest PA and lateral  Result Date: 08/01/2019 CLINICAL DATA:  Shortness of breath. EXAM: CHEST - 2 VIEW COMPARISON:  None. FINDINGS: A right Port-A-Cath terminates in the central SVC. Bibasilar opacities have platelike components. No pneumothorax. The cardiomediastinal silhouette is stable. No other acute abnormalities. IMPRESSION: Bibasilar platelike opacities likely represent atelectasis. Underlying infiltrates not excluded. Electronically Signed   By: Dorise Bullion III M.D   On: 08/01/2019 19:20    Labs:  CBC: Recent Labs    08/01/19 1138 08/02/19 0500 08/03/19 0854 08/04/19 0500  WBC 19.7* 16.1* 16.8* 19.3*  HGB 10.5* 8.7* 8.3* 8.8*  HCT 32.6* 27.5* 26.7* 27.9*  PLT 111* 99* 93* 95*    COAGS: Recent Labs    07/07/19 0050 07/14/19 0621 07/25/19 0731  INR 1.2 1.3* 2.2*    BMP: Recent Labs    08/01/19 1138 08/02/19 0500 08/03/19 0854 08/04/19 0500  NA 137 136 138 139  K 3.5 3.4* 3.0* 3.2*  CL 100 101 103 105  CO2 27 27 26 25   GLUCOSE 251* 270* 196* 213*  BUN 19 26* 18 16  CALCIUM 10.2 9.5 9.3 9.6  CREATININE 0.84 0.89 0.55 0.43*  GFRNONAA >60 >60 >60 >60  GFRAA >60 >60 >60 >60    LIVER FUNCTION TESTS: Recent Labs    08/01/19 1138 08/02/19 0500 08/03/19 0854 08/04/19 0500  BILITOT 13.3* 11.3* 11.1* 11.1*  AST 58* 95* 63* 57*  ALT 98* 108* 92* 87*  ALKPHOS 321* 220* 201* 188*  PROT 6.6 5.6* 5.6* 5.8*  ALBUMIN 1.8* 1.6* 1.5* 1.5*    Assessment and Plan:  64 y/o F with metastatic pancreatic cancer and internal/external biliary drain placed 1/22 and exchanged on 1/25.   Per I/O output 300 cc in last 24H, drain flushes/aspirates easily. Per RN drain has been leaking and has saturated the dressing and pad today - on my exam today I did not note any leakage with  flushing, however this may be positional as patient was laying on her left side during my exam.   Continue PRN dressing changes - may place ostomy bag over insertion site if leakage continues to be an issue. Monitor for skin breakdown and consider wound care consult if there is significant breakdown. Continue TID flushes with 5 cc NS, record output Qshift, call IR with any concerns.  At this time the drain appears to be functioning appropriately given continued significant output, easy flushing and continued improvement in her t.bili - would not recommend any image currently, however if there is  concern about adequate decompression of the biliary tree would recommend CT abd/pelvis with IV contrast to evaluate.  IR will continue to follow - please call with questions or concerns.   Electronically Signed: Joaquim Nam, PA-C 08/04/2019, 1:16 PM   I spent a total of 25 Minutes at the the patient's bedside AND on the patient's hospital floor or unit, greater than 50% of which was counseling/coordinating care for biliary drain follow up.

## 2019-08-04 NOTE — Progress Notes (Addendum)
HEMATOLOGY-ONCOLOGY PROGRESS NOTE  SUBJECTIVE: Resting quietly the time my visit.  She denies abdominal pain.  Had some nausea overnight but denies nausea this morning.  She denies vomiting and diarrhea.  No fever or chills.  Oncology History  Pancreatic adenocarcinoma ([place])  07/10/2019 Initial Diagnosis   Pancreatic adenocarcinoma ([place])   07/21/2019 -  Chemotherapy   The patient had PACLitaxel-protein bound (ABRAXANE) chemo infusion 225 mg, 100 mg/m2 = 225 mg (original dose ), Intravenous,  Once, 0 of 4 cycles Dose modification: 100 mg/m2 (Cycle 1, Reason: Provider Judgment) gemcitabine (GEMZAR) 2,280 mg in sodium chloride 0.9 % 250 mL chemo infusion, 1,000 mg/m2 = 2,280 mg, Intravenous,  Once, 0 of 4 cycles  for chemotherapy treatment.     PHYSICAL EXAMINATION:  Vitals:   08/03/19 2037 08/04/19 0610  BP: 132/72 127/76  Pulse: 95 99  Resp: 18 17  Temp: 97.7 F (36.5 C) 97.8 F (36.6 C)  SpO2: 94% 94%   Filed Weights   08/01/19 1458  Weight: 241 lb 1.6 oz (109.4 kg)    Intake/Output from previous day: 01/31 0701 - 02/01 0700 In: 2451.1 [I.V.:2429.8; IV Piggyback:21.3] Out: 1000 [Urine:700; Drains:300]  GENERAL: Alert, looks comfortable, falls asleep easily EYES: No icterus noted OROPHARYNX: No thrush or mucositis LUNGS: clear to auscultation and percussion with normal breathing effort HEART: regular rate & rhythm, trace pitting edema in the lower extremities ABDOMEN:abdomen soft, non-tender and normal bowel sounds NEURO: alert & oriented x 3 with fluent speech, no focal motor/sensory deficits  Port-A-Cath without erythema  LABORATORY DATA:  I have reviewed the data as listed CMP Latest Ref Rng & Units 08/04/2019 08/03/2019 08/02/2019  Glucose 70 - 99 mg/dL 213(H) 196(H) 270(H)  BUN 8 - 23 mg/dL 16 18 26(H)  Creatinine 0.44 - 1.00 mg/dL 0.43(L) 0.55 0.89  Sodium 135 - 145 mmol/L 139 138 136  Potassium 3.5 - 5.1 mmol/L 3.2(L) 3.0(L) 3.4(L)  Chloride 98 - 111 mmol/L 105  103 101  CO2 22 - 32 mmol/L 25 26 27   Calcium 8.9 - 10.3 mg/dL 9.6 9.3 9.5  Total Protein 6.5 - 8.1 g/dL 5.8(L) 5.6(L) 5.6(L)  Total Bilirubin 0.3 - 1.2 mg/dL 11.1(H) 11.1(H) 11.3(H)  Alkaline Phos 38 - 126 U/L 188(H) 201(H) 220(H)  AST 15 - 41 U/L 57(H) 63(H) 95(H)  ALT 0 - 44 U/L 87(H) 92(H) 108(H)    Lab Results  Component Value Date   WBC 19.3 (H) 08/04/2019   HGB 8.8 (L) 08/04/2019   HCT 27.9 (L) 08/04/2019   MCV 81.8 08/04/2019   PLT 95 (L) 08/04/2019   NEUTROABS 17.3 (H) 08/04/2019    X-ray chest PA and lateral  Result Date: 08/01/2019 CLINICAL DATA:  Shortness of breath. EXAM: CHEST - 2 VIEW COMPARISON:  None. FINDINGS: A right Port-A-Cath terminates in the central SVC. Bibasilar opacities have platelike components. No pneumothorax. The cardiomediastinal silhouette is stable. No other acute abnormalities. IMPRESSION: Bibasilar platelike opacities likely represent atelectasis. Underlying infiltrates not excluded. Electronically Signed   By: Dorise Bullion III M.D   On: 08/01/2019 19:20   CT CHEST W CONTRAST  Result Date: 07/07/2019 CLINICAL DATA:  Liver mass.  Concern for hepatic metastasis EXAM: CT CHEST WITH CONTRAST TECHNIQUE: Multidetector CT imaging of the chest was performed during intravenous contrast administration. CONTRAST:  80mL OMNIPAQUE IOHEXOL 300 MG/ML  SOLN COMPARISON:  CT 07/02/2019 FINDINGS: Cardiovascular: No significant vascular findings. Normal heart size. No pericardial effusion. Mediastinum/Nodes: No axillary supraclavicular adenopathy. Multiple nodules within the thyroid gland  measures 12 mm or less. This is not meet the size requirement for follow-up imaging. No mediastinal lymphadenopathy. Moderate to large hiatal hernia posterior to the LEFT heart. Lungs/Pleura: Linear atelectasis in the lingula and medial RIGHT middle lobe. No suspicious nodularity. Upper Abdomen: Several low-density suspicious lesions in the liver again demonstrated. Inflammation  potential mass in the pancreatic head again noted. No interval change from 07/02/2019. Musculoskeletal: No aggressive osseous lesion. IMPRESSION: 1. No evidence of thoracic metastasis. 2. Low-density lesions in the liver concerning for metastasis. See recent abdomen CT 07/02/2019. 3. Inflammation head of pancreas with concern for underlying neoplasm. Recommend appropriate tumor biomarkers. Electronically Signed   By: Suzy Bouchard M.D.   On: 07/07/2019 08:23   CT ABDOMEN PELVIS W CONTRAST  Result Date: 07/21/2019 CLINICAL DATA:  History of recently diagnosed metastatic pancreatic cancer. EXAM: CT ABDOMEN AND PELVIS WITH CONTRAST TECHNIQUE: Multidetector CT imaging of the abdomen and pelvis was performed using the standard protocol following bolus administration of intravenous contrast. CONTRAST:  171mL OMNIPAQUE IOHEXOL 300 MG/ML  SOLN COMPARISON:  July 02, 2019 FINDINGS: Lower chest: Mild atelectasis is seen within the anterior aspect of the bilateral lung bases. Hepatobiliary: Numerous heterogeneous low-attenuation liver lesions are seen. These are mildly increased in size when compared to the prior study. Ill-defined gallstones are seen within a contracted gallbladder. Pancreas: A 5.6 cm x 6.3 cm x 5.4 cm complex, partially cystic mass is seen involving the body and head of the pancreas Spleen: Normal in size without focal abnormality. Adrenals/Urinary Tract: Adrenal glands are unremarkable. Kidneys are normal, without renal calculi, focal lesion, or hydronephrosis. Bladder is unremarkable. Stomach/Bowel: There is a large gastric hernia. Appendix appears normal. No evidence of bowel wall thickening, distention, or inflammatory changes. Vascular/Lymphatic: Reproductive: A 12.8 cm x 8.6 cm heterogeneous mass is seen extending from the anterolateral aspect of the uterine fundus on the left. This contains soft tissue and fat density components. Additional 6.8 cm x 6.9 cm, 5.0 cm diameter and 3.7 cm  diameter homogeneous uterine mass is seen. Other: Small fat containing ventral hernias are seen along the midline of the lower abdomen. Musculoskeletal: No acute or significant osseous findings. IMPRESSION: 1. 5.6 cm x 6.3 cm x 5.4 cm complex pancreatic mass which is increased in size when compared to the prior study dated July 02, 2019. 2. Numerous heterogeneous liver lesions consistent with hepatic metastasis. 3. Cholelithiasis. 4. Large gastric hernia. 5. Multiple large uterine masses which may represent large uterine fibroids. Electronically Signed   By: Virgina Norfolk M.D.   On: 07/21/2019 21:34   MR 3D Recon At Scanner  Result Date: 07/23/2019 CLINICAL DATA:  Inpatient. Pancreatic cancer with biopsy-proven liver metastases based on 07/07/2019 liver mass biopsy, now with hyperbilirubinemia. Chemotherapy initiated 07/21/2019. EXAM: MRI ABDOMEN WITHOUT AND WITH CONTRAST (INCLUDING MRCP) TECHNIQUE: Multiplanar multisequence MR imaging of the abdomen was performed both before and after the administration of intravenous contrast. Heavily T2-weighted images of the biliary and pancreatic ducts were obtained, and three-dimensional MRCP images were rendered by post processing. CONTRAST:  71mL GADAVIST GADOBUTROL 1 MMOL/ML IV SOLN COMPARISON:  07/21/2019 CT abdomen/pelvis. FINDINGS: Lower chest: No acute abnormality at the lung bases. Hepatobiliary: There are numerous (greater than 15) similar liver masses scattered throughout the liver each demonstrating mild T2 hyperintensity, restricted diffusion and heterogeneous enhancement, compatible with liver metastases. Representative liver masses measure 5.1 x 3.8 cm in the segment 2 left liver lobe (series 3/image 19), 5.3 x 4.3 cm in the segment 8 right  liver lobe (series 3/image 17) and 1.8 x 1.6 cm in the caudate lobe (series 3/image 21). Additional scattered simple liver cysts, largest 2.3 cm in the posterior right liver lobe. No hepatic steatosis. Nondistended  gallbladder contains numerous gallstones, largest 10 mm. No definite gallbladder wall thickening. No significant pericholecystic fluid. Mild-to-moderate diffuse intrahepatic biliary ductal dilatation. Prominent proximal common bile duct dilation up to 18 mm diameter, with abrupt CBD caliber transition at the level of the pancreatic head. No choledocholithiasis. No beading of the intrahepatic bile ducts. Pancreas: Poorly marginated pancreatic head mass measuring approximately 5.3 x 4.9 cm (series 3/image 39) with mixed T1 and T2 signal intensity and thick irregular peripheral enhancement with central necrosis. Dilated main pancreatic duct up to 6 mm diameter. Spleen: Normal size. No mass. Adrenals/Urinary Tract: Normal adrenals. No hydronephrosis. Normal kidneys with no renal mass. Stomach/Bowel: Moderate hiatal hernia. Otherwise normal nondistended stomach. Visualized small and large bowel is normal caliber, with no bowel wall thickening. Vascular/Lymphatic: Normal caliber abdominal aorta. Occluded portal splenic venous confluence with nonocclusive bland appearing thrombosis of the SMV (series 1103/image 60). Nonocclusive bland appearing thrombosis of main portal vein near the bifurcation with near occlusive thrombosis of the right portal vein. Patent left portal vein. Patent renal and hepatic veins and IVC. No pathologically enlarged lymph nodes in the abdomen. Other: No abdominal ascites or focal fluid collection. Musculoskeletal: No aggressive appearing focal osseous lesions. IMPRESSION: 1. Poorly marginated heterogeneously enhancing 5.3 cm pancreatic head mass compatible with known primary pancreatic malignancy. 2. 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 Mild-to-moderate diffuse intrahepatic biliary ductal dilatation. 3. Occluded portosplenic venous confluence. Bland appearing nonocclusive thrombosis of the SMV and of the main  portal vein near the bifurcation. Near-occlusive bland appearing thrombosis of the right portal vein. 4. Widespread liver metastases. 5. Moderate hiatal hernia. Electronically Signed   By: Ilona Sorrel M.D.   On: 07/23/2019 14:09   US BIOPSY (LIVER)  Result Date: 07/07/2019 INDICATION: Concern for metastatic pancreatic cancer. Please perform ultrasound-guided liver lesion biopsy for tissue diagnostic purposes. EXAM: ULTRASOUND GUIDED LIVER LESION BIOPSY COMPARISON:  CT abdomen and pelvis-07/02/2019 MEDICATIONS: None ANESTHESIA/SEDATION: Fentanyl 100 mcg IV; Versed 2 mg IV Total Moderate Sedation time:  17 Minutes. The patient's level of consciousness and vital signs were monitored continuously by radiology nursing throughout the procedure under my direct supervision. COMPLICATIONS: None immediate. PROCEDURE: Informed written consent was obtained from the patient after a discussion of the risks, benefits and alternatives to treatment. The patient understands and consents the procedure. A timeout was performed prior to the initiation of the procedure. Ultrasound scanning was performed of the right upper abdominal quadrant demonstrates an ill-defined approximately 2.9 x 3.1 x 2.2 cm hypoechoic nodule/mass within the dome of the right lobe of the liver correlating with the indeterminate mass seen on preceding abdominal CT image 15, series 3. Note, imaging was degraded secondary to patient body habitus well as well as the lesion being located within the dome of the liver with associated interposition of lung. The procedure was planned. The right upper abdominal quadrant was prepped and draped in the usual sterile fashion. The overlying soft tissues were anesthetized with 1% lidocaine with epinephrine. A 17 gauge, 6.8 cm co-axial needle was advanced into a peripheral aspect of the lesion. This was followed by 6 core biopsies with an 18 gauge core device under direct ultrasound guidance. The coaxial needle tract was  embolized with  a small amount of Gel-Foam slurry and superficial hemostasis was obtained with manual compression. Post procedural scanning was negative for definitive area of hemorrhage or additional complication. A dressing was placed. The patient tolerated the procedure well without immediate post procedural complication. IMPRESSION: Technically challenging though ultimately successful ultrasound guided core needle biopsy of indeterminate mass within the dome of the right lobe of the liver. Electronically Signed   By: Sandi Mariscal M.D.   On: 07/07/2019 13:34   IR REMOVAL TUN ACCESS W/ PORT W/O FL MOD SED  Result Date: 07/28/2019 INDICATION: 64 year old female with nonfunctional port catheter EXAM: REMOVAL OF NONFUNCTIONAL PORT CATHETER. IMPLANTED PORT A CATH PLACEMENT WITH ULTRASOUND AND FLUOROSCOPIC GUIDANCE MEDICATIONS: 2 G ANCEF; The antibiotic was administered within an appropriate time interval prior to skin puncture. ANESTHESIA/SEDATION: Moderate (conscious) sedation was employed during this procedure. A total of Versed 4.0 mg and Fentanyl 150 mcg was administered intravenously. Moderate Sedation Time: 45 minutes. The patient's level of consciousness and vital signs were monitored continuously by radiology nursing throughout the procedure under my direct supervision. FLUOROSCOPY TIME:  One minutes, 24 seconds (73 mGy) COMPLICATIONS: None PROCEDURE: Informed consent was obtained from the patient following an explanation of the procedure, risks, benefits and alternatives. The patient understands, agrees and consents for the procedure. All questions were addressed. A time out was performed prior to the initiation of the procedure. The right neck and chest was prepped with chlorhexidine, and draped in the usual sterile fashion using maximum barrier technique (cap and mask, sterile gown, sterile gloves, large sterile sheet, hand hygiene and cutaneous antiseptic). Antibiotic prophylaxis was provided with 2.0g  Ancef administered IV one hour prior to skin incision. Local anesthesia was attained by infiltration with 1% lidocaine without epinephrine. The patient was positioned in the operation suite on the image intensifier table in the supine position. The right breast was taped to remove redundant tissue and attempt to recreate the position of the breast in an upright position to decrease any redundancy in the port catheter measurement. The previous scar on the right chest was generously infiltrated with 1% lidocaine for local anesthesia. Infiltration of the skin and subcutaneous tissues surrounding the port was performed. Using sharp and blunt dissection, the port apparatus and subcutaneous catheter were removed in their entirety. The port pocket was then closed with interrupted Vicryl layer within the deep tissues, an additional intermediate layer with 2 horizontal mattress sutures using Vicryl, and then a running subcuticular with 4-0 Monocryl. The skin was sealed with Derma bond. A sterile dressing was placed, including Steri-Strips. We then proceeded with placement of new port catheter. Ultrasound survey was performed with images stored and sent to PACs. Ultrasound demonstrated patency of the right internal jugular vein, and this was documented with an image. Under real-time ultrasound guidance, this vein was accessed with a 21 gauge micropuncture needle and image documentation was performed. A small dermatotomy was made at the access site with an 11 scalpel. A 0.018" wire was advanced into the SVC and used to estimate the length of the internal catheter. The access needle exchanged for a 76F micropuncture vascular sheath. The 0.018" wire was then removed and a 0.035" wire advanced into the IVC. An appropriate location for the subcutaneous reservoir was selected below the clavicle and an incision was made through the skin and underlying soft tissues. The subcutaneous tissues were then dissected using a combination of  blunt and sharp surgical technique and a pocket was formed. A single lumen power injectable portacatheter  was then tunneled through the subcutaneous tissues from the pocket to the dermatotomy and the port reservoir placed within the subcutaneous pocket. Prolene suture was then used to anchor the port within the port pocket. The venous access site was then serially dilated and a peel away vascular sheath placed over the wire. The wire was removed and the port catheter advanced into position under fluoroscopic guidance. The catheter tip is positioned at the superior cavoatrial junction. This was documented with a spot image. The portacatheter was then tested and found to flush and aspirate well. The port was flushed with saline followed by 100 units/mL heparinized saline. The pocket was then closed in two layers using first subdermal inverted interrupted absorbable sutures followed by a running subcuticular suture. The epidermis was then sealed with Dermabond. The dermatotomy at the venous access site was also seal with Dermabond. Patient tolerated the procedure well and remained hemodynamically stable throughout. No complications encountered and no significant blood loss encountered IMPRESSION: Status post removal of nonfunctional right IJ port catheter with placement of a new right IJ port catheter. Catheter ready for use. Signed, Dulcy Fanny. Dellia Nims, RPVI [facility] Electronically Signed   By: Corrie Mckusick D.O.   On: 07/28/2019 17:21   DG CHEST PORT 1 VIEW  Result Date: 07/27/2019 CLINICAL DATA:  Port-A-Cath placement. EXAM: PORTABLE CHEST 1 VIEW COMPARISON:  None. FINDINGS: A right-sided venous Port-A-Cath is seen with its distal tip overlying the right apex. This is within the region overlying the proximal portion of the right clavicle. Mild linear atelectasis is seen within the right lung base. There is a small left pleural effusion. No  pneumothorax is identified. The cardiac silhouette is moderately enlarged. The visualized skeletal structures are unremarkable. IMPRESSION: 1. Right-sided venous Port-A-Cath positioning, as described above. 2. Mild right basilar atelectasis. 3. Small left pleural effusion. Electronically Signed   By: Virgina Norfolk M.D.   On: 07/27/2019 22:43   DG Abd 2 Views  Result Date: 07/08/2019 CLINICAL DATA:  Abdominal pain and distension x3 days. EXAM: ABDOMEN - 2 VIEW COMPARISON:  None. FINDINGS: The bowel gas pattern is normal. A moderate to marked amount of stool is seen within the ascending colon. There is no evidence of free air. No radio-opaque calculi or other significant radiographic abnormality is seen. A radiopaque tubal ligation clip is seen within the pelvis on the right. An additional tubal ligation clip is seen overlying the superior aspect of the sacrum on the left. A 4 mm focal opacity is seen overlying the symphysis pubis on the left. IMPRESSION: 1. Normal bowel gas pattern. No evidence of free air. Electronically Signed   By: Virgina Norfolk M.D.   On: 07/08/2019 16:27   DG Abd Portable 1V  Result Date: 07/10/2019 CLINICAL DATA:  Abdominal pain EXAM: PORTABLE ABDOMEN - 1 VIEW COMPARISON:  07/08/2019 FINDINGS: Bowel gas pattern remains unremarkable. Stool burden is overall mild with greater burden within the ascending colon. Tubal ligation clips noted. IMPRESSION: Normal bowel gas pattern. Electronically Signed   By: Macy Mis M.D.   On: 07/10/2019 13:20   DG C-Arm 1-60 Min-No Report  Result Date: 07/24/2019 Fluoroscopy was utilized by the requesting physician.  No radiographic interpretation.   MR ABDOMEN MRCP W WO CONTAST  Result Date: 07/23/2019 CLINICAL DATA:  Inpatient. Pancreatic cancer with biopsy-proven liver metastases based on 07/07/2019 liver mass biopsy, now with hyperbilirubinemia. Chemotherapy initiated 07/21/2019. EXAM: MRI ABDOMEN WITHOUT AND WITH CONTRAST (INCLUDING  MRCP)  TECHNIQUE: Multiplanar multisequence MR imaging of the abdomen was performed both before and after the administration of intravenous contrast. Heavily T2-weighted images of the biliary and pancreatic ducts were obtained, and three-dimensional MRCP images were rendered by post processing. CONTRAST:  10mL GADAVIST GADOBUTROL 1 MMOL/ML IV SOLN COMPARISON:  07/21/2019 CT abdomen/pelvis. FINDINGS: Lower chest: No acute abnormality at the lung bases. Hepatobiliary: There are numerous (greater than 15) similar liver masses scattered throughout the liver each demonstrating mild T2 hyperintensity, restricted diffusion and heterogeneous enhancement, compatible with liver metastases. Representative liver masses measure 5.1 x 3.8 cm in the segment 2 left liver lobe (series 3/image 19), 5.3 x 4.3 cm in the segment 8 right liver lobe (series 3/image 17) and 1.8 x 1.6 cm in the caudate lobe (series 3/image 21). Additional scattered simple liver cysts, largest 2.3 cm in the posterior right liver lobe. No hepatic steatosis. Nondistended gallbladder contains numerous gallstones, largest 10 mm. No definite gallbladder wall thickening. No significant pericholecystic fluid. Mild-to-moderate diffuse intrahepatic biliary ductal dilatation. Prominent proximal common bile duct dilation up to 18 mm diameter, with abrupt CBD caliber transition at the level of the pancreatic head. No choledocholithiasis. No beading of the intrahepatic bile ducts. Pancreas: Poorly marginated pancreatic head mass measuring approximately 5.3 x 4.9 cm (series 3/image 39) with mixed T1 and T2 signal intensity and thick irregular peripheral enhancement with central necrosis. Dilated main pancreatic duct up to 6 mm diameter. Spleen: Normal size. No mass. Adrenals/Urinary Tract: Normal adrenals. No hydronephrosis. Normal kidneys with no renal mass. Stomach/Bowel: Moderate hiatal hernia. Otherwise normal nondistended stomach. Visualized small and large bowel is  normal caliber, with no bowel wall thickening. Vascular/Lymphatic: Normal caliber abdominal aorta. Occluded portal splenic venous confluence with nonocclusive bland appearing thrombosis of the SMV (series 1103/image 60). Nonocclusive bland appearing thrombosis of main portal vein near the bifurcation with near occlusive thrombosis of the right portal vein. Patent left portal vein. Patent renal and hepatic veins and IVC. No pathologically enlarged lymph nodes in the abdomen. Other: No abdominal ascites or focal fluid collection. Musculoskeletal: No aggressive appearing focal osseous lesions. IMPRESSION: 1. Poorly marginated heterogeneously enhancing 5.3 cm pancreatic head mass compatible with known primary pancreatic malignancy. 2. 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 Mild-to-moderate diffuse intrahepatic biliary ductal dilatation. 3. 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 Near-occlusive bland appearing thrombosis of the right portal vein. 4. Widespread liver metastases. 5. Moderate hiatal hernia. Electronically Signed   By: Ilona Sorrel M.D.   On: 07/23/2019 14:09   IR INT EXT BILIARY DRAIN WITH CHOLANGIOGRAM  Result Date: 07/25/2019 INDICATION: Concern for metastatic pancreatic cancer, now with elevated LFTs and failed attempted endoscopic biliary stent placement. As such, request made for attempted placement of an internal/external biliary drainage catheter in hopes of improving the patient's LFTs so she may be a candidate for chemotherapy. Note, preceding cross-sectional imaging is negative for significant intrahepatic biliary ductal dilatation. EXAM: ULTRASOUND AND FLUOROSCOPIC GUIDED PERCUTANEOUS TRANSHEPATIC CHOLANGIOGRAM AND BILIARY TUBE PLACEMENT COMPARISON:  CT abdomen pelvis-07/21/2019; MRCP-07/23/2019 MEDICATIONS:  Zosyn 3.75 g IV; the antibiotic was administered with an appropriate frame prior to initiation of the procedure. CONTRAST:  69mL OMNIPAQUE IOHEXOL 300 MG/ML SOLN - administered into the biliary tree ANESTHESIA/SEDATION: Moderate (conscious) sedation was employed during this procedure. A total of Versed 8 mg and Fentanyl 300 mcg was administered intravenously.  Moderate Sedation Time: 68 minutes. The patient's level of consciousness and vital signs were monitored continuously by radiology nursing throughout the procedure under my direct supervision. FLUOROSCOPY TIME:  22 minutes ([id number] mGy) COMPLICATIONS: None immediate. TECHNIQUE: Informed written consent was obtained from the patient after a discussion of the risks, benefits and alternatives to treatment. Questions regarding the procedure were encouraged and answered. A timeout was performed prior to the initiation of the procedure. The right upper abdominal quadrant was prepped and draped in the usual sterile fashion, and a sterile drape was applied covering the operative field. Maximum barrier sterile technique with sterile gowns and gloves were used for the procedure. A timeout was performed prior to the initiation of the procedure. Ultrasound scanning of the right upper abdominal quadrant was performed to delineate the anatomy and avoid transgression of the gallbladder or the pleural. A spot along the right mid axillary line was marked fluoroscopically inferior to the right costophrenic angle. Sonographic evaluation was negative for significant dilatation of the intrahepatic biliary tree. Initial attempts were made to opacify a nondilated peripheral duct within the right lobe of the liver however despite transient opacification, the duct could not be cannulated. As such, under ultrasound guidance, a 22 gauge needle was advanced towards the central aspect of the biliary tree. Contrast was injected as the needle was slowly retracted ultimately opacifying a central  intrahepatic biliary duct. The duct was cannulated with a Nitrex wire which was advanced to the level of the CBD. Under fluoroscopic guidance, the access needle was exchanged for the inner 3 French catheter of the Accustick set. A limited percutaneous cholangiogram was then performed. Next, a now opacified nondilated duct within the posterior aspect the right lobe of the liver was targeted with a second 22 gauge needle allowing advancement of a Nitrex wire through the cannulated peripheral bile duct to the level of the CBD. The track was dilated with the [place] set. Next, with the use of a 4 French angled glide catheter, a regular glidewire was advanced through the CBD past the distally obstructing lesion to the level of the duodenum. Contrast injection confirmed appropriate positioning. Over an Amplatz wire, the track was dilated ultimately allowing placement of an 8 French percutaneous biliary drainage catheter. Note, neither a 10 or 12 French percutaneous biliary drainage catheter was available at the time of this initial placement. Contrast was injected as several spot radiographic images were obtained in various obliquities. The biliary drainage catheter was connected to a gravity bag and secured in place with a interrupted suture and a Stat Lock device. The patient tolerated the procedure well without immediate postprocedural complication. FINDINGS: Sonographic evaluation was negative for any significant intrahepatic biliary ductal dilatation, as was demonstrated recent cross-sectional imaging. A central aspect of the right biliary tree was ultimately opacified and cannulated allowing access to a nondilated peripheral duct within posteroinferior segment of the right lobe of the liver utilizing a 2 stick technique, ultimately allowing placement of an 8 [place] biliary drainage catheter with end coiled locked within the duodenum. Limited percutaneous cholangiogram demonstrates long segment narrowing/occlusion  involving the mid and peripheral aspect of the CBD. IMPRESSION: Successful placement of an 8 French percutaneous biliary drainage catheter with end coiled and locked within the duodenum. PLAN: - Recommend obtaining daily CMP levels until a nadir of the patient's bilirubin level is obtained. - Note, the standard sized 10 French percutaneous biliary drainage catheter was not available at the of this placement (neither was a 74 [place]) and as  such, an 8 French drainage catheter was placed. As such, this drainage catheter may be upsized at a later date as clinically indicated. - Consideration for definitive internal biliary stent placement may be considered in 4-6 weeks pending the patient's ultimate plan of care. Electronically Signed   By: Sandi Mariscal M.D.   On: 07/25/2019 15:42   IR EXCHANGE BILIARY DRAIN  Result Date: 07/28/2019 INDICATION: 64 year old female with a history of pancreatic carcinoma, biliary obstruction, hyperbilirubinemia. Concern that the prior drain has become occluded. EXAM: EXCHANGE OF EXISTING INTERNAL/EXTERNAL BILIARY DRAIN MEDICATIONS: 2 g Ancef; The antibiotic was administered within an appropriate time frame prior to the initiation of the procedure. ANESTHESIA/SEDATION: Moderate (conscious) sedation was employed during this procedure. A total of Versed 2.0 mg and Fentanyl 50 mcg was administered intravenously. Moderate Sedation Time: 28 minutes. The patient's level of consciousness and vital signs were monitored continuously by radiology nursing throughout the procedure under my direct supervision. FLUOROSCOPY TIME:  Fluoroscopy Time: 0 minutes 36 seconds (29 mGy). COMPLICATIONS: None PROCEDURE: Informed written consent was obtained from the patient after a thorough discussion of the procedural risks, benefits and alternatives. All questions were addressed. Maximal Sterile Barrier Technique was utilized including caps, mask, sterile gowns, sterile gloves, sterile drape, hand hygiene and  skin antiseptic. A timeout was performed prior to the initiation of the procedure. Patient was positioned supine position on the fluoroscopy table. The right upper quadrant was prepped and draped in the usual sterile fashion including the indwelling drain. 1% lidocaine was used for local anesthesia at the skin entry site. Contrast was injected through the indwelling drain, with patent drain into the entry site of the right biliary system. The limb of the drain across the common bile duct appears occluded. Drain was ligated and the suture was ligated. Bentson wire was advanced through the drain into the duodenum and the drain was removed. 38 French straight sheath was advanced into the biliary system with the introducer removed. Images were acquired after gentle contrast injection. This confirmed obstruction remains at the common bile duct. We then used modified Seldinger technique to place a new 12 [place] biliary drain across the obstruction. Loop was formed in the duodenum and final contrast injection confirmed antegrade contrast flow into the duodenum. Drain was sutured in position attached to gravity drainage. Patient tolerated the procedure well and remained hemodynamically stable throughout. No complications were encountered and no significant blood loss IMPRESSION: Status post exchange of internal/external biliary drain with placement of a new 12 French drain. Signed, Dulcy Fanny. Dellia Nims, RPVI [facility] Inter[facility] Radiology [facility] Electronically Signed   By: Corrie Mckusick D.O.   On: 07/28/2019 17:26   IR IMAGING GUIDED PORT INSERTION  Result Date: 07/28/2019 INDICATION: 64 year old female with nonfunctional port catheter EXAM: REMOVAL OF NONFUNCTIONAL PORT CATHETER. IMPLANTED PORT A CATH PLACEMENT WITH ULTRASOUND AND FLUOROSCOPIC GUIDANCE MEDICATIONS: 2 G ANCEF; The antibiotic was administered within an appropriate time interval prior to skin puncture.  ANESTHESIA/SEDATION: Moderate (conscious) sedation was employed during this procedure. A total of Versed 4.0 mg and Fentanyl 150 mcg was administered intravenously. Moderate Sedation Time: 45 minutes. The patient's level of consciousness and vital signs were monitored continuously by radiology nursing throughout the procedure under my direct supervision. FLUOROSCOPY TIME:  One minutes, 24 seconds (73 mGy) COMPLICATIONS: None PROCEDURE: Informed consent was obtained from the patient following an explanation of the procedure, risks, benefits and alternatives. The patient understands, agrees and consents for the procedure. All questions were  addressed. A time out was performed prior to the initiation of the procedure. The right neck and chest was prepped with chlorhexidine, and draped in the usual sterile fashion using maximum barrier technique (cap and mask, sterile gown, sterile gloves, large sterile sheet, hand hygiene and cutaneous antiseptic). Antibiotic prophylaxis was provided with 2.0g Ancef administered IV one hour prior to skin incision. Local anesthesia was attained by infiltration with 1% lidocaine without epinephrine. The patient was positioned in the operation suite on the image intensifier table in the supine position. The right breast was taped to remove redundant tissue and attempt to recreate the position of the breast in an upright position to decrease any redundancy in the port catheter measurement. The previous scar on the right chest was generously infiltrated with 1% lidocaine for local anesthesia. Infiltration of the skin and subcutaneous tissues surrounding the port was performed. Using sharp and blunt dissection, the port apparatus and subcutaneous catheter were removed in their entirety. The port pocket was then closed with interrupted Vicryl layer within the deep tissues, an additional intermediate layer with 2 horizontal mattress sutures using Vicryl, and then a running subcuticular with  4-0 Monocryl. The skin was sealed with Derma bond. A sterile dressing was placed, including Steri-Strips. We then proceeded with placement of new port catheter. Ultrasound survey was performed with images stored and sent to PACs. Ultrasound demonstrated patency of the right internal jugular vein, and this was documented with an image. Under real-time ultrasound guidance, this vein was accessed with a 21 gauge micropuncture needle and image documentation was performed. A small dermatotomy was made at the access site with an 11 scalpel. A 0.018" wire was advanced into the SVC and used to estimate the length of the internal catheter. The access needle exchanged for a 50F micropuncture vascular sheath. The 0.018" wire was then removed and a 0.035" wire advanced into the IVC. An appropriate location for the subcutaneous reservoir was selected below the clavicle and an incision was made through the skin and underlying soft tissues. The subcutaneous tissues were then dissected using a combination of blunt and sharp surgical technique and a pocket was formed. A single lumen power injectable portacatheter was then tunneled through the subcutaneous tissues from the pocket to the dermatotomy and the port reservoir placed within the subcutaneous pocket. Prolene suture was then used to anchor the port within the port pocket. The venous access site was then serially dilated and a peel away vascular sheath placed over the wire. The wire was removed and the port catheter advanced into position under fluoroscopic guidance. The catheter tip is positioned at the superior cavoatrial junction. This was documented with a spot image. The portacatheter was then tested and found to flush and aspirate well. The port was flushed with saline followed by 100 units/mL heparinized saline. The pocket was then closed in two layers using first subdermal inverted interrupted absorbable sutures followed by a running subcuticular suture. The epidermis  was then sealed with Dermabond. The dermatotomy at the venous access site was also seal with Dermabond. Patient tolerated the procedure well and remained hemodynamically stable throughout. No complications encountered and no significant blood loss encountered IMPRESSION: Status post removal of nonfunctional right IJ port catheter with placement of a new right IJ port catheter. Catheter ready for use. Signed, Dulcy Fanny. Dellia Nims, RPVI Vascular and Interventional Radiology Specialists [facility] Electronically Signed   By: Corrie Mckusick D.O.   On: 07/28/2019 17:21   IR IMAGING GUIDED PORT INSERTION  Result Date: 07/14/2019 CLINICAL DATA:  Pancreatic carcinoma, needs poor for planned chemotherapy regimen EXAM: TUNNELED PORT CATHETER PLACEMENT WITH ULTRASOUND AND FLUOROSCOPIC GUIDANCE FLUOROSCOPY TIME:  0.6 minute; 52  uGym2 DAP ANESTHESIA/SEDATION: Intravenous Fentanyl 38mcg and Versed 2mg  were administered as conscious sedation during continuous monitoring of the patient's level of consciousness and physiological / cardiorespiratory status by the radiology RN, with a total moderate sedation time of 21 minutes. TECHNIQUE: The procedure, risks, benefits, and alternatives were explained to the patient. Questions regarding the procedure were encouraged and answered. The patient understands and consents to the procedure. As antibiotic prophylaxis, cefazolin 2 g was ordered pre-procedure and administered intravenously within one hour of incision. Patency of the right IJ vein was confirmed with ultrasound with image documentation. An appropriate skin site was determined. Skin site was marked. Region was prepped using maximum barrier technique including cap and mask, sterile gown, sterile gloves, large sterile sheet, and Chlorhexidine as cutaneous antisepsis. The region was infiltrated locally with 1% lidocaine. Under real-time ultrasound guidance, the right IJ vein was accessed with a 21 gauge micropuncture  needle; the needle tip within the vein was confirmed with ultrasound image documentation. Needle was exchanged over a 018 guidewire for transitional dilator, and vascular measurement was performed. A small incision was made on the right anterior chest wall and a subcutaneous pocket fashioned. The power-injectable port was positioned and its catheter tunneled to the right IJ dermatotomy site. The transitional dilator was exchanged over an Amplatz wire for a peel-away sheath, through which the port catheter, which had been trimmed to the appropriate length, was advanced and positioned under fluoroscopy with its tip at the cavoatrial junction. Spot chest radiograph confirms good catheter position and no pneumothorax. The port was flushed per protocol. The pocket was closed with deep interrupted and subcuticular continuous 3-0 Monocryl sutures. The incisions were covered with Dermabond then covered with a sterile dressing. The patient tolerated the procedure well. COMPLICATIONS: COMPLICATIONS None immediate IMPRESSION: Technically successful right IJ power-injectable port catheter placement. Ready for routine use. Electronically Signed   By: Lucrezia Europe M.D.   On: 07/14/2019 13:39    ASSESSMENT AND PLAN: 1. Metastatic pancreatic adenocarcinoma -CT of the abdomen pelvis on 07/02/2019 showed a 3.4 x 2.2 x 3  cm mass in the head of the pancreas with multiple ill-defined  hypodense liver masses. -CT of the chest on 07/07/2019 showed no evidence of thoracic  metastasis. -Ultrasound guided liver biopsy on 07/07/2019 demonstrated  metastatic pancreatic adenocarcinoma. 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 Chronicity, now with mild microcytic anemia 4. Hypokalemia-continue to replete IV/p.o. 5. Uterine fibroids 6. Abdominal pain secondary to #1 7. Nausea 8.Hyperbilirubinemia, due to a malignant common bile duct stricture MRI/MRCP pancreas head mass, malignant common bile duct stricture at the level of the pancreas head dilatation of the proximal common bile duct intrahepatic biliary ductal dilatation leg appearing nonocclusive thrombus at the SMV and vein.  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  Numerous liver metastases  Ms. Blevins appears stable.  She  has persistent hyperbilirubinemia.  Biliary drain appears to be functioning well.  He continues to have mild hypokalemia.  She has persistent leukocytosis, anemia, and thrombocytopenia.  She remains afebrile without any signs of infection. ?leukemoid reaction.  She has no signs of bleeding.  She been evaluated by PT who has recommended SNF.  The patient is requesting a second opinion at [facility].  1.  Monitor I's and O's. 2.  Transition of care consult placed for consideration of SNF. 3.  I have spoken with IR who will evaluate her biliary drain today.  I have asked them to make recommendations regarding any imaging to evaluate for adequate decompression of the biliary tract. 4.  We will start her on potassium chloride 20 mEq twice a day. 5.  Daily CBC and CMET 6.  She was encouraged to work with PT. 7.  Will speak with [facility] regarding scheduling a second opinion per the patient request. 8.  Goals of care were again discussed with the patient this morning.  She did not engage in the conversation.  Per previous conversations with the patient and her  sister, she wishes to remain a full code and wants to pursue aggressive treatment with chemotherapy.   LOS: 3 days   Mikey Bussing, DNP, AGPCNP-BC, AOCNP 08/04/19 Ms. Huish was interviewed and examined.  She appears more alert compared to hospital admission on 08/01/2019, but she continues to have a poor performance status.  The bilirubin remains elevated.  She is not a candidate for chemotherapy in her current condition.  She will not be a candidate for chemotherapy unless her performance status improves.  We will discuss options for optimizing the bile duct decompression with interventional radiology.  I discussed hospice care with Ms. Halfhill and her sister.  She does not wish to enroll in hospice at present.  We will try to arrange for a telehealth second opinion visit with [facility].    I encouraged her

## 2019-08-04 NOTE — Progress Notes (Signed)
Called Tedra Coupe at [facility] to request the following scans be pushed over to [facility] at Dr. Gearldine Shown request: CT chest 1/14 CT abd/pelvis 1/18 MRI abd 1/20

## 2019-08-05 LAB — COMPREHENSIVE METABOLIC PANEL
ALT: 85 U/L — ABNORMAL HIGH (ref 0–44)
AST: 58 U/L — ABNORMAL HIGH (ref 15–41)
Albumin: 1.4 g/dL — ABNORMAL LOW (ref 3.5–5.0)
Alkaline Phosphatase: 172 U/L — ABNORMAL HIGH (ref 38–126)
Anion gap: 7 (ref 5–15)
BUN: 16 mg/dL (ref 8–23)
CO2: 25 mmol/L (ref 22–32)
Calcium: 9.9 mg/dL (ref 8.9–10.3)
Chloride: 106 mmol/L (ref 98–111)
Creatinine, Ser: 0.56 mg/dL (ref 0.44–1.00)
GFR calc Af Amer: >60 mL/min (ref >60)
GFR calc non Af Amer: >60 mL/min (ref >60)
Glucose, Bld: 258 mg/dL — ABNORMAL HIGH (ref 70–99)
Potassium: 3.4 mmol/L — ABNORMAL LOW (ref 3.5–5.1)
Sodium: 138 mmol/L (ref 135–145)
Total Bilirubin: 10.7 mg/dL — ABNORMAL HIGH (ref 0.3–1.2)
Total Protein: 5.6 g/dL — ABNORMAL LOW (ref 6.5–8.1)

## 2019-08-05 LAB — CBC WITH DIFFERENTIAL/PLATELET
Abs Immature Granulocytes: 0.34 10*3/uL — ABNORMAL HIGH (ref 0.00–0.07)
Basophils Absolute: 0 10*3/uL (ref 0.0–0.1)
Basophils Relative: 0 %
Eosinophils Absolute: 0.1 10*3/uL (ref 0.0–0.5)
Eosinophils Relative: 0 %
HCT: 27.2 % — ABNORMAL LOW (ref 36.0–46.0)
Hemoglobin: 8.5 g/dL — ABNORMAL LOW (ref 12.0–15.0)
Immature Granulocytes: 2 %
Lymphocytes Relative: 5 %
Lymphs Abs: 1.1 10*3/uL (ref 0.7–4.0)
MCH: 25.7 pg — ABNORMAL LOW (ref 26.0–34.0)
MCHC: 31.3 g/dL (ref 30.0–36.0)
MCV: 82.2 fL (ref 80.0–100.0)
Monocytes Absolute: 0.7 10*3/uL (ref 0.1–1.0)
Monocytes Relative: 3 %
Neutro Abs: 18.6 10*3/uL — ABNORMAL HIGH (ref 1.7–7.7)
Neutrophils Relative %: 90 %
Platelets: 93 10*3/uL — ABNORMAL LOW (ref 150–400)
RBC: 3.31 MIL/uL — ABNORMAL LOW (ref 3.87–5.11)
RDW: 26.2 % — ABNORMAL HIGH (ref 11.5–15.5)
WBC: 20.9 10*3/uL — ABNORMAL HIGH (ref 4.0–10.5)
nRBC: 1.1 % — ABNORMAL HIGH (ref 0.0–0.2)

## 2019-08-05 LAB — GLUCOSE, CAPILLARY
Glucose-Capillary: 259 mg/dL — ABNORMAL HIGH (ref 70–99)
Glucose-Capillary: 168 mg/dL — ABNORMAL HIGH (ref 70–99)
Glucose-Capillary: 187 mg/dL — ABNORMAL HIGH (ref 70–99)
Glucose-Capillary: 167 mg/dL — ABNORMAL HIGH (ref 70–99)

## 2019-08-05 NOTE — Progress Notes (Signed)
Physical Therapy Treatment Patient Details Name: Rachel Vasquez MRN: [id number] DOB: 1956-06-04 Today's Date: 08/05/2019    History of Present Illness 64 year old woman with recently diagnosed metastatic pancreas cancer. Recent hospitalization with biliary obstruction 07/21/19-07/29/19, s/p ERCP 07/24/19. Now readmitted for lethargy, dyspnea, weakness.    PT Comments    Pt already OOB in recliner.General Comments: AxO x 4 but not very engaging.  Answers in a mumbled low tone one to 2 words.  Very little eye contact.  Feeling "tired".  Assisted with amb but a VERY limited distance of 5 feet.   General Gait Details: very short shuffled steps with heavy lean on EVA walker.  Max c/o LE edema/"heaviness" and overall "weakness"  Then pt requested to use bathroom.  Assisted to [facility] stand pivot no AD with much assist as pt grew more fatigued.  General transfer comment: required increased time and MAX encouragement to participate.  Assisted out of recliner twice oncwe to amb and another to transfer to [facility].  Limited activity and very deconditioned.  Required extended rest breaks between. Finally assisted back to recliner and positioned to comfort.   VERY DECONDITIONED/WEAK.  Removed 1325 cc bloody drainage from her Bilary drain which expelled a lot when she she was walking. Pt will need ST Rehab at SNF before D/C back home.     Follow Up Recommendations  SNF(sister and pt agree to SNF due to current mobility status)     Equipment Recommendations       Recommendations for Other Services       Precautions / Restrictions Precautions Precautions: Fall Precaution Comments: biliary drain R side Restrictions Weight Bearing Restrictions: No    Mobility  Bed Mobility               General bed mobility comments: Pt OOB in recliner  Transfers Overall transfer level: Needs assistance Equipment used: None Transfers: Sit to/from [facility] Sit to Stand: Min assist Stand pivot  transfers: Min assist;Mod assist       General transfer comment: required increased time and MAX encouragement to participate.  Assisted out of recliner twice oncwe to amb and another to transfer to [facility].  Limited activity and very deconditioned.  Required extended rest breaks between.  Ambulation/Gait Ambulation/Gait assistance: Min assist;Mod assist Gait Distance (Feet): 5 Feet Assistive device: Bilateral platform walker(EVA walker) Gait Pattern/deviations: Step-through pattern;Decreased step length - right;Decreased step length - left;Shuffle;Trunk flexed Gait velocity: Decreased   General Gait Details: very short shuffled steps with heavy lean on EVA walker.  Max c/o LE edema/"heaviness" and overall "weakness"   Stairs             Wheelchair Mobility    Modified Rankin (Stroke Patients Only)       Balance                                            Cognition Arousal/Alertness: Awake/alert Behavior During Therapy: Flat affect Overall Cognitive Status: Within Functional Limits for tasks assessed                                 General Comments: AxO x 4 but not very engaging.      Exercises      General Comments        Pertinent Vitals/Pain Pain Assessment: 0-10 Pain  Score: 5  Pain Location: at drain site Pain Descriptors / Indicators: Discomfort;Sore Pain Intervention(s): Monitored during session;Patient requesting pain meds-RN notified    Home Living                      Prior Function            PT Goals (current goals can now be found in the care plan section) Progress towards PT goals: Progressing toward goals    Frequency    Min 2X/week      PT Plan Current plan remains appropriate    Co-evaluation              AM-PAC PT "6 Clicks" Mobility   Outcome Measure  Help needed turning from your back to your side while in a flat bed without using bedrails?: A Lot Help needed moving from  lying on your back to sitting on the side of a flat bed without using bedrails?: A Lot Help needed moving to and from a bed to a chair (including a wheelchair)?: A Lot Help needed standing up from a chair using your arms (e.g., wheelchair or bedside chair)?: A Lot Help needed to walk in hospital room?: A Lot Help needed climbing 3-5 steps with a railing? : Total 6 Click Score: 11    End of Session Equipment Utilized During Treatment: Gait belt Activity Tolerance: Patient limited by fatigue;Other (comment)(medical) Patient left: with call bell/phone within reach;with bed alarm set;with family/visitor present;in chair Nurse Communication: Mobility status PT Visit Diagnosis: Muscle weakness (generalized) (M62.81);Difficulty in walking, not elsewhere classified (R26.2)     Time: [id number] PT Time Calculation (min) (ACUTE ONLY): 46 min  Charges:  $Gait Training: 8-22 mins $Therapeutic Activity: 23-37 mins                     Rica Koyanagi  PTA Acute  Rehabilitation Services Pager      [phone] Office      [phone]

## 2019-08-05 NOTE — Progress Notes (Signed)
Referring Physician(s): Dr. Benay Spice / Mikey Bussing, NP  Supervising Physician: Sandi Mariscal  Patient Status:  [facility] - In-pt  Chief Complaint: Pancreatic Cancer, S/P biliary drain placement    Subjective: Patient is sitting in chair during today's exam. She reports RUQ tenderness that has not changed since biliary drain have been placed. Pt reports drain leaked yesterday AM, but did not leak last night. She reports nurse has been flushing the drain. Today, patient denies any fever, chills, chest pain nausea or vomiting. She reports some SOB, but states it is her normal. Patient's SpO2 is 92% on RA.  Patient also states she is having diarrhea, but denies any bloody or dark stools. Vitals are WNL and patient has no new imaging today. LFTs are still elevated but trending down. Total bilirubin is 10.7 and trending down. WBC is elevated (20).   Allergies: Patient has no known allergies.  Medications: Prior to Admission medications   Medication Sig Start Date End Date Taking? Authorizing Provider  amLODipine (NORVASC) 10 MG tablet Take 1 tablet (10 mg total) by mouth daily. 07/18/19 09/16/19 Yes Antonieta Pert, MD  lidocaine-prilocaine (EMLA) cream Apply 1 application topically as needed. Apply 1 hour prior to stick and cover with plastic wrap 07/16/19  Yes Ladell Pier, MD  LORazepam (ATIVAN) 0.5 MG tablet Take 1 tablet (0.5 mg total) by mouth every 6 (six) hours as needed for up to 15 doses (nausea/vomiting. May give PO or SL.). 07/17/19  Yes Kc, Maren Beach, MD  metoCLOPramide (REGLAN) 5 MG tablet Take 1 tablet (5 mg total) by mouth 3 (three) times daily before meals. 07/17/19 08/16/19 Yes Antonieta Pert, MD  Multiple Vitamins-Minerals (MULTIVITAMIN WITH MINERALS) tablet Take 1 tablet by mouth daily.   Yes [provider]  ondansetron (ZOFRAN-ODT) 4 MG disintegrating tablet Take 1 tablet (4 mg total) by mouth every 8 (eight) hours as needed for up to 30 doses for nausea or vomiting. 07/17/19  Yes  Antonieta Pert, MD  oxyCODONE (OXY IR/ROXICODONE) 5 MG immediate release tablet Take 1 tablet (5 mg total) by mouth every 4 (four) hours as needed for up to 15 doses for moderate pain. 07/17/19  Yes Antonieta Pert, MD  oxyCODONE (OXYCONTIN) 10 mg 12 hr tablet Take 1 tablet (10 mg total) by mouth at bedtime for 7 doses. 07/29/19 08/05/19 Yes Curcio, Roselie Awkward, NP  oxyCODONE (OXYCONTIN) 20 mg 12 hr tablet Take 1 tablet (20 mg total) by mouth every morning for 7 doses. 07/29/19 08/05/19 Yes Curcio, Roselie Awkward, NP  pantoprazole (PROTONIX) 40 MG tablet Take 1 tablet (40 mg total) by mouth daily. 07/17/19 08/16/19 Yes Antonieta Pert, MD  polyethylene glycol (MIRALAX / GLYCOLAX) 17 g packet Take 17 g by mouth daily as needed for mild constipation. 07/17/19  Yes Antonieta Pert, MD  predniSONE (DELTASONE) 10 MG tablet Take 1 tablet (10 mg total) by mouth daily with breakfast. 07/18/19 08/17/19 Yes Kc, Maren Beach, MD  prochlorperazine (COMPAZINE) 10 MG tablet Take 1 tablet (10 mg total) by mouth every 6 (six) hours as needed for nausea. 07/16/19  Yes Ladell Pier, MD  loperamide (IMODIUM) 2 MG capsule Take 1 capsule (2 mg total) by mouth as needed for diarrhea or loose stools. 07/29/19   Maryanna Shape, NP     Vital Signs: BP 133/74 (BP Location: Right Arm)   Pulse 98   Temp 98.2 F (36.8 C) (Oral)   Resp 18   Ht 5\' 7"  (1.702 m)   Wt 241 lb  1.6 oz (109.4 kg)   SpO2 92%   BMI 37.76 kg/m   Physical Exam Cardiovascular:     Rate and Rhythm: Regular rhythm. Tachycardia present.     Heart sounds: Normal heart sounds.  Pulmonary:     Effort: Pulmonary effort is normal.     Breath sounds: Normal breath sounds.  Abdominal:     General: Bowel sounds are normal.     Palpations: Abdomen is soft.     Tenderness: There is abdominal tenderness (RUQ).     Comments: Yellow drainage on bandages over site. Approximately 200 cc of yellow-brown drainage in bag.      Imaging: X-ray chest PA and lateral  Result Date: 08/01/2019  CLINICAL DATA:  Shortness of breath. EXAM: CHEST - 2 VIEW COMPARISON:  None. FINDINGS: A right Port-A-Cath terminates in the central SVC. Bibasilar opacities have platelike components. No pneumothorax. The cardiomediastinal silhouette is stable. No other acute abnormalities. IMPRESSION: Bibasilar platelike opacities likely represent atelectasis. Underlying infiltrates not excluded. Electronically Signed   By: Dorise Bullion III M.D   On: 08/01/2019 19:20    Labs:  CBC: Recent Labs    08/02/19 0500 08/03/19 0854 08/04/19 0500 08/05/19 0535  WBC 16.1* 16.8* 19.3* 20.9*  HGB 8.7* 8.3* 8.8* 8.5*  HCT 27.5* 26.7* 27.9* 27.2*  PLT 99* 93* 95* 93*    COAGS: Recent Labs    07/07/19 0050 07/14/19 0621 07/25/19 0731  INR 1.2 1.3* 2.2*    BMP: Recent Labs    08/02/19 0500 08/03/19 0854 08/04/19 0500 08/05/19 0535  NA 136 138 139 138  K 3.4* 3.0* 3.2* 3.4*  CL 101 103 105 106  CO2 27 26 25 25   GLUCOSE 270* 196* 213* 258*  BUN 26* 18 16 16   CALCIUM 9.5 9.3 9.6 9.9  CREATININE 0.89 0.55 0.43* 0.56  GFRNONAA >60 >60 >60 >60  GFRAA >60 >60 >60 >60    LIVER FUNCTION TESTS: Recent Labs    08/02/19 0500 08/03/19 0854 08/04/19 0500 08/05/19 0535  BILITOT 11.3* 11.1* 11.1* 10.7*  AST 95* 63* 57* 58*  ALT 108* 92* 87* 85*  ALKPHOS 220* 201* 188* 172*  PROT 5.6* 5.6* 5.8* 5.6*  ALBUMIN 1.6* 1.5* 1.5* 1.4*    Assessment and Plan:  Biliary Obstruction due to Pancreatic Cancer - internal/external biliary drain on 1/22, with exchange on 1/25  Follow patient closely. LFTs and bilirubin slowly improving. Monitor WBC as it is elevated to 20.9 today and has been trending upward for the past few days. Continue routine drain flushes and dressing changes, as well as monitor drain output. Consider imaging if patient condition does not improve or drainage output is not adequate.   Electronically Signed: D. Rowe Robert, PA-C/ Donnamarie Rossetti, PA-S 08/05/2019, 12:08 PM   I spent a  total of 15 Minutes at the the patient's bedside AND on the patient's hospital floor or unit, greater than 50% of which was counseling/coordinating care for biliary drain    Patient ID: Rachel Vasquez, female   DOB: April 27, 1956, 64 y.o.   MRN: [id number]

## 2019-08-05 NOTE — Progress Notes (Addendum)
HEMATOLOGY-ONCOLOGY PROGRESS NOTE  SUBJECTIVE: Reports some right upper quadrant abdominal pain this morning.  Denies nausea and vomiting.  Reports that she has been drinking boost.  She is trying to work with PT.  Oncology History  Pancreatic adenocarcinoma ([place])  07/10/2019 Initial Diagnosis   Pancreatic adenocarcinoma ([place])   07/21/2019 -  Chemotherapy   The patient had PACLitaxel-protein bound (ABRAXANE) chemo infusion 225 mg, 100 mg/m2 = 225 mg (original dose ), Intravenous,  Once, 0 of 4 cycles Dose modification: 100 mg/m2 (Cycle 1, Reason: Provider Judgment) gemcitabine (GEMZAR) 2,280 mg in sodium chloride 0.9 % 250 mL chemo infusion, 1,000 mg/m2 = 2,280 mg, Intravenous,  Once, 0 of 4 cycles  for chemotherapy treatment.     PHYSICAL EXAMINATION:  Vitals:   08/04/19 2051 08/05/19 0649  BP: 118/73 133/74  Pulse: 98 98  Resp: (!) 22 18  Temp: 97.9 F (36.6 C) 98.2 F (36.8 C)  SpO2: 94% 92%   Filed Weights   08/01/19 1458  Weight: 241 lb 1.6 oz (109.4 kg)    Intake/Output from previous day: 02/01 0701 - 02/02 0700 In: 150  Out: 500 [Urine:350; Drains:150]  GENERAL: Alert, looks comfortable EYES: Scleral icterus noted OROPHARYNX: No thrush or mucositis LUNGS: clear to auscultation and percussion with normal breathing effort HEART: regular rate & rhythm, trace pitting edema in the lower extremities ABDOMEN:abdomen soft, non-tender and normal bowel sounds NEURO: alert & oriented x 3 with fluent speech, no focal motor/sensory deficits  Port-A-Cath without erythema  LABORATORY DATA:  I have reviewed the data as listed CMP Latest Ref Rng & Units 08/05/2019 08/04/2019 08/03/2019  Glucose 70 - 99 mg/dL 258(H) 213(H) 196(H)  BUN 8 - 23 mg/dL 16 16 18   Creatinine 0.44 - 1.00 mg/dL 0.56 0.43(L) 0.55  Sodium 135 - 145 mmol/L 138 139 138  Potassium 3.5 - 5.1 mmol/L 3.4(L) 3.2(L) 3.0(L)  Chloride 98 - 111 mmol/L 106 105 103  CO2 22 - 32 mmol/L 25 25 26   Calcium 8.9 - 10.3  mg/dL 9.9 9.6 9.3  Total Protein 6.5 - 8.1 g/dL 5.6(L) 5.8(L) 5.6(L)  Total Bilirubin 0.3 - 1.2 mg/dL 10.7(H) 11.1(H) 11.1(H)  Alkaline Phos 38 - 126 U/L 172(H) 188(H) 201(H)  AST 15 - 41 U/L 58(H) 57(H) 63(H)  ALT 0 - 44 U/L 85(H) 87(H) 92(H)    Lab Results  Component Value Date   WBC 20.9 (H) 08/05/2019   HGB 8.5 (L) 08/05/2019   HCT 27.2 (L) 08/05/2019   MCV 82.2 08/05/2019   PLT 93 (L) 08/05/2019   NEUTROABS 18.6 (H) 08/05/2019    X-ray chest PA and lateral  Result Date: 08/01/2019 CLINICAL DATA:  Shortness of breath. EXAM: CHEST - 2 VIEW COMPARISON:  None. FINDINGS: A right Port-A-Cath terminates in the central SVC. Bibasilar opacities have platelike components. No pneumothorax. The cardiomediastinal silhouette is stable. No other acute abnormalities. IMPRESSION: Bibasilar platelike opacities likely represent atelectasis. Underlying infiltrates not excluded. Electronically Signed   By: Dorise Bullion III M.D   On: 08/01/2019 19:20   CT CHEST W CONTRAST  Result Date: 07/07/2019 CLINICAL DATA:  Liver mass.  Concern for hepatic metastasis EXAM: CT CHEST WITH CONTRAST TECHNIQUE: Multidetector CT imaging of the chest was performed during intravenous contrast administration. CONTRAST:  38mL OMNIPAQUE IOHEXOL 300 MG/ML  SOLN COMPARISON:  CT 07/02/2019 FINDINGS: Cardiovascular: No significant vascular findings. Normal heart size. No pericardial effusion. Mediastinum/Nodes: No axillary supraclavicular adenopathy. Multiple nodules within the thyroid gland measures 12 mm or less. This  is not meet the size requirement for follow-up imaging. No mediastinal lymphadenopathy. Moderate to large hiatal hernia posterior to the LEFT heart. Lungs/Pleura: Linear atelectasis in the lingula and medial RIGHT middle lobe. No suspicious nodularity. Upper Abdomen: Several low-density suspicious lesions in the liver again demonstrated. Inflammation potential mass in the pancreatic head again noted. No interval  change from 07/02/2019. Musculoskeletal: No aggressive osseous lesion. IMPRESSION: 1. No evidence of thoracic metastasis. 2. Low-density lesions in the liver concerning for metastasis. See recent abdomen CT 07/02/2019. 3. Inflammation head of pancreas with concern for underlying neoplasm. Recommend appropriate tumor biomarkers. Electronically Signed   By: Suzy Bouchard M.D.   On: 07/07/2019 08:23   CT ABDOMEN PELVIS W CONTRAST  Result Date: 07/21/2019 CLINICAL DATA:  History of recently diagnosed metastatic pancreatic cancer. EXAM: CT ABDOMEN AND PELVIS WITH CONTRAST TECHNIQUE: Multidetector CT imaging of the abdomen and pelvis was performed using the standard protocol following bolus administration of intravenous contrast. CONTRAST:  132mL OMNIPAQUE IOHEXOL 300 MG/ML  SOLN COMPARISON:  July 02, 2019 FINDINGS: Lower chest: Mild atelectasis is seen within the anterior aspect of the bilateral lung bases. Hepatobiliary: Numerous heterogeneous low-attenuation liver lesions are seen. These are mildly increased in size when compared to the prior study. Ill-defined gallstones are seen within a contracted gallbladder. Pancreas: A 5.6 cm x 6.3 cm x 5.4 cm complex, partially cystic mass is seen involving the body and head of the pancreas Spleen: Normal in size without focal abnormality. Adrenals/Urinary Tract: Adrenal glands are unremarkable. Kidneys are normal, without renal calculi, focal lesion, or hydronephrosis. Bladder is unremarkable. Stomach/Bowel: There is a large gastric hernia. Appendix appears normal. No evidence of bowel wall thickening, distention, or inflammatory changes. Vascular/Lymphatic: Reproductive: A 12.8 cm x 8.6 cm heterogeneous mass is seen extending from the anterolateral aspect of the uterine fundus on the left. This contains soft tissue and fat density components. Additional 6.8 cm x 6.9 cm, 5.0 cm diameter and 3.7 cm diameter homogeneous uterine mass is seen. Other: Small fat containing  ventral hernias are seen along the midline of the lower abdomen. Musculoskeletal: No acute or significant osseous findings. IMPRESSION: 1. 5.6 cm x 6.3 cm x 5.4 cm complex pancreatic mass which is increased in size when compared to the prior study dated July 02, 2019. 2. Numerous heterogeneous liver lesions consistent with hepatic metastasis. 3. Cholelithiasis. 4. Large gastric hernia. 5. Multiple large uterine masses which may represent large uterine fibroids. Electronically Signed   By: Virgina Norfolk M.D.   On: 07/21/2019 21:34   MR 3D Recon At Scanner  Result Date: 07/23/2019 CLINICAL DATA:  Inpatient. Pancreatic cancer with biopsy-proven liver metastases based on 07/07/2019 liver mass biopsy, now with hyperbilirubinemia. Chemotherapy initiated 07/21/2019. EXAM: MRI ABDOMEN WITHOUT AND WITH CONTRAST (INCLUDING MRCP) TECHNIQUE: Multiplanar multisequence MR imaging of the abdomen was performed both before and after the administration of intravenous contrast. Heavily T2-weighted images of the biliary and pancreatic ducts were obtained, and three-dimensional MRCP images were rendered by post processing. CONTRAST:  64mL GADAVIST GADOBUTROL 1 MMOL/ML IV SOLN COMPARISON:  07/21/2019 CT abdomen/pelvis. FINDINGS: Lower chest: No acute abnormality at the lung bases. Hepatobiliary: There are numerous (greater than 15) similar liver masses scattered throughout the liver each demonstrating mild T2 hyperintensity, restricted diffusion and heterogeneous enhancement, compatible with liver metastases. Representative liver masses measure 5.1 x 3.8 cm in the segment 2 left liver lobe (series 3/image 19), 5.3 x 4.3 cm in the segment 8 right liver lobe (series 3/image 17) and  1.8 x 1.6 cm in the caudate lobe (series 3/image 21). Additional scattered simple liver cysts, largest 2.3 cm in the posterior right liver lobe. No hepatic steatosis. Nondistended gallbladder contains numerous gallstones, largest 10 mm. No definite  gallbladder wall thickening. No significant pericholecystic fluid. Mild-to-moderate diffuse intrahepatic biliary ductal dilatation. Prominent proximal common bile duct dilation up to 18 mm diameter, with abrupt CBD caliber transition at the level of the pancreatic head. No choledocholithiasis. No beading of the intrahepatic bile ducts. Pancreas: Poorly marginated pancreatic head mass measuring approximately 5.3 x 4.9 cm (series 3/image 39) with mixed T1 and T2 signal intensity and thick irregular peripheral enhancement with central necrosis. Dilated main pancreatic duct up to 6 mm diameter. Spleen: Normal size. No mass. Adrenals/Urinary Tract: Normal adrenals. No hydronephrosis. Normal kidneys with no renal mass. Stomach/Bowel: Moderate hiatal hernia. Otherwise normal nondistended stomach. Visualized small and large bowel is normal caliber, with no bowel wall thickening. Vascular/Lymphatic: Normal caliber abdominal aorta. Occluded portal splenic venous confluence with nonocclusive bland appearing thrombosis of the SMV (series 1103/image 60). Nonocclusive bland appearing thrombosis of main portal vein near the bifurcation with near occlusive thrombosis of the right portal vein. Patent left portal vein. Patent renal and hepatic veins and IVC. No pathologically enlarged lymph nodes in the abdomen. Other: No abdominal ascites or focal fluid collection. Musculoskeletal: No aggressive appearing focal osseous lesions. IMPRESSION: 1. Poorly marginated heterogeneously enhancing 5.3 cm pancreatic head mass compatible with known primary pancreatic malignancy. 2. 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 Mild-to-moderate diffuse intrahepatic biliary ductal dilatation. 3. 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 Near-occlusive bland appearing  thrombosis of the right portal vein. 4. Widespread liver metastases. 5. Moderate hiatal hernia. Electronically Signed   By: Ilona Sorrel M.D.   On: 07/23/2019 14:09   US BIOPSY (LIVER)  Result Date: 07/07/2019 INDICATION: Concern for metastatic pancreatic cancer. Please perform ultrasound-guided liver lesion biopsy for tissue diagnostic purposes. EXAM: ULTRASOUND GUIDED LIVER LESION BIOPSY COMPARISON:  CT abdomen and pelvis-07/02/2019 MEDICATIONS: None ANESTHESIA/SEDATION: Fentanyl 100 mcg IV; Versed 2 mg IV Total Moderate Sedation time:  17 Minutes. The patient's level of consciousness and vital signs were monitored continuously by radiology nursing throughout the procedure under my direct supervision. COMPLICATIONS: None immediate. PROCEDURE: Informed written consent was obtained from the patient after a discussion of the risks, benefits and alternatives to treatment. The patient understands and consents the procedure. A timeout was performed prior to the initiation of the procedure. Ultrasound scanning was performed of the right upper abdominal quadrant demonstrates an ill-defined approximately 2.9 x 3.1 x 2.2 cm hypoechoic nodule/mass within the dome of the right lobe of the liver correlating with the indeterminate mass seen on preceding abdominal CT image 15, series 3. Note, imaging was degraded secondary to patient body habitus well as well as the lesion being located within the dome of the liver with associated interposition of lung. The procedure was planned. The right upper abdominal quadrant was prepped and draped in the usual sterile fashion. The overlying soft tissues were anesthetized with 1% lidocaine with epinephrine. A 17 gauge, 6.8 cm co-axial needle was advanced into a peripheral aspect of the lesion. This was followed by 6 core biopsies with an 18 gauge core device under direct ultrasound guidance. The coaxial needle tract was embolized with a small amount of Gel-Foam slurry and  superficial  hemostasis was obtained with manual compression. Post procedural scanning was negative for definitive area of hemorrhage or additional complication. A dressing was placed. The patient tolerated the procedure well without immediate post procedural complication. IMPRESSION: Technically challenging though ultimately successful ultrasound guided core needle biopsy of indeterminate mass within the dome of the right lobe of the liver. Electronically Signed   By: Sandi Mariscal M.D.   On: 07/07/2019 13:34   IR REMOVAL TUN ACCESS W/ PORT W/O FL MOD SED  Result Date: 07/28/2019 INDICATION: 64 year old female with nonfunctional port catheter EXAM: REMOVAL OF NONFUNCTIONAL PORT CATHETER. IMPLANTED PORT A CATH PLACEMENT WITH ULTRASOUND AND FLUOROSCOPIC GUIDANCE MEDICATIONS: 2 G ANCEF; The antibiotic was administered within an appropriate time interval prior to skin puncture. ANESTHESIA/SEDATION: Moderate (conscious) sedation was employed during this procedure. A total of Versed 4.0 mg and Fentanyl 150 mcg was administered intravenously. Moderate Sedation Time: 45 minutes. The patient's level of consciousness and vital signs were monitored continuously by radiology nursing throughout the procedure under my direct supervision. FLUOROSCOPY TIME:  One minutes, 24 seconds (73 mGy) COMPLICATIONS: None PROCEDURE: Informed consent was obtained from the patient following an explanation of the procedure, risks, benefits and alternatives. The patient understands, agrees and consents for the procedure. All questions were addressed. A time out was performed prior to the initiation of the procedure. The right neck and chest was prepped with chlorhexidine, and draped in the usual sterile fashion using maximum barrier technique (cap and mask, sterile gown, sterile gloves, large sterile sheet, hand hygiene and cutaneous antiseptic). Antibiotic prophylaxis was provided with 2.0g Ancef administered IV one hour prior to skin incision. Local  anesthesia was attained by infiltration with 1% lidocaine without epinephrine. The patient was positioned in the operation suite on the image intensifier table in the supine position. The right breast was taped to remove redundant tissue and attempt to recreate the position of the breast in an upright position to decrease any redundancy in the port catheter measurement. The previous scar on the right chest was generously infiltrated with 1% lidocaine for local anesthesia. Infiltration of the skin and subcutaneous tissues surrounding the port was performed. Using sharp and blunt dissection, the port apparatus and subcutaneous catheter were removed in their entirety. The port pocket was then closed with interrupted Vicryl layer within the deep tissues, an additional intermediate layer with 2 horizontal mattress sutures using Vicryl, and then a running subcuticular with 4-0 Monocryl. The skin was sealed with Derma bond. A sterile dressing was placed, including Steri-Strips. We then proceeded with placement of new port catheter. Ultrasound survey was performed with images stored and sent to PACs. Ultrasound demonstrated patency of the right internal jugular vein, and this was documented with an image. Under real-time ultrasound guidance, this vein was accessed with a 21 gauge micropuncture needle and image documentation was performed. A small dermatotomy was made at the access site with an 11 scalpel. A 0.018" wire was advanced into the SVC and used to estimate the length of the internal catheter. The access needle exchanged for a 68F micropuncture vascular sheath. The 0.018" wire was then removed and a 0.035" wire advanced into the IVC. An appropriate location for the subcutaneous reservoir was selected below the clavicle and an incision was made through the skin and underlying soft tissues. The subcutaneous tissues were then dissected using a combination of blunt and sharp surgical technique and a pocket was formed. A  single lumen power injectable portacatheter was then tunneled through the subcutaneous  tissues from the pocket to the dermatotomy and the port reservoir placed within the subcutaneous pocket. Prolene suture was then used to anchor the port within the port pocket. The venous access site was then serially dilated and a peel away vascular sheath placed over the wire. The wire was removed and the port catheter advanced into position under fluoroscopic guidance. The catheter tip is positioned at the superior cavoatrial junction. This was documented with a spot image. The portacatheter was then tested and found to flush and aspirate well. The port was flushed with saline followed by 100 units/mL heparinized saline. The pocket was then closed in two layers using first subdermal inverted interrupted absorbable sutures followed by a running subcuticular suture. The epidermis was then sealed with Dermabond. The dermatotomy at the venous access site was also seal with Dermabond. Patient tolerated the procedure well and remained hemodynamically stable throughout. No complications encountered and no significant blood loss encountered IMPRESSION: Status post removal of nonfunctional right IJ port catheter with placement of a new right IJ port catheter. Catheter ready for use. Signed, Dulcy Fanny. Dellia Nims, RPVI [facility] Electronically Signed   By: Corrie Mckusick D.O.   On: 07/28/2019 17:21   DG CHEST PORT 1 VIEW  Result Date: 07/27/2019 CLINICAL DATA:  Port-A-Cath placement. EXAM: PORTABLE CHEST 1 VIEW COMPARISON:  None. FINDINGS: A right-sided venous Port-A-Cath is seen with its distal tip overlying the right apex. This is within the region overlying the proximal portion of the right clavicle. Mild linear atelectasis is seen within the right lung base. There is a small left pleural effusion. No pneumothorax is identified. The cardiac silhouette is moderately  enlarged. The visualized skeletal structures are unremarkable. IMPRESSION: 1. Right-sided venous Port-A-Cath positioning, as described above. 2. Mild right basilar atelectasis. 3. Small left pleural effusion. Electronically Signed   By: Virgina Norfolk M.D.   On: 07/27/2019 22:43   DG Abd 2 Views  Result Date: 07/08/2019 CLINICAL DATA:  Abdominal pain and distension x3 days. EXAM: ABDOMEN - 2 VIEW COMPARISON:  None. FINDINGS: The bowel gas pattern is normal. A moderate to marked amount of stool is seen within the ascending colon. There is no evidence of free air. No radio-opaque calculi or other significant radiographic abnormality is seen. A radiopaque tubal ligation clip is seen within the pelvis on the right. An additional tubal ligation clip is seen overlying the superior aspect of the sacrum on the left. A 4 mm focal opacity is seen overlying the symphysis pubis on the left. IMPRESSION: 1. Normal bowel gas pattern. No evidence of free air. Electronically Signed   By: Virgina Norfolk M.D.   On: 07/08/2019 16:27   DG Abd Portable 1V  Result Date: 07/10/2019 CLINICAL DATA:  Abdominal pain EXAM: PORTABLE ABDOMEN - 1 VIEW COMPARISON:  07/08/2019 FINDINGS: Bowel gas pattern remains unremarkable. Stool burden is overall mild with greater burden within the ascending colon. Tubal ligation clips noted. IMPRESSION: Normal bowel gas pattern. Electronically Signed   By: Macy Mis M.D.   On: 07/10/2019 13:20   DG C-Arm 1-60 Min-No Report  Result Date: 07/24/2019 Fluoroscopy was utilized by the requesting physician.  No radiographic interpretation.   MR ABDOMEN MRCP W WO CONTAST  Result Date: 07/23/2019 CLINICAL DATA:  Inpatient. Pancreatic cancer with biopsy-proven liver metastases based on 07/07/2019 liver mass biopsy, now with hyperbilirubinemia. Chemotherapy initiated 07/21/2019. EXAM: MRI ABDOMEN WITHOUT AND WITH CONTRAST (INCLUDING MRCP) TECHNIQUE: Multiplanar multisequence MR imaging of the  abdomen was performed both before and after the administration of intravenous contrast. Heavily T2-weighted images of the biliary and pancreatic ducts were obtained, and three-dimensional MRCP images were rendered by post processing. CONTRAST:  24mL GADAVIST GADOBUTROL 1 MMOL/ML IV SOLN COMPARISON:  07/21/2019 CT abdomen/pelvis. FINDINGS: Lower chest: No acute abnormality at the lung bases. Hepatobiliary: There are numerous (greater than 15) similar liver masses scattered throughout the liver each demonstrating mild T2 hyperintensity, restricted diffusion and heterogeneous enhancement, compatible with liver metastases. Representative liver masses measure 5.1 x 3.8 cm in the segment 2 left liver lobe (series 3/image 19), 5.3 x 4.3 cm in the segment 8 right liver lobe (series 3/image 17) and 1.8 x 1.6 cm in the caudate lobe (series 3/image 21). Additional scattered simple liver cysts, largest 2.3 cm in the posterior right liver lobe. No hepatic steatosis. Nondistended gallbladder contains numerous gallstones, largest 10 mm. No definite gallbladder wall thickening. No significant pericholecystic fluid. Mild-to-moderate diffuse intrahepatic biliary ductal dilatation. Prominent proximal common bile duct dilation up to 18 mm diameter, with abrupt CBD caliber transition at the level of the pancreatic head. No choledocholithiasis. No beading of the intrahepatic bile ducts. Pancreas: Poorly marginated pancreatic head mass measuring approximately 5.3 x 4.9 cm (series 3/image 39) with mixed T1 and T2 signal intensity and thick irregular peripheral enhancement with central necrosis. Dilated main pancreatic duct up to 6 mm diameter. Spleen: Normal size. No mass. Adrenals/Urinary Tract: Normal adrenals. No hydronephrosis. Normal kidneys with no renal mass. Stomach/Bowel: Moderate hiatal hernia. Otherwise normal nondistended stomach. Visualized small and large bowel is normal caliber, with no bowel wall thickening.  Vascular/Lymphatic: Normal caliber abdominal aorta. Occluded portal splenic venous confluence with nonocclusive bland appearing thrombosis of the SMV (series 1103/image 60). Nonocclusive bland appearing thrombosis of main portal vein near the bifurcation with near occlusive thrombosis of the right portal vein. Patent left portal vein. Patent renal and hepatic veins and IVC. No pathologically enlarged lymph nodes in the abdomen. Other: No abdominal ascites or focal fluid collection. Musculoskeletal: No aggressive appearing focal osseous lesions. IMPRESSION: 1. Poorly marginated heterogeneously enhancing 5.3 cm pancreatic head mass compatible with known primary pancreatic malignancy. 2. 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 Mild-to-moderate diffuse intrahepatic biliary ductal dilatation. 3. 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 Near-occlusive bland appearing thrombosis of the right portal vein. 4. Widespread liver metastases. 5. Moderate hiatal hernia. Electronically Signed   By: Ilona Sorrel M.D.   On: 07/23/2019 14:09   IR INT EXT BILIARY DRAIN WITH CHOLANGIOGRAM  Result Date: 07/25/2019 INDICATION: Concern for metastatic pancreatic cancer, now with elevated LFTs and failed attempted endoscopic biliary stent placement. As such, request made for attempted placement of an internal/external biliary drainage catheter in hopes of improving the patient's LFTs so she may be a candidate for chemotherapy. Note, preceding cross-sectional imaging is negative for significant intrahepatic biliary ductal dilatation. EXAM: ULTRASOUND AND FLUOROSCOPIC GUIDED PERCUTANEOUS TRANSHEPATIC CHOLANGIOGRAM AND BILIARY TUBE PLACEMENT COMPARISON:  CT abdomen pelvis-07/21/2019; MRCP-07/23/2019 MEDICATIONS: Zosyn 3.75 g IV; the antibiotic was  administered with an appropriate frame prior to initiation of the procedure. CONTRAST:  62mL OMNIPAQUE IOHEXOL 300 MG/ML SOLN - administered into the biliary tree ANESTHESIA/SEDATION: Moderate (conscious) sedation was employed during this procedure. A total of Versed 8 mg and Fentanyl 300 mcg was administered intravenously. Moderate Sedation Time: 68 minutes. The patient's  level of consciousness and vital signs were monitored continuously by radiology nursing throughout the procedure under my direct supervision. FLUOROSCOPY TIME:  22 minutes ([id number] mGy) COMPLICATIONS: None immediate. TECHNIQUE: Informed written consent was obtained from the patient after a discussion of the risks, benefits and alternatives to treatment. Questions regarding the procedure were encouraged and answered. A timeout was performed prior to the initiation of the procedure. The right upper abdominal quadrant was prepped and draped in the usual sterile fashion, and a sterile drape was applied covering the operative field. Maximum barrier sterile technique with sterile gowns and gloves were used for the procedure. A timeout was performed prior to the initiation of the procedure. Ultrasound scanning of the right upper abdominal quadrant was performed to delineate the anatomy and avoid transgression of the gallbladder or the pleural. A spot along the right mid axillary line was marked fluoroscopically inferior to the right costophrenic angle. Sonographic evaluation was negative for significant dilatation of the intrahepatic biliary tree. Initial attempts were made to opacify a nondilated peripheral duct within the right lobe of the liver however despite transient opacification, the duct could not be cannulated. As such, under ultrasound guidance, a 22 gauge needle was advanced towards the central aspect of the biliary tree. Contrast was injected as the needle was slowly retracted ultimately opacifying a central intrahepatic biliary duct. The duct  was cannulated with a Nitrex wire which was advanced to the level of the CBD. Under fluoroscopic guidance, the access needle was exchanged for the inner 3 French catheter of the Accustick set. A limited percutaneous cholangiogram was then performed. Next, a now opacified nondilated duct within the posterior aspect the right lobe of the liver was targeted with a second 22 gauge needle allowing advancement of a Nitrex wire through the cannulated peripheral bile duct to the level of the CBD. The track was dilated with the Bertie set. Next, with the use of a 4 French angled glide catheter, a regular glidewire was advanced through the CBD past the distally obstructing lesion to the level of the duodenum. Contrast injection confirmed appropriate positioning. Over an Amplatz wire, the track was dilated ultimately allowing placement of an 8 French percutaneous biliary drainage catheter. Note, neither a 10 or 12 French percutaneous biliary drainage catheter was available at the time of this initial placement. Contrast was injected as several spot radiographic images were obtained in various obliquities. The biliary drainage catheter was connected to a gravity bag and secured in place with a interrupted suture and a Stat Lock device. The patient tolerated the procedure well without immediate postprocedural complication. FINDINGS: Sonographic evaluation was negative for any significant intrahepatic biliary ductal dilatation, as was demonstrated recent cross-sectional imaging. A central aspect of the right biliary tree was ultimately opacified and cannulated allowing access to a nondilated peripheral duct within posteroinferior segment of the right lobe of the liver utilizing a 2 stick technique, ultimately allowing placement of an 8 [place] biliary drainage catheter with end coiled locked within the duodenum. Limited percutaneous cholangiogram demonstrates long segment narrowing/occlusion involving the mid and peripheral  aspect of the CBD. IMPRESSION: Successful placement of an 8 French percutaneous biliary drainage catheter with end coiled and locked within the duodenum. PLAN: - Recommend obtaining daily CMP levels until a nadir of the patient's bilirubin level is obtained. - Note, the standard sized 10 French percutaneous biliary drainage catheter was not available at the of this placement (neither was a 30 [place]) and as such, an 8 [place] drainage catheter was  placed. As such, this drainage catheter may be upsized at a later date as clinically indicated. - Consideration for definitive internal biliary stent placement may be considered in 4-6 weeks pending the patient's ultimate plan of care. Electronically Signed   By: Sandi Mariscal M.D.   On: 07/25/2019 15:42   IR EXCHANGE BILIARY DRAIN  Result Date: 07/28/2019 INDICATION: 64 year old female with a history of pancreatic carcinoma, biliary obstruction, hyperbilirubinemia. Concern that the prior drain has become occluded. EXAM: EXCHANGE OF EXISTING INTERNAL/EXTERNAL BILIARY DRAIN MEDICATIONS: 2 g Ancef; The antibiotic was administered within an appropriate time frame prior to the initiation of the procedure. ANESTHESIA/SEDATION: Moderate (conscious) sedation was employed during this procedure. A total of Versed 2.0 mg and Fentanyl 50 mcg was administered intravenously. Moderate Sedation Time: 28 minutes. The patient's level of consciousness and vital signs were monitored continuously by radiology nursing throughout the procedure under my direct supervision. FLUOROSCOPY TIME:  Fluoroscopy Time: 0 minutes 36 seconds (29 mGy). COMPLICATIONS: None PROCEDURE: Informed written consent was obtained from the patient after a thorough discussion of the procedural risks, benefits and alternatives. All questions were addressed. Maximal Sterile Barrier Technique was utilized including caps, mask, sterile gowns, sterile gloves, sterile drape, hand hygiene and skin antiseptic. A timeout was  performed prior to the initiation of the procedure. Patient was positioned supine position on the fluoroscopy table. The right upper quadrant was prepped and draped in the usual sterile fashion including the indwelling drain. 1% lidocaine was used for local anesthesia at the skin entry site. Contrast was injected through the indwelling drain, with patent drain into the entry site of the right biliary system. The limb of the drain across the common bile duct appears occluded. Drain was ligated and the suture was ligated. Bentson wire was advanced through the drain into the duodenum and the drain was removed. 45 French straight sheath was advanced into the biliary system with the introducer removed. Images were acquired after gentle contrast injection. This confirmed obstruction remains at the common bile duct. We then used modified Seldinger technique to place a new 12 [place] biliary drain across the obstruction. Loop was formed in the duodenum and final contrast injection confirmed antegrade contrast flow into the duodenum. Drain was sutured in position attached to gravity drainage. Patient tolerated the procedure well and remained hemodynamically stable throughout. No complications were encountered and no significant blood loss IMPRESSION: Status post exchange of internal/external biliary drain with placement of a new 12 French drain. Signed, Dulcy Fanny. Dellia Nims, RPVI [facility] Radiology Electronically Signed   By: Corrie Mckusick D.O.   On: 07/28/2019 17:26   IR IMAGING GUIDED PORT INSERTION  Result Date: 07/28/2019 INDICATION: 64 year old female with nonfunctional port catheter EXAM: REMOVAL OF NONFUNCTIONAL PORT CATHETER. IMPLANTED PORT A CATH PLACEMENT WITH ULTRASOUND AND FLUOROSCOPIC GUIDANCE MEDICATIONS: 2 G ANCEF; The antibiotic was administered within an appropriate time interval prior to skin puncture. ANESTHESIA/SEDATION: Moderate (conscious)  sedation was employed during this procedure. A total of Versed 4.0 mg and Fentanyl 150 mcg was administered intravenously. Moderate Sedation Time: 45 minutes. The patient's level of consciousness and vital signs were monitored continuously by radiology nursing throughout the procedure under my direct supervision. FLUOROSCOPY TIME:  One minutes, 24 seconds (73 mGy) COMPLICATIONS: None PROCEDURE: Informed consent was obtained from the patient following an explanation of the procedure, risks, benefits and alternatives. The patient understands, agrees and consents for the procedure. All questions were addressed. A time out was performed prior  to the initiation of the procedure. The right neck and chest was prepped with chlorhexidine, and draped in the usual sterile fashion using maximum barrier technique (cap and mask, sterile gown, sterile gloves, large sterile sheet, hand hygiene and cutaneous antiseptic). Antibiotic prophylaxis was provided with 2.0g Ancef administered IV one hour prior to skin incision. Local anesthesia was attained by infiltration with 1% lidocaine without epinephrine. The patient was positioned in the operation suite on the image intensifier table in the supine position. The right breast was taped to remove redundant tissue and attempt to recreate the position of the breast in an upright position to decrease any redundancy in the port catheter measurement. The previous scar on the right chest was generously infiltrated with 1% lidocaine for local anesthesia. Infiltration of the skin and subcutaneous tissues surrounding the port was performed. Using sharp and blunt dissection, the port apparatus and subcutaneous catheter were removed in their entirety. The port pocket was then closed with interrupted Vicryl layer within the deep tissues, an additional intermediate layer with 2 horizontal mattress sutures using Vicryl, and then a running subcuticular with 4-0 Monocryl. The skin was sealed with Derma  bond. A sterile dressing was placed, including Steri-Strips. We then proceeded with placement of new port catheter. Ultrasound survey was performed with images stored and sent to PACs. Ultrasound demonstrated patency of the right internal jugular vein, and this was documented with an image. Under real-time ultrasound guidance, this vein was accessed with a 21 gauge micropuncture needle and image documentation was performed. A small dermatotomy was made at the access site with an 11 scalpel. A 0.018" wire was advanced into the SVC and used to estimate the length of the internal catheter. The access needle exchanged for a 91F micropuncture vascular sheath. The 0.018" wire was then removed and a 0.035" wire advanced into the IVC. An appropriate location for the subcutaneous reservoir was selected below the clavicle and an incision was made through the skin and underlying soft tissues. The subcutaneous tissues were then dissected using a combination of blunt and sharp surgical technique and a pocket was formed. A single lumen power injectable portacatheter was then tunneled through the subcutaneous tissues from the pocket to the dermatotomy and the port reservoir placed within the subcutaneous pocket. Prolene suture was then used to anchor the port within the port pocket. The venous access site was then serially dilated and a peel away vascular sheath placed over the wire. The wire was removed and the port catheter advanced into position under fluoroscopic guidance. The catheter tip is positioned at the superior cavoatrial junction. This was documented with a spot image. The portacatheter was then tested and found to flush and aspirate well. The port was flushed with saline followed by 100 units/mL heparinized saline. The pocket was then closed in two layers using first subdermal inverted interrupted absorbable sutures followed by a running subcuticular suture. The epidermis was then sealed with Dermabond. The  dermatotomy at the venous access site was also seal with Dermabond. Patient tolerated the procedure well and remained hemodynamically stable throughout. No complications encountered and no significant blood loss encountered IMPRESSION: Status post removal of nonfunctional right IJ port catheter with placement of a new right IJ port catheter. Catheter ready for use. Signed, Dulcy Fanny. Dellia Nims, RPVI [facility] Radiology Electronically Signed   By: Corrie Mckusick D.O.   On: 07/28/2019 17:21   IR IMAGING GUIDED PORT INSERTION  Result Date: 07/14/2019 CLINICAL DATA:  Pancreatic  carcinoma, needs poor for planned chemotherapy regimen EXAM: TUNNELED PORT CATHETER PLACEMENT WITH ULTRASOUND AND FLUOROSCOPIC GUIDANCE FLUOROSCOPY TIME:  0.6 minute; 52  uGym2 DAP ANESTHESIA/SEDATION: Intravenous Fentanyl 58mcg and Versed 2mg  were administered as conscious sedation during continuous monitoring of the patient's level of consciousness and physiological / cardiorespiratory status by the radiology RN, with a total moderate sedation time of 21 minutes. TECHNIQUE: The procedure, risks, benefits, and alternatives were explained to the patient. Questions regarding the procedure were encouraged and answered. The patient understands and consents to the procedure. As antibiotic prophylaxis, cefazolin 2 g was ordered pre-procedure and administered intravenously within one hour of incision. Patency of the right IJ vein was confirmed with ultrasound with image documentation. An appropriate skin site was determined. Skin site was marked. Region was prepped using maximum barrier technique including cap and mask, sterile gown, sterile gloves, large sterile sheet, and Chlorhexidine as cutaneous antisepsis. The region was infiltrated locally with 1% lidocaine. Under real-time ultrasound guidance, the right IJ vein was accessed with a 21 gauge micropuncture needle; the needle tip within the vein  was confirmed with ultrasound image documentation. Needle was exchanged over a 018 guidewire for transitional dilator, and vascular measurement was performed. A small incision was made on the right anterior chest wall and a subcutaneous pocket fashioned. The power-injectable port was positioned and its catheter tunneled to the right IJ dermatotomy site. The transitional dilator was exchanged over an Amplatz wire for a peel-away sheath, through which the port catheter, which had been trimmed to the appropriate length, was advanced and positioned under fluoroscopy with its tip at the cavoatrial junction. Spot chest radiograph confirms good catheter position and no pneumothorax. The port was flushed per protocol. The pocket was closed with deep interrupted and subcuticular continuous 3-0 Monocryl sutures. The incisions were covered with Dermabond then covered with a sterile dressing. The patient tolerated the procedure well. COMPLICATIONS: COMPLICATIONS None immediate IMPRESSION: Technically successful right IJ power-injectable port catheter placement. Ready for routine use. Electronically Signed   By: Lucrezia Europe M.D.   On: 07/14/2019 13:39    ASSESSMENT AND PLAN: 1. Metastatic pancreatic adenocarcinoma -CT of the abdomen pelvis on 07/02/2019 showed a 3.4 x 2.2 x 3  cm mass in the head of the pancreas with multiple ill-defined  hypodense liver masses. -CT of the chest on 07/07/2019 showed no evidence of thoracic  metastasis. -Ultrasound guided liver biopsy on 07/07/2019 demonstrated  metastatic pancreatic adenocarcinoma. 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 Chronicity, now with mild microcytic anemia 4. Hypokalemia-continue to replete IV/p.o. 5. Uterine fibroids 6. Abdominal pain secondary to #1 7. Nausea 8.Hyperbilirubinemia, due to a malignant common bile duct stricture MRI/MRCP pancreas head mass, malignant common bile duct stricture at the level of the pancreas head dilatation of the proximal common bile duct intrahepatic biliary ductal dilatation leg appearing nonocclusive thrombus at the SMV and vein.  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  Numerous liver metastases  Ms. Bickley appears stable.  She has persistent hyperbilirubinemia, but total bilirubin is  slowly trending downward.  Biliary drain appears to be functioning well.  She continues to have mild hypokalemia which has improved with potassium replacement.  She has persistent leukocytosis, anemia, and thrombocytopenia.  She remains afebrile without any signs of infection. ?leukemoid reaction.  She has no signs of bleeding.  She been evaluated by PT who has recommended SNF.  The patient is requesting a second opinion at [facility] which we are in the process of trying to arrange.  1.  The patient was encouraged to work with PT. 2.  Recommend SNF placement after discharge for rehabilitation.  Transition of care consult was placed yesterday and FL 2 has been sent. 3.  She was encouraged to increase her oral intake.  Dietitian is following. 4.  We will attempt to arrange for telehealth visit for second opinion at [facility]. 5.  We are continuing goals of care discussion.  The patient and her sister do not wish to enroll in hospice at this time.  They would like to see if the patient can get stronger so that she can receive chemotherapy.  I again  stressed the importance of improving her nutritional status and working with physical therapy.   LOS: 4 days   Mikey Bussing, DNP, AGPCNP-BC, AOCNP 08/05/19 Ms. Pry was interviewed and examined.  She appears unchanged.  She continues to have limited oral intake and difficulty getting out of bed.  I encouraged her to increase her diet and ambulation as tolerated.  I will try to arrange for a telehealth consultation with the GI oncology service at [facility].

## 2019-08-05 NOTE — Progress Notes (Signed)
Inpatient Diabetes Program Recommendations  AACE/ADA: New Consensus Statement on Inpatient Glycemic Control (2015)  Target Ranges:  Prepandial:   less than 140 mg/dL      Peak postprandial:   less than 180 mg/dL (1-2 hours)      Critically ill patients:  140 - 180 mg/dL   Lab Results  Component Value Date   ATFTDD 220 (H) 08/05/2019   HGBA1C 6.5 (H) 07/03/2019    Review of Glycemic Control  Diabetes history: None Outpatient Diabetes medications: None Current orders for Inpatient glycemic control: Novolog 0-9 units tidwc  HgbA1C - 6.5% on 07/03/19 Has been on steroids recently  Inpatient Diabetes Program Recommendations:     Glucose meter for home to check blood sugars occasionally. May need correction insulin at home.   Blood glucose meter kit - Order # [id number]  Will continue to follow.  Thank you. Lorenda Peck, RD, LDN, CDE Inpatient Diabetes Coordinator [phone]

## 2019-08-06 DIAGNOSIS — R112 Nausea with vomiting, unspecified: Secondary | ICD-10-CM

## 2019-08-06 DIAGNOSIS — M549 Dorsalgia, unspecified: Secondary | ICD-10-CM

## 2019-08-06 DIAGNOSIS — K55059 Acute (reversible) ischemia of intestine, part and extent unspecified: Secondary | ICD-10-CM

## 2019-08-06 LAB — COMPREHENSIVE METABOLIC PANEL
ALT: 94 U/L — ABNORMAL HIGH (ref 0–44)
AST: 69 U/L — ABNORMAL HIGH (ref 15–41)
Albumin: 1.4 g/dL — ABNORMAL LOW (ref 3.5–5.0)
Alkaline Phosphatase: 182 U/L — ABNORMAL HIGH (ref 38–126)
Anion gap: 8 (ref 5–15)
BUN: 15 mg/dL (ref 8–23)
CO2: 25 mmol/L (ref 22–32)
Calcium: 10.7 mg/dL — ABNORMAL HIGH (ref 8.9–10.3)
Chloride: 104 mmol/L (ref 98–111)
Creatinine, Ser: 0.43 mg/dL — ABNORMAL LOW (ref 0.44–1.00)
GFR calc Af Amer: >60 mL/min (ref >60)
GFR calc non Af Amer: >60 mL/min (ref >60)
Glucose, Bld: 184 mg/dL — ABNORMAL HIGH (ref 70–99)
Potassium: 3.8 mmol/L (ref 3.5–5.1)
Sodium: 137 mmol/L (ref 135–145)
Total Bilirubin: 11.1 mg/dL — ABNORMAL HIGH (ref 0.3–1.2)
Total Protein: 5.7 g/dL — ABNORMAL LOW (ref 6.5–8.1)

## 2019-08-06 LAB — CBC WITH DIFFERENTIAL/PLATELET
Abs Immature Granulocytes: 0.55 10*3/uL — ABNORMAL HIGH (ref 0.00–0.07)
Basophils Absolute: 0.1 10*3/uL (ref 0.0–0.1)
Basophils Relative: 0 %
Eosinophils Absolute: 0.2 10*3/uL (ref 0.0–0.5)
Eosinophils Relative: 1 %
HCT: 29.6 % — ABNORMAL LOW (ref 36.0–46.0)
Hemoglobin: 9 g/dL — ABNORMAL LOW (ref 12.0–15.0)
Immature Granulocytes: 2 %
Lymphocytes Relative: 5 %
Lymphs Abs: 1.1 10*3/uL (ref 0.7–4.0)
MCH: 25 pg — ABNORMAL LOW (ref 26.0–34.0)
MCHC: 30.4 g/dL (ref 30.0–36.0)
MCV: 82.2 fL (ref 80.0–100.0)
Monocytes Absolute: 0.8 10*3/uL (ref 0.1–1.0)
Monocytes Relative: 3 %
Neutro Abs: 20.9 10*3/uL — ABNORMAL HIGH (ref 1.7–7.7)
Neutrophils Relative %: 89 %
Platelets: 91 10*3/uL — ABNORMAL LOW (ref 150–400)
RBC: 3.6 MIL/uL — ABNORMAL LOW (ref 3.87–5.11)
RDW: 26.3 % — ABNORMAL HIGH (ref 11.5–15.5)
WBC: 23.5 10*3/uL — ABNORMAL HIGH (ref 4.0–10.5)
nRBC: 1.1 % — ABNORMAL HIGH (ref 0.0–0.2)

## 2019-08-06 LAB — GLUCOSE, CAPILLARY
Glucose-Capillary: 168 mg/dL — ABNORMAL HIGH (ref 70–99)
Glucose-Capillary: 184 mg/dL — ABNORMAL HIGH (ref 70–99)
Glucose-Capillary: 157 mg/dL — ABNORMAL HIGH (ref 70–99)

## 2019-08-06 LAB — SARS CORONAVIRUS 2 (TAT 6-24 HRS): SARS Coronavirus 2: NEGATIVE

## 2019-08-06 MED ORDER — MORPHINE SULFATE 2 MG/ML IJ SOLN: 2.0000 mg | INTRAMUSCULAR | Status: DC | PRN

## 2019-08-06 MED ORDER — MORPHINE SULFATE (PF) 2 MG/ML IV SOLN
2.0000 mg | INTRAVENOUS | Status: DC | PRN
  Administered 2019-08-06: 2 mg via INTRAVENOUS
  Administered 2019-08-06: 4 mg via INTRAVENOUS
  Administered 2019-08-07 – 2019-08-09 (×5): 2 mg via INTRAVENOUS
  Filled 2019-08-06 (×6): qty 1
  Filled 2019-08-06: qty 2

## 2019-08-06 MED ORDER — ZOLEDRONIC ACID 4 MG/5ML IV CONC
4.0000 mg | Freq: Once | INTRAVENOUS | Status: AC
  Administered 2019-08-06: 4 mg via INTRAVENOUS
  Filled 2019-08-06: qty 5

## 2019-08-06 NOTE — Progress Notes (Addendum)
HEMATOLOGY-ONCOLOGY PROGRESS NOTE  SUBJECTIVE: Main complaint is back pain. She reports getting up to chair with physical therapy yesterday. PT note indicates significant assistance required. She is agreeable to going to a skilled nursing facility.   Oncology History  Pancreatic adenocarcinoma ([place])  07/10/2019 Initial Diagnosis   Pancreatic adenocarcinoma ([place])   07/21/2019 -  Chemotherapy   The patient had PACLitaxel-protein bound (ABRAXANE) chemo infusion 225 mg, 100 mg/m2 = 225 mg (original dose ), Intravenous,  Once, 0 of 4 cycles Dose modification: 100 mg/m2 (Cycle 1, Reason: Provider Judgment) gemcitabine (GEMZAR) 2,280 mg in sodium chloride 0.9 % 250 mL chemo infusion, 1,000 mg/m2 = 2,280 mg, Intravenous,  Once, 0 of 4 cycles  for chemotherapy treatment.     PHYSICAL EXAMINATION:  Vitals:   08/05/19 2126 08/06/19 0604  BP: (!) 143/77 134/72  Pulse: 92 95  Resp: 17 17  Temp: (!) 97.5 F (36.4 C) 98 F (36.7 C)  SpO2: 99% 99%   Filed Weights   08/01/19 1458  Weight: 241 lb 1.6 oz (109.4 kg)    Intake/Output from previous day: 02/02 0701 - 02/03 0700 In: -  Out: 3100 [Drains:3100]  GENERAL: Lethargic, arouses to verbal stimuli. EYES: Scleral icterus  OROPHARYNX: No thrush or mucositis LUNGS: clear to auscultation and percussion with normal breathing effort HEART: regular rate & rhythm, trace pitting edema in the lower extremities ABDOMEN:abdomen soft, no hepatomegaly, tender right abdomen. Right abdomen drain site with small amount of drainage. NEURO: lethargic, arouses to verbal stimuli, follows commands  Port-A-Cath without erythema  LABORATORY DATA:  I have reviewed the data as listed CMP Latest Ref Rng & Units 08/06/2019 08/05/2019 08/04/2019  Glucose 70 - 99 mg/dL 184(H) 258(H) 213(H)  BUN 8 - 23 mg/dL 15 16 16   Creatinine 0.44 - 1.00 mg/dL 0.43(L) 0.56 0.43(L)  Sodium 135 - 145 mmol/L 137 138 139  Potassium 3.5 - 5.1 mmol/L 3.8 3.4(L) 3.2(L)  Chloride 98 -  111 mmol/L 104 106 105  CO2 22 - 32 mmol/L 25 25 25   Calcium 8.9 - 10.3 mg/dL 10.7(H) 9.9 9.6  Total Protein 6.5 - 8.1 g/dL 5.7(L) 5.6(L) 5.8(L)  Total Bilirubin 0.3 - 1.2 mg/dL 11.1(H) 10.7(H) 11.1(H)  Alkaline Phos 38 - 126 U/L 182(H) 172(H) 188(H)  AST 15 - 41 U/L 69(H) 58(H) 57(H)  ALT 0 - 44 U/L 94(H) 85(H) 87(H)    Lab Results  Component Value Date   WBC 23.5 (H) 08/06/2019   HGB 9.0 (L) 08/06/2019   HCT 29.6 (L) 08/06/2019   MCV 82.2 08/06/2019   PLT 91 (L) 08/06/2019   NEUTROABS 20.9 (H) 08/06/2019    X-ray chest PA and lateral  Result Date: 08/01/2019 CLINICAL DATA:  Shortness of breath. EXAM: CHEST - 2 VIEW COMPARISON:  None. FINDINGS: A right Port-A-Cath terminates in the central SVC. Bibasilar opacities have platelike components. No pneumothorax. The cardiomediastinal silhouette is stable. No other acute abnormalities. IMPRESSION: Bibasilar platelike opacities likely represent atelectasis. Underlying infiltrates not excluded. Electronically Signed   By: Dorise Bullion III M.D   On: 08/01/2019 19:20   CT ABDOMEN PELVIS W CONTRAST  Result Date: 07/21/2019 CLINICAL DATA:  History of recently diagnosed metastatic pancreatic cancer. EXAM: CT ABDOMEN AND PELVIS WITH CONTRAST TECHNIQUE: Multidetector CT imaging of the abdomen and pelvis was performed using the standard protocol following bolus administration of intravenous contrast. CONTRAST:  151mL OMNIPAQUE IOHEXOL 300 MG/ML  SOLN COMPARISON:  July 02, 2019 FINDINGS: Lower chest: Mild atelectasis is seen within the  anterior aspect of the bilateral lung bases. Hepatobiliary: Numerous heterogeneous low-attenuation liver lesions are seen. These are mildly increased in size when compared to the prior study. Ill-defined gallstones are seen within a contracted gallbladder. Pancreas: A 5.6 cm x 6.3 cm x 5.4 cm complex, partially cystic mass is seen involving the body and head of the pancreas Spleen: Normal in size without focal  abnormality. Adrenals/Urinary Tract: Adrenal glands are unremarkable. Kidneys are normal, without renal calculi, focal lesion, or hydronephrosis. Bladder is unremarkable. Stomach/Bowel: There is a large gastric hernia. Appendix appears normal. No evidence of bowel wall thickening, distention, or inflammatory changes. Vascular/Lymphatic: Reproductive: A 12.8 cm x 8.6 cm heterogeneous mass is seen extending from the anterolateral aspect of the uterine fundus on the left. This contains soft tissue and fat density components. Additional 6.8 cm x 6.9 cm, 5.0 cm diameter and 3.7 cm diameter homogeneous uterine mass is seen. Other: Small fat containing ventral hernias are seen along the midline of the lower abdomen. Musculoskeletal: No acute or significant osseous findings. IMPRESSION: 1. 5.6 cm x 6.3 cm x 5.4 cm complex pancreatic mass which is increased in size when compared to the prior study dated July 02, 2019. 2. Numerous heterogeneous liver lesions consistent with hepatic metastasis. 3. Cholelithiasis. 4. Large gastric hernia. 5. Multiple large uterine masses which may represent large uterine fibroids. Electronically Signed   By: Virgina Norfolk M.D.   On: 07/21/2019 21:34   MR 3D Recon At Scanner  Result Date: 07/23/2019 CLINICAL DATA:  Inpatient. Pancreatic cancer with biopsy-proven liver metastases based on 07/07/2019 liver mass biopsy, now with hyperbilirubinemia. Chemotherapy initiated 07/21/2019. EXAM: MRI ABDOMEN WITHOUT AND WITH CONTRAST (INCLUDING MRCP) TECHNIQUE: Multiplanar multisequence MR imaging of the abdomen was performed both before and after the administration of intravenous contrast. Heavily T2-weighted images of the biliary and pancreatic ducts were obtained, and three-dimensional MRCP images were rendered by post processing. CONTRAST:  64mL GADAVIST GADOBUTROL 1 MMOL/ML IV SOLN COMPARISON:  07/21/2019 CT abdomen/pelvis. FINDINGS: Lower chest: No acute abnormality at the lung bases.  Hepatobiliary: There are numerous (greater than 15) similar liver masses scattered throughout the liver each demonstrating mild T2 hyperintensity, restricted diffusion and heterogeneous enhancement, compatible with liver metastases. Representative liver masses measure 5.1 x 3.8 cm in the segment 2 left liver lobe (series 3/image 19), 5.3 x 4.3 cm in the segment 8 right liver lobe (series 3/image 17) and 1.8 x 1.6 cm in the caudate lobe (series 3/image 21). Additional scattered simple liver cysts, largest 2.3 cm in the posterior right liver lobe. No hepatic steatosis. Nondistended gallbladder contains numerous gallstones, largest 10 mm. No definite gallbladder wall thickening. No significant pericholecystic fluid. Mild-to-moderate diffuse intrahepatic biliary ductal dilatation. Prominent proximal common bile duct dilation up to 18 mm diameter, with abrupt CBD caliber transition at the level of the pancreatic head. No choledocholithiasis. No beading of the intrahepatic bile ducts. Pancreas: Poorly marginated pancreatic head mass measuring approximately 5.3 x 4.9 cm (series 3/image 39) with mixed T1 and T2 signal intensity and thick irregular peripheral enhancement with central necrosis. Dilated main pancreatic duct up to 6 mm diameter. Spleen: Normal size. No mass. Adrenals/Urinary Tract: Normal adrenals. No hydronephrosis. Normal kidneys with no renal mass. Stomach/Bowel: Moderate hiatal hernia. Otherwise normal nondistended stomach. Visualized small and large bowel is normal caliber, with no bowel wall thickening. Vascular/Lymphatic: Normal caliber abdominal aorta. Occluded portal splenic venous confluence with nonocclusive bland appearing thrombosis of the SMV (series 1103/image 60). Nonocclusive bland appearing thrombosis of main  portal vein near the bifurcation with near occlusive thrombosis of the right portal vein. Patent left portal vein. Patent renal and hepatic veins and IVC. No pathologically enlarged lymph  nodes in the abdomen. Other: No abdominal ascites or focal fluid collection. Musculoskeletal: No aggressive appearing focal osseous lesions. IMPRESSION: 1. Poorly marginated heterogeneously enhancing 5.3 cm pancreatic head mass compatible with known primary pancreatic malignancy. 2. 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 Mild-to-moderate diffuse intrahepatic biliary ductal dilatation. 3. 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 Near-occlusive bland appearing thrombosis of the right portal vein. 4. Widespread liver metastases. 5. Moderate hiatal hernia. Electronically Signed   By: Ilona Sorrel M.D.   On: 07/23/2019 14:09   US BIOPSY (LIVER)  Result Date: 07/07/2019 INDICATION: Concern for metastatic pancreatic cancer. Please perform ultrasound-guided liver lesion biopsy for tissue diagnostic purposes. EXAM: ULTRASOUND GUIDED LIVER LESION BIOPSY COMPARISON:  CT abdomen and pelvis-07/02/2019 MEDICATIONS: None ANESTHESIA/SEDATION: Fentanyl 100 mcg IV; Versed 2 mg IV Total Moderate Sedation time:  17 Minutes. The patient's level of consciousness and vital signs were monitored continuously by radiology nursing throughout the procedure under my direct supervision. COMPLICATIONS: None immediate. PROCEDURE: Informed written consent was obtained from the patient after a discussion of the risks, benefits and alternatives to treatment. The patient understands and consents the procedure. A timeout was performed prior to the initiation of the procedure. Ultrasound scanning was performed of the right upper abdominal quadrant demonstrates an ill-defined approximately 2.9 x 3.1 x 2.2 cm hypoechoic nodule/mass within the dome of the right lobe of the liver correlating with the indeterminate mass seen on preceding abdominal CT image 15, series 3.  Note, imaging was degraded secondary to patient body habitus well as well as the lesion being located within the dome of the liver with associated interposition of lung. The procedure was planned. The right upper abdominal quadrant was prepped and draped in the usual sterile fashion. The overlying soft tissues were anesthetized with 1% lidocaine with epinephrine. A 17 gauge, 6.8 cm co-axial needle was advanced into a peripheral aspect of the lesion. This was followed by 6 core biopsies with an 18 gauge core device under direct ultrasound guidance. The coaxial needle tract was embolized with a small amount of Gel-Foam slurry and superficial hemostasis was obtained with manual compression. Post procedural scanning was negative for definitive area of hemorrhage or additional complication. A dressing was placed. The patient tolerated the procedure well without immediate post procedural complication. IMPRESSION: Technically challenging though ultimately successful ultrasound guided core needle biopsy of indeterminate mass within the dome of the right lobe of the liver. Electronically Signed   By: Sandi Mariscal M.D.   On: 07/07/2019 13:34   IR REMOVAL TUN ACCESS W/ PORT W/O FL MOD SED  Result Date: 07/28/2019 INDICATION: 64 year old female with nonfunctional port catheter EXAM: REMOVAL OF NONFUNCTIONAL PORT CATHETER. IMPLANTED PORT A CATH PLACEMENT WITH ULTRASOUND AND FLUOROSCOPIC GUIDANCE MEDICATIONS: 2 G ANCEF; The antibiotic was administered within an appropriate time interval prior to skin puncture. ANESTHESIA/SEDATION: Moderate (conscious) sedation was employed during this procedure. A total of Versed 4.0 mg and Fentanyl 150 mcg was administered intravenously. Moderate Sedation Time: 45 minutes. The patient's level of consciousness and vital signs were monitored continuously by radiology nursing throughout the procedure under my direct supervision. FLUOROSCOPY TIME:  One minutes, 24 seconds (73 mGy) COMPLICATIONS:  None PROCEDURE: Informed consent was obtained from the patient following an explanation of the procedure, risks, benefits and alternatives. The patient understands, agrees and consents for the procedure. All questions were addressed. A time out was performed prior to the initiation of the procedure. The right neck and chest was prepped with chlorhexidine, and draped in the usual sterile fashion using maximum barrier technique (cap and mask, sterile gown, sterile gloves, large sterile sheet, hand hygiene and cutaneous antiseptic). Antibiotic prophylaxis was provided with 2.0g Ancef administered IV one hour prior to skin incision. Local anesthesia was attained by infiltration with 1% lidocaine without epinephrine. The patient was positioned in the operation suite on the image intensifier table in the supine position. The right breast was taped to remove redundant tissue and attempt to recreate the position of the breast in an upright position to decrease any redundancy in the port catheter measurement. The previous scar on the right chest was generously infiltrated with 1% lidocaine for local anesthesia. Infiltration of the skin and subcutaneous tissues surrounding the port was performed. Using sharp and blunt dissection, the port apparatus and subcutaneous catheter were removed in their entirety. The port pocket was then closed with interrupted Vicryl layer within the deep tissues, an additional intermediate layer with 2 horizontal mattress sutures using Vicryl, and then a running subcuticular with 4-0 Monocryl. The skin was sealed with Derma bond. A sterile dressing was placed, including Steri-Strips. We then proceeded with placement of new port catheter. Ultrasound survey was performed with images stored and sent to PACs. Ultrasound demonstrated patency of the right internal jugular vein, and this was documented with an image. Under real-time ultrasound guidance, this vein was accessed with a 21 gauge micropuncture  needle and image documentation was performed. A small dermatotomy was made at the access site with an 11 scalpel. A 0.018" wire was advanced into the SVC and used to estimate the length of the internal catheter. The access needle exchanged for a 57F micropuncture vascular sheath. The 0.018" wire was then removed and a 0.035" wire advanced into the IVC. An appropriate location for the subcutaneous reservoir was selected below the clavicle and an incision was made through the skin and underlying soft tissues. The subcutaneous tissues were then dissected using a combination of blunt and sharp surgical technique and a pocket was formed. A single lumen power injectable portacatheter was then tunneled through the subcutaneous tissues from the pocket to the dermatotomy and the port reservoir placed within the subcutaneous pocket. Prolene suture was then used to anchor the port within the port pocket. The venous access site was then serially dilated and a peel away vascular sheath placed over the wire. The wire was removed and the port catheter advanced into position under fluoroscopic guidance. The catheter tip is positioned at the superior cavoatrial junction. This was documented with a spot image. The portacatheter was then tested and found to flush and aspirate well. The port was flushed with saline followed by 100 units/mL heparinized saline. The pocket was then closed in two layers using first subdermal inverted interrupted absorbable sutures followed by a running subcuticular suture. The epidermis was then sealed with Dermabond. The dermatotomy at the venous access site was also seal with Dermabond. Patient tolerated the procedure well and remained hemodynamically stable throughout. No complications encountered and no significant blood loss encountered IMPRESSION: Status post removal of nonfunctional right IJ port catheter with placement of a new right IJ port catheter. Catheter ready for use. Signed, Dulcy Fanny. Earleen Newport,  DO, RPVI [facility] Electronically Signed   By: Corrie Mckusick D.O.   On: 07/28/2019 17:21   DG CHEST PORT 1 VIEW  Result Date: 07/27/2019 CLINICAL DATA:  Port-A-Cath placement. EXAM: PORTABLE CHEST 1 VIEW COMPARISON:  None. FINDINGS: A right-sided venous Port-A-Cath is seen with its distal tip overlying the right apex. This is within the region overlying the proximal portion of the right clavicle. Mild linear atelectasis is seen within the right lung base. There is a small left pleural effusion. No pneumothorax is identified. The cardiac silhouette is moderately enlarged. The visualized skeletal structures are unremarkable. IMPRESSION: 1. Right-sided venous Port-A-Cath positioning, as described above. 2. Mild right basilar atelectasis. 3. Small left pleural effusion. Electronically Signed   By: Virgina Norfolk M.D.   On: 07/27/2019 22:43   DG Abd 2 Views  Result Date: 07/08/2019 CLINICAL DATA:  Abdominal pain and distension x3 days. EXAM: ABDOMEN - 2 VIEW COMPARISON:  None. FINDINGS: The bowel gas pattern is normal. A moderate to marked amount of stool is seen within the ascending colon. There is no evidence of free air. No radio-opaque calculi or other significant radiographic abnormality is seen. A radiopaque tubal ligation clip is seen within the pelvis on the right. An additional tubal ligation clip is seen overlying the superior aspect of the sacrum on the left. A 4 mm focal opacity is seen overlying the symphysis pubis on the left. IMPRESSION: 1. Normal bowel gas pattern. No evidence of free air. Electronically Signed   By: Virgina Norfolk M.D.   On: 07/08/2019 16:27   DG Abd Portable 1V  Result Date: 07/10/2019 CLINICAL DATA:  Abdominal pain EXAM: PORTABLE ABDOMEN - 1 VIEW COMPARISON:  07/08/2019 FINDINGS: Bowel gas pattern remains unremarkable. Stool burden is overall mild with greater burden within the ascending colon. Tubal ligation  clips noted. IMPRESSION: Normal bowel gas pattern. Electronically Signed   By: Macy Mis M.D.   On: 07/10/2019 13:20   DG C-Arm 1-60 Min-No Report  Result Date: 07/24/2019 Fluoroscopy was utilized by the requesting physician.  No radiographic interpretation.   MR ABDOMEN MRCP W WO CONTAST  Result Date: 07/23/2019 CLINICAL DATA:  Inpatient. Pancreatic cancer with biopsy-proven liver metastases based on 07/07/2019 liver mass biopsy, now with hyperbilirubinemia. Chemotherapy initiated 07/21/2019. EXAM: MRI ABDOMEN WITHOUT AND WITH CONTRAST (INCLUDING MRCP) TECHNIQUE: Multiplanar multisequence MR imaging of the abdomen was performed both before and after the administration of intravenous contrast. Heavily T2-weighted images of the biliary and pancreatic ducts were obtained, and three-dimensional MRCP images were rendered by post processing. CONTRAST:  14mL GADAVIST GADOBUTROL 1 MMOL/ML IV SOLN COMPARISON:  07/21/2019 CT abdomen/pelvis. FINDINGS: Lower chest: No acute abnormality at the lung bases. Hepatobiliary: There are numerous (greater than 15) similar liver masses scattered throughout the liver each demonstrating mild T2 hyperintensity, restricted diffusion and heterogeneous enhancement, compatible with liver metastases. Representative liver masses measure 5.1 x 3.8 cm in the segment 2 left liver lobe (series 3/image 19), 5.3 x 4.3 cm in the segment 8 right liver lobe (series 3/image 17) and 1.8 x 1.6 cm in the caudate lobe (series 3/image 21). Additional scattered simple liver cysts, largest 2.3 cm in the posterior right liver lobe. No hepatic steatosis. Nondistended gallbladder contains numerous gallstones, largest 10 mm. No definite gallbladder wall thickening. No significant pericholecystic fluid. Mild-to-moderate diffuse intrahepatic biliary ductal dilatation. Prominent proximal common bile duct dilation up to 18 mm diameter, with abrupt CBD caliber transition at the level of  the pancreatic head.  No choledocholithiasis. No beading of the intrahepatic bile ducts. Pancreas: Poorly marginated pancreatic head mass measuring approximately 5.3 x 4.9 cm (series 3/image 39) with mixed T1 and T2 signal intensity and thick irregular peripheral enhancement with central necrosis. Dilated main pancreatic duct up to 6 mm diameter. Spleen: Normal size. No mass. Adrenals/Urinary Tract: Normal adrenals. No hydronephrosis. Normal kidneys with no renal mass. Stomach/Bowel: Moderate hiatal hernia. Otherwise normal nondistended stomach. Visualized small and large bowel is normal caliber, with no bowel wall thickening. Vascular/Lymphatic: Normal caliber abdominal aorta. Occluded portal splenic venous confluence with nonocclusive bland appearing thrombosis of the SMV (series 1103/image 60). Nonocclusive bland appearing thrombosis of main portal vein near the bifurcation with near occlusive thrombosis of the right portal vein. Patent left portal vein. Patent renal and hepatic veins and IVC. No pathologically enlarged lymph nodes in the abdomen. Other: No abdominal ascites or focal fluid collection. Musculoskeletal: No aggressive appearing focal osseous lesions. IMPRESSION: 1. Poorly marginated heterogeneously enhancing 5.3 cm pancreatic head mass compatible with known primary pancreatic malignancy. 2. 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 Mild-to-moderate diffuse intrahepatic biliary ductal dilatation. 3. 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 Near-occlusive bland appearing thrombosis of the right portal vein. 4. Widespread liver metastases. 5. Moderate hiatal hernia. Electronically Signed   By: Ilona Sorrel M.D.   On: 07/23/2019 14:09   IR INT EXT BILIARY DRAIN WITH CHOLANGIOGRAM  Result Date: 07/25/2019 INDICATION: Concern for metastatic  pancreatic cancer, now with elevated LFTs and failed attempted endoscopic biliary stent placement. As such, request made for attempted placement of an internal/external biliary drainage catheter in hopes of improving the patient's LFTs so she may be a candidate for chemotherapy. Note, preceding cross-sectional imaging is negative for significant intrahepatic biliary ductal dilatation. EXAM: ULTRASOUND AND FLUOROSCOPIC GUIDED PERCUTANEOUS TRANSHEPATIC CHOLANGIOGRAM AND BILIARY TUBE PLACEMENT COMPARISON:  CT abdomen pelvis-07/21/2019; MRCP-07/23/2019 MEDICATIONS: Zosyn 3.75 g IV; the antibiotic was administered with an appropriate frame prior to initiation of the procedure. CONTRAST:  68mL OMNIPAQUE IOHEXOL 300 MG/ML SOLN - administered into the biliary tree ANESTHESIA/SEDATION: Moderate (conscious) sedation was employed during this procedure. A total of Versed 8 mg and Fentanyl 300 mcg was administered intravenously. Moderate Sedation Time: 68 minutes. The patient's level of consciousness and vital signs were monitored continuously by radiology nursing throughout the procedure under my direct supervision. FLUOROSCOPY TIME:  22 minutes ([id number] mGy) COMPLICATIONS: None immediate. TECHNIQUE: Informed written consent was obtained from the patient after a discussion of the risks, benefits and alternatives to treatment. Questions regarding the procedure were encouraged and answered. A timeout was performed prior to the initiation of the procedure. The right upper abdominal quadrant was prepped and draped in the usual sterile fashion, and a sterile drape was applied covering the operative field. Maximum barrier sterile technique with sterile gowns and gloves were used for the procedure. A timeout was performed prior to the initiation of the procedure. Ultrasound scanning of the right upper abdominal quadrant was performed to delineate the anatomy and avoid transgression of the gallbladder or the pleural. A spot along the  right mid axillary line was marked fluoroscopically inferior to the right costophrenic angle. Sonographic evaluation was negative for significant dilatation of the intrahepatic biliary tree. Initial attempts were made to opacify a nondilated peripheral duct within the right lobe of the liver however  despite transient opacification, the duct could not be cannulated. As such, under ultrasound guidance, a 22 gauge needle was advanced towards the central aspect of the biliary tree. Contrast was injected as the needle was slowly retracted ultimately opacifying a central intrahepatic biliary duct. The duct was cannulated with a Nitrex wire which was advanced to the level of the CBD. Under fluoroscopic guidance, the access needle was exchanged for the inner 3 French catheter of the [place] set. A limited percutaneous cholangiogram was then performed. Next, a now opacified nondilated duct within the posterior aspect the right lobe of the liver was targeted with a second 22 gauge needle allowing advancement of a Nitrex wire through the cannulated peripheral bile duct to the level of the CBD. The track was dilated with the [place] set. Next, with the use of a 4 French angled glide catheter, a regular glidewire was advanced through the CBD past the distally obstructing lesion to the level of the duodenum. Contrast injection confirmed appropriate positioning. Over an Amplatz wire, the track was dilated ultimately allowing placement of an 8 French percutaneous biliary drainage catheter. Note, neither a 10 or 12 French percutaneous biliary drainage catheter was available at the time of this initial placement. Contrast was injected as several spot radiographic images were obtained in various obliquities. The biliary drainage catheter was connected to a gravity bag and secured in place with a interrupted suture and a Stat Lock device. The patient tolerated the procedure well without immediate postprocedural complication.  FINDINGS: Sonographic evaluation was negative for any significant intrahepatic biliary ductal dilatation, as was demonstrated recent cross-sectional imaging. A central aspect of the right biliary tree was ultimately opacified and cannulated allowing access to a nondilated peripheral duct within posteroinferior segment of the right lobe of the liver utilizing a 2 stick technique, ultimately allowing placement of an 8 [place] biliary drainage catheter with end coiled locked within the duodenum. Limited percutaneous cholangiogram demonstrates long segment narrowing/occlusion involving the mid and peripheral aspect of the CBD. IMPRESSION: Successful placement of an 8 French percutaneous biliary drainage catheter with end coiled and locked within the duodenum. PLAN: - Recommend obtaining daily CMP levels until a nadir of the patient's bilirubin level is obtained. - Note, the standard sized 10 French percutaneous biliary drainage catheter was not available at the of this placement (neither was a 15 [place]) and as such, an 8 [place] drainage catheter was placed. As such, this drainage catheter may be upsized at a later date as clinically indicated. - Consideration for definitive internal biliary stent placement may be considered in 4-6 weeks pending the patient's ultimate plan of care. Electronically Signed   By: Sandi Mariscal M.D.   On: 07/25/2019 15:42   IR EXCHANGE BILIARY DRAIN  Result Date: 07/28/2019 INDICATION: 64 year old female with a history of pancreatic carcinoma, biliary obstruction, hyperbilirubinemia. Concern that the prior drain has become occluded. EXAM: EXCHANGE OF EXISTING INTERNAL/EXTERNAL BILIARY DRAIN MEDICATIONS: 2 g Ancef; The antibiotic was administered within an appropriate time frame prior to the initiation of the procedure. ANESTHESIA/SEDATION: Moderate (conscious) sedation was employed during this procedure. A total of Versed 2.0 mg and Fentanyl 50 mcg was administered intravenously. Moderate  Sedation Time: 28 minutes. The patient's level of consciousness and vital signs were monitored continuously by radiology nursing throughout the procedure under my direct supervision. FLUOROSCOPY TIME:  Fluoroscopy Time: 0 minutes 36 seconds (29 mGy). COMPLICATIONS: None PROCEDURE: Informed written consent was obtained from the patient after a thorough discussion of the procedural risks, benefits  and alternatives. All questions were addressed. Maximal Sterile Barrier Technique was utilized including caps, mask, sterile gowns, sterile gloves, sterile drape, hand hygiene and skin antiseptic. A timeout was performed prior to the initiation of the procedure. Patient was positioned supine position on the fluoroscopy table. The right upper quadrant was prepped and draped in the usual sterile fashion including the indwelling drain. 1% lidocaine was used for local anesthesia at the skin entry site. Contrast was injected through the indwelling drain, with patent drain into the entry site of the right biliary system. The limb of the drain across the common bile duct appears occluded. Drain was ligated and the suture was ligated. Bentson wire was advanced through the drain into the duodenum and the drain was removed. 66 French straight sheath was advanced into the biliary system with the introducer removed. Images were acquired after gentle contrast injection. This confirmed obstruction remains at the common bile duct. We then used modified Seldinger technique to place a new 12 [place] biliary drain across the obstruction. Loop was formed in the duodenum and final contrast injection confirmed antegrade contrast flow into the duodenum. Drain was sutured in position attached to gravity drainage. Patient tolerated the procedure well and remained hemodynamically stable throughout. No complications were encountered and no significant blood loss IMPRESSION: Status post exchange of internal/external biliary drain with placement of a  new 12 French drain. Signed, Dulcy Fanny. Dellia Nims, RPVI [facility] Electronically Signed   By: Corrie Mckusick D.O.   On: 07/28/2019 17:26   IR IMAGING GUIDED PORT INSERTION  Result Date: 07/28/2019 INDICATION: 64 year old female with nonfunctional port catheter EXAM: REMOVAL OF NONFUNCTIONAL PORT CATHETER. IMPLANTED PORT A CATH PLACEMENT WITH ULTRASOUND AND FLUOROSCOPIC GUIDANCE MEDICATIONS: 2 G ANCEF; The antibiotic was administered within an appropriate time interval prior to skin puncture. ANESTHESIA/SEDATION: Moderate (conscious) sedation was employed during this procedure. A total of Versed 4.0 mg and Fentanyl 150 mcg was administered intravenously. Moderate Sedation Time: 45 minutes. The patient's level of consciousness and vital signs were monitored continuously by radiology nursing throughout the procedure under my direct supervision. FLUOROSCOPY TIME:  One minutes, 24 seconds (73 mGy) COMPLICATIONS: None PROCEDURE: Informed consent was obtained from the patient following an explanation of the procedure, risks, benefits and alternatives. The patient understands, agrees and consents for the procedure. All questions were addressed. A time out was performed prior to the initiation of the procedure. The right neck and chest was prepped with chlorhexidine, and draped in the usual sterile fashion using maximum barrier technique (cap and mask, sterile gown, sterile gloves, large sterile sheet, hand hygiene and cutaneous antiseptic). Antibiotic prophylaxis was provided with 2.0g Ancef administered IV one hour prior to skin incision. Local anesthesia was attained by infiltration with 1% lidocaine without epinephrine. The patient was positioned in the operation suite on the image intensifier table in the supine position. The right breast was taped to remove redundant tissue and attempt to recreate the position of the breast in an upright position to  decrease any redundancy in the port catheter measurement. The previous scar on the right chest was generously infiltrated with 1% lidocaine for local anesthesia. Infiltration of the skin and subcutaneous tissues surrounding the port was performed. Using sharp and blunt dissection, the port apparatus and subcutaneous catheter were removed in their entirety. The port pocket was then closed with interrupted Vicryl layer within the deep tissues, an additional intermediate layer with 2 horizontal mattress sutures using Vicryl, and then a  running subcuticular with 4-0 Monocryl. The skin was sealed with Derma bond. A sterile dressing was placed, including Steri-Strips. We then proceeded with placement of new port catheter. Ultrasound survey was performed with images stored and sent to PACs. Ultrasound demonstrated patency of the right internal jugular vein, and this was documented with an image. Under real-time ultrasound guidance, this vein was accessed with a 21 gauge micropuncture needle and image documentation was performed. A small dermatotomy was made at the access site with an 11 scalpel. A 0.018" wire was advanced into the SVC and used to estimate the length of the internal catheter. The access needle exchanged for a 96F micropuncture vascular sheath. The 0.018" wire was then removed and a 0.035" wire advanced into the IVC. An appropriate location for the subcutaneous reservoir was selected below the clavicle and an incision was made through the skin and underlying soft tissues. The subcutaneous tissues were then dissected using a combination of blunt and sharp surgical technique and a pocket was formed. A single lumen power injectable portacatheter was then tunneled through the subcutaneous tissues from the pocket to the dermatotomy and the port reservoir placed within the subcutaneous pocket. Prolene suture was then used to anchor the port within the port pocket. The venous access site was then serially dilated  and a peel away vascular sheath placed over the wire. The wire was removed and the port catheter advanced into position under fluoroscopic guidance. The catheter tip is positioned at the superior cavoatrial junction. This was documented with a spot image. The portacatheter was then tested and found to flush and aspirate well. The port was flushed with saline followed by 100 units/mL heparinized saline. The pocket was then closed in two layers using first subdermal inverted interrupted absorbable sutures followed by a running subcuticular suture. The epidermis was then sealed with Dermabond. The dermatotomy at the venous access site was also seal with Dermabond. Patient tolerated the procedure well and remained hemodynamically stable throughout. No complications encountered and no significant blood loss encountered IMPRESSION: Status post removal of nonfunctional right IJ port catheter with placement of a new right IJ port catheter. Catheter ready for use. Signed, Dulcy Fanny. Dellia Nims, RPVI [facility] Electronically Signed   By: Corrie Mckusick D.O.   On: 07/28/2019 17:21   IR IMAGING GUIDED PORT INSERTION  Result Date: 07/14/2019 CLINICAL DATA:  Pancreatic carcinoma, needs poor for planned chemotherapy regimen EXAM: TUNNELED PORT CATHETER PLACEMENT WITH ULTRASOUND AND FLUOROSCOPIC GUIDANCE FLUOROSCOPY TIME:  0.6 minute; 52  uGym2 DAP ANESTHESIA/SEDATION: Intravenous Fentanyl 59mcg and Versed 2mg  were administered as conscious sedation during continuous monitoring of the patient's level of consciousness and physiological / cardiorespiratory status by the radiology RN, with a total moderate sedation time of 21 minutes. TECHNIQUE: The procedure, risks, benefits, and alternatives were explained to the patient. Questions regarding the procedure were encouraged and answered. The patient understands and consents to the procedure. As antibiotic prophylaxis,  cefazolin 2 g was ordered pre-procedure and administered intravenously within one hour of incision. Patency of the right IJ vein was confirmed with ultrasound with image documentation. An appropriate skin site was determined. Skin site was marked. Region was prepped using maximum barrier technique including cap and mask, sterile gown, sterile gloves, large sterile sheet, and Chlorhexidine as cutaneous antisepsis. The region was infiltrated locally with 1% lidocaine. Under real-time ultrasound guidance, the right IJ vein was accessed with a 21 gauge micropuncture needle; the needle tip within the vein was  confirmed with ultrasound image documentation. Needle was exchanged over a 018 guidewire for transitional dilator, and vascular measurement was performed. A small incision was made on the right anterior chest wall and a subcutaneous pocket fashioned. The power-injectable port was positioned and its catheter tunneled to the right IJ dermatotomy site. The transitional dilator was exchanged over an Amplatz wire for a peel-away sheath, through which the port catheter, which had been trimmed to the appropriate length, was advanced and positioned under fluoroscopy with its tip at the cavoatrial junction. Spot chest radiograph confirms good catheter position and no pneumothorax. The port was flushed per protocol. The pocket was closed with deep interrupted and subcuticular continuous 3-0 Monocryl sutures. The incisions were covered with Dermabond then covered with a sterile dressing. The patient tolerated the procedure well. COMPLICATIONS: COMPLICATIONS None immediate IMPRESSION: Technically successful right IJ power-injectable port catheter placement. Ready for routine use. Electronically Signed   By: Lucrezia Europe M.D.   On: 07/14/2019 13:39    ASSESSMENT AND PLAN: 1. Metastatic pancreatic adenocarcinoma -CT of the abdomen pelvis on 07/02/2019 showed a 3.4 x 2.2 x 3  cm mass in the head of the  pancreas with multiple ill-defined  hypodense liver masses. -CT of the chest on 07/07/2019 showed no evidence of thoracic  metastasis. -Ultrasound guided liver biopsy on 07/07/2019 demonstrated  metastatic pancreatic adenocarcinoma. 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 Chronicity, now with mild microcytic anemia 4. Hypokalemia-continue to replete IV/p.o. 5. Uterine fibroids 6. Abdominal pain secondary to #1 7. Nausea 8.Hyperbilirubinemia, due to a malignant common bile duct stricture MRI/MRCP pancreas head mass, malignant common bile duct stricture at the level of the pancreas head dilatation of the proximal common bile duct intrahepatic biliary ductal dilatation leg appearing nonocclusive thrombus at the SMV and vein.  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  Numerous liver metastases 9.  Hypercalcemia  Rachel Vasquez appears unchanged.  She has persistent hyperbilirubinemia.  Biliary drain appears to be functioning well.  She has hypercalcemia on labs today. Plan for Zometa  She has persistent leukocytosis, anemia, and thrombocytopenia.  She is working with physical therapy. She agrees to a SNF. She has requested a second opinion at [facility] which we are in the process of trying to arrange.  1.  Continue PT. 2.  Recommend SNF placement after discharge for rehabilitation.  Transition of care consult was placed and FL 2 has  been sent. 3.  Zometa today for hypercalcemia. Repeat labs in am. 4.  We are attempting to arrange for telehealth visit for second opinion at [facility]. 5.  We are continuing goals of care discussion.  She declines a hospice referral. She is a poor candidate for chemotherapy due to her extremely poor performance status and hyperbilirubinemia.     LOS: 5 days   Ned Card, NP 08/06/19  Rachel Vasquez was interviewed and examined.  She appears unchanged.  She continues to have a poor limited mobility.  She will be treated for hypercalcemia.  We reviewed potential toxicities associated with Zometa including  the chance of osteonecrosis.  She agrees to proceed.  Interventional radiology has ordered a repeat CT to follow-up on the persistent elevation of the bilirubin.  Rachel Vasquez is not a candidate for chemotherapy unless her clinical status improves.  Skilled nursing facility placement is in process.  I am waiting on a reply from the [place] service regarding a telehealth consult.  I discussed the case with Rachel Vasquez sister last night.

## 2019-08-06 NOTE — TOC Progression Note (Signed)
Transition of Care [facility]) - Progression Note    Patient Details  Name: Rachel Vasquez MRN: [id number] Date of Birth: 1956-05-11  Transition of Care [facility] CM/SW Contact  Bryar Dahms, Marjie Skiff, RN Phone Number: 08/06/2019, 11:25 AM  Clinical Narrative:     Pt with only one bed offer. This CM spoke with sister [name] about bed offer. [name] consents to SNF facility applying for authorization from [employer]. [name] was informed that all facilities in the area require a 14 day quarantine period where the pt would not be allowed visitors. [name] expressed being unsure about wanting to send her sister to a facility with that policy in place. TOC will speak with [name] again when SNF liaison hears back about auth.   Expected Discharge Plan: Skilled Nursing Facility Barriers to Discharge: Continued Medical Work up  Expected Discharge Plan and Services Expected Discharge Plan: [place]   Discharge Planning Services: CM Consult   Living arrangements for the past 2 months: Apartment                                       Social Determinants of Health (SDOH) Interventions    Readmission Risk Interventions Readmission Risk Prevention Plan 08/04/2019 07/29/2019  Transportation Screening Complete Complete  Medication Review Press photographer) Complete Complete  PCP or Specialist appointment within 3-5 days of discharge Complete Complete  HRI or Home Care Consult Complete Complete  SW Recovery Care/Counseling Consult Complete Complete  Palliative Care Screening Complete Not [place] Complete Not Applicable  Some recent data might be hidden

## 2019-08-06 NOTE — Evaluation (Signed)
Occupational Therapy Evaluation Patient Details Name: Rachel Vasquez MRN: [id number] DOB: Nov 13, 1955 Today's Date: 08/06/2019    History of Present Illness 64 year old woman with recently diagnosed metastatic pancreas cancer. Recent hospitalization with biliary obstruction 07/21/19-07/29/19, s/p ERCP 07/24/19. Now readmitted for lethargy, dyspnea, weakness.   Clinical Impression   Pt was admitted for the above.  She was lethargic during the evaluation, but she did attend to task and follow most commands with frequent cues to open eyes.  Pt needs max to total A for all adls and bed mobility. Did not tolerate sitting at EOB completely today due to stomach pain.  Pt was independent prior to admission. Will follow in acute setting with the goals listed below, focusing on building strength and activity tolerance. She will likely needs snf upon d/c    Follow Up Recommendations  SNF    Equipment Recommendations  (tba further)    Recommendations for Other Services       Precautions / Restrictions Precautions Precautions: Fall Precaution Comments: biliary drain R side Restrictions Weight Bearing Restrictions: No      Mobility Bed Mobility     Rolling: Mod assist to roll from partially on side to completely on side   Supine to sit: Total assist Sit to supine: Total assist   General bed mobility comments: pt 10%  Transfers                 General transfer comment: NT    Balance       Sitting balance - Comments: attempted sitting EOB.  Assisted her to 3/4 way up, but pt could not tolerate.  Groaned and c/o stomach pain                                   ADL either performed or assessed with clinical judgement   ADL Overall ADL's : Needs assistance/impaired Eating/Feeding: Maximal assistance Eating/Feeding Details (indicate cue type and reason): poor oral intake Grooming: Maximal assistance;Total assistance Grooming Details (indicate cue type and reason):  applied lip balm with max A, hand over hand assist                               General ADL Comments: total A for all other adls, +2 for LB/toileting from a bed level     Vision         Perception     Praxis      Pertinent Vitals/Pain Pain Assessment: Faces Faces Pain Scale: Hurts even more Pain Location: stomach Pain Descriptors / Indicators: Discomfort;Sore Pain Intervention(s): Limited activity within patient's tolerance;Monitored during session;Repositioned     Hand Dominance     Extremity/Trunk Assessment Upper Extremity Assessment Upper Extremity Assessment: Generalized weakness;RUE deficits/detail;LUE deficits/detail RUE Deficits / Details: shoulder 3-/5, elbow and wrist at least 3/5, grip 4/5 LUE Deficits / Details: shoulder 2-/5, elbow and wrist 3-/5, hand 4/5           Communication Communication Communication: No difficulties   Cognition Arousal/Alertness: Lethargic                                     General Comments: pt very sleepy.  did follow commands but needed frequent prompts to open eyes/participate   General Comments       Exercises  Shoulder Instructions      Home Living Family/patient expects to be discharged to:: Skilled nursing facility Living Arrangements: Children                               Additional Comments: lives with son; sister available to stay 24/7 x 2 months      Prior Functioning/Environment Level of Independence: Independent        Comments: Retired from [employer]; independent at baseline, after recent DC from hospital pt could perform stand pivot transfer to 3 in 1 but couldn't walk        OT Problem List: Decreased activity tolerance;Decreased strength;Impaired balance (sitting and/or standing);Decreased knowledge of use of DME or AE;Pain;Impaired UE functional use;Decreased cognition      OT Treatment/Interventions: Self-care/ADL training;Energy  conservation;DME and/or AE instruction;Patient/family education;Balance training;Therapeutic activities;Cognitive remediation/compensation    OT Goals(Current goals can be found in the care plan section) Acute Rehab OT Goals Patient Stated Goal: none stated OT Goal Formulation: With patient/family Time For Goal Achievement: 08/20/19 Potential to Achieve Goals: Fair ADL Goals Additional ADL Goal #1: pt will perform grooming and UB adls with mod A Additional ADL Goal #2: Pt will perform 15 min of UE exercises A/AAROM with 3 rest breaks  to increase strength/endurance for adls Additional ADL Goal #3: pt will participate in bed mobility (supine<>sit) and sit<>stand at mod A level for adls  OT Frequency: Min 2X/week   Barriers to D/C:            Co-evaluation              AM-PAC OT "6 Clicks" Daily Activity     Outcome Measure Help from another person eating meals?: A Lot Help from another person taking care of personal grooming?: A Lot Help from another person toileting, which includes using toliet, bedpan, or urinal?: Total Help from another person bathing (including washing, rinsing, drying)?: Total Help from another person to put on and taking off regular upper body clothing?: Total Help from another person to put on and taking off regular lower body clothing?: Total 6 Click Score: 8   End of Session    Activity Tolerance: Patient limited by fatigue Patient left: in bed;with call bell/phone within reach;with bed alarm set;with family/visitor present  OT Visit Diagnosis: Muscle weakness (generalized) (M62.81)                Time: [id number] OT Time Calculation (min): 20 min Charges:  OT General Charges $OT Visit: 1 Visit OT Evaluation $OT Eval Moderate Complexity: 1 Mod  Bentleigh Waren S, OTR/L Acute Rehabilitation Services 08/06/2019  Sesilia Poucher 08/06/2019, 3:46 PM

## 2019-08-06 NOTE — Progress Notes (Signed)
Referring Physician(s): Sherrill,B  Supervising Physician: Daryll Brod  Patient Status:  [facility] - In-pt  Chief Complaint:  Pancreatic cancer, status post biliary drain placement  Subjective: Patient remains somewhat lethargic.  She does have intermittent back pain and some soreness at the biliary drain site.   Allergies: Patient has no known allergies.  Medications: Prior to Admission medications   Medication Sig Start Date End Date Taking? Authorizing Provider  amLODipine (NORVASC) 10 MG tablet Take 1 tablet (10 mg total) by mouth daily. 07/18/19 09/16/19 Yes Antonieta Pert, MD  lidocaine-prilocaine (EMLA) cream Apply 1 application topically as needed. Apply 1 hour prior to stick and cover with plastic wrap 07/16/19  Yes Ladell Pier, MD  LORazepam (ATIVAN) 0.5 MG tablet Take 1 tablet (0.5 mg total) by mouth every 6 (six) hours as needed for up to 15 doses (nausea/vomiting. May give PO or SL.). 07/17/19  Yes Kc, Maren Beach, MD  metoCLOPramide (REGLAN) 5 MG tablet Take 1 tablet (5 mg total) by mouth 3 (three) times daily before meals. 07/17/19 08/16/19 Yes Antonieta Pert, MD  Multiple Vitamins-Minerals (MULTIVITAMIN WITH MINERALS) tablet Take 1 tablet by mouth daily.   Yes [provider]  ondansetron (ZOFRAN-ODT) 4 MG disintegrating tablet Take 1 tablet (4 mg total) by mouth every 8 (eight) hours as needed for up to 30 doses for nausea or vomiting. 07/17/19  Yes Antonieta Pert, MD  oxyCODONE (OXY IR/ROXICODONE) 5 MG immediate release tablet Take 1 tablet (5 mg total) by mouth every 4 (four) hours as needed for up to 15 doses for moderate pain. 07/17/19  Yes Antonieta Pert, MD  pantoprazole (PROTONIX) 40 MG tablet Take 1 tablet (40 mg total) by mouth daily. 07/17/19 08/16/19 Yes Antonieta Pert, MD  polyethylene glycol (MIRALAX / GLYCOLAX) 17 g packet Take 17 g by mouth daily as needed for mild constipation. 07/17/19  Yes Antonieta Pert, MD  predniSONE (DELTASONE) 10 MG tablet Take 1 tablet (10 mg total) by  mouth daily with breakfast. 07/18/19 08/17/19 Yes Kc, Maren Beach, MD  prochlorperazine (COMPAZINE) 10 MG tablet Take 1 tablet (10 mg total) by mouth every 6 (six) hours as needed for nausea. 07/16/19  Yes Ladell Pier, MD  loperamide (IMODIUM) 2 MG capsule Take 1 capsule (2 mg total) by mouth as needed for diarrhea or loose stools. 07/29/19   Maryanna Shape, NP     Vital Signs: BP 134/72 (BP Location: Right Arm)   Pulse 95   Temp 98 F (36.7 C) (Oral)   Resp 17   Ht 5\' 7"  (1.702 m)   Wt 241 lb 1.6 oz (109.4 kg)   SpO2 99%   BMI 37.76 kg/m   Physical Exam patient lethargic but arouses to stimulus; biliary drain intact, insertion site okay, mildly tender to palpation, output 3 L yesterday, 400 cc today turbid,golden bile; drain flushed without difficulty; new StatLock device placed on drain   Imaging: No results found.  Labs:  CBC: Recent Labs    08/03/19 0854 08/04/19 0500 08/05/19 0535 08/06/19 0500  WBC 16.8* 19.3* 20.9* 23.5*  HGB 8.3* 8.8* 8.5* 9.0*  HCT 26.7* 27.9* 27.2* 29.6*  PLT 93* 95* 93* 91*    COAGS: Recent Labs    07/07/19 0050 07/14/19 0621 07/25/19 0731  INR 1.2 1.3* 2.2*    BMP: Recent Labs    08/03/19 0854 08/04/19 0500 08/05/19 0535 08/06/19 0500  NA 138 139 138 137  K 3.0* 3.2* 3.4* 3.8  CL 103 105 106 104  CO2 26 25 25 25   GLUCOSE 196* 213* 258* 184*  BUN 18 16 16 15   CALCIUM 9.3 9.6 9.9 10.7*  CREATININE 0.55 0.43* 0.56 0.43*  GFRNONAA >60 >60 >60 >60  GFRAA >60 >60 >60 >60    LIVER FUNCTION TESTS: Recent Labs    08/03/19 0854 08/04/19 0500 08/05/19 0535 08/06/19 0500  BILITOT 11.1* 11.1* 10.7* 11.1*  AST 63* 57* 58* 69*  ALT 92* 87* 85* 94*  ALKPHOS 201* 188* 172* 182*  PROT 5.6* 5.8* 5.6* 5.7*  ALBUMIN 1.5* 1.5* 1.4* 1.4*    Assessment and Plan: Patient with history of metastatic pancreatic cancer/ biliary obstruction with failed attempted at endoscopic biliary stent placement, status post internal/external  biliary drain placement on 07/25/2019 and drain exchange on 1/25; also status post removal of a nonfunctional right IJ port with placement of new right IJ port on 07/28/2019; afebrile, WBC 23.5 up from 20.9, hemoglobin 9, up from 8.5, platelets 91k, creatinine 0.43, total bilirubin 11.1 up slightly from 10.7; cultures with multiple organisms present none predominant; COVID-19 negative yesterday; will check follow-up CT abdomen/ pelvis tomorrow in light of rising WBC and bilirubin   Electronically Signed: D. Rowe Robert, PA-C 08/06/2019, 10:36 AM   I spent a total of 15 minutes at the the patient's bedside AND on the patient's hospital floor or unit, greater than 50% of which was counseling/coordinating care for biliary drain    Patient ID: Rachel Vasquez, female   DOB: Feb 16, 1956, 64 y.o.   MRN: [id number]

## 2019-08-07 DIAGNOSIS — R509 Fever, unspecified: Secondary | ICD-10-CM

## 2019-08-07 DIAGNOSIS — R0682 Tachypnea, not elsewhere classified: Secondary | ICD-10-CM

## 2019-08-07 DIAGNOSIS — D259 Leiomyoma of uterus, unspecified: Secondary | ICD-10-CM

## 2019-08-07 DIAGNOSIS — A419 Sepsis, unspecified organism: Secondary | ICD-10-CM

## 2019-08-07 DIAGNOSIS — I82890 Acute embolism and thrombosis of other specified veins: Secondary | ICD-10-CM

## 2019-08-07 DIAGNOSIS — R000 Tachycardia, unspecified: Secondary | ICD-10-CM

## 2019-08-07 LAB — CBC WITH DIFFERENTIAL/PLATELET
Abs Immature Granulocytes: 0.68 10*3/uL — ABNORMAL HIGH (ref 0.00–0.07)
Basophils Absolute: 0.1 10*3/uL (ref 0.0–0.1)
Basophils Relative: 0 %
Eosinophils Absolute: 0.1 10*3/uL (ref 0.0–0.5)
Eosinophils Relative: 0 %
HCT: 31.6 % — ABNORMAL LOW (ref 36.0–46.0)
Hemoglobin: 9.9 g/dL — ABNORMAL LOW (ref 12.0–15.0)
Immature Granulocytes: 3 %
Lymphocytes Relative: 4 %
Lymphs Abs: 1 10*3/uL (ref 0.7–4.0)
MCH: 25.8 pg — ABNORMAL LOW (ref 26.0–34.0)
MCHC: 31.3 g/dL (ref 30.0–36.0)
MCV: 82.3 fL (ref 80.0–100.0)
Monocytes Absolute: 0.6 10*3/uL (ref 0.1–1.0)
Monocytes Relative: 2 %
Neutro Abs: 23.1 10*3/uL — ABNORMAL HIGH (ref 1.7–7.7)
Neutrophils Relative %: 91 %
Platelets: 99 10*3/uL — ABNORMAL LOW (ref 150–400)
RBC: 3.84 MIL/uL — ABNORMAL LOW (ref 3.87–5.11)
RDW: 26.5 % — ABNORMAL HIGH (ref 11.5–15.5)
WBC: 25.5 10*3/uL — ABNORMAL HIGH (ref 4.0–10.5)
nRBC: 1.3 % — ABNORMAL HIGH (ref 0.0–0.2)

## 2019-08-07 LAB — GLUCOSE, CAPILLARY
Glucose-Capillary: 130 mg/dL — ABNORMAL HIGH (ref 70–99)
Glucose-Capillary: 156 mg/dL — ABNORMAL HIGH (ref 70–99)
Glucose-Capillary: 162 mg/dL — ABNORMAL HIGH (ref 70–99)
Glucose-Capillary: 166 mg/dL — ABNORMAL HIGH (ref 70–99)
Glucose-Capillary: 169 mg/dL — ABNORMAL HIGH (ref 70–99)

## 2019-08-07 LAB — COMPREHENSIVE METABOLIC PANEL
ALT: 101 U/L — ABNORMAL HIGH (ref 0–44)
AST: 79 U/L — ABNORMAL HIGH (ref 15–41)
Albumin: 1.5 g/dL — ABNORMAL LOW (ref 3.5–5.0)
Alkaline Phosphatase: 164 U/L — ABNORMAL HIGH (ref 38–126)
Anion gap: 10 (ref 5–15)
BUN: 29 mg/dL — ABNORMAL HIGH (ref 8–23)
CO2: 23 mmol/L (ref 22–32)
Calcium: 10.3 mg/dL (ref 8.9–10.3)
Chloride: 102 mmol/L (ref 98–111)
Creatinine, Ser: 1.35 mg/dL — ABNORMAL HIGH (ref 0.44–1.00)
GFR calc Af Amer: 48 mL/min — ABNORMAL LOW (ref >60)
GFR calc non Af Amer: 41 mL/min — ABNORMAL LOW (ref >60)
Glucose, Bld: 174 mg/dL — ABNORMAL HIGH (ref 70–99)
Potassium: 4.7 mmol/L (ref 3.5–5.1)
Sodium: 135 mmol/L (ref 135–145)
Total Bilirubin: 11.7 mg/dL — ABNORMAL HIGH (ref 0.3–1.2)
Total Protein: 6 g/dL — ABNORMAL LOW (ref 6.5–8.1)

## 2019-08-07 MED ORDER — POTASSIUM CHLORIDE 20 MEQ PO PACK
20.0000 meq | PACK | Freq: Every day | ORAL | Status: DC
  Filled 2019-08-07 (×3): qty 1

## 2019-08-07 MED ORDER — PIPERACILLIN-TAZOBACTAM 3.375 G IVPB
3.3750 g | Freq: Three times a day (TID) | INTRAVENOUS | Status: DC
  Administered 2019-08-07 – 2019-08-09 (×7): 3.375 g via INTRAVENOUS
  Filled 2019-08-07 (×9): qty 50

## 2019-08-07 MED ORDER — SODIUM CHLORIDE 0.9 % IV SOLN: INTRAVENOUS | Status: DC

## 2019-08-07 NOTE — Progress Notes (Signed)
   Vital Signs MEWS/VS Documentation      08/07/2019 1135 08/07/2019 1237 08/07/2019 1331 08/07/2019 1825   MEWS Score:  4  4  4  4    MEWS Score Color:  Red  Red  Red  Red   Resp:  (!) 40  (!) 41  (!) 39  (!) 40   Pulse:  (!) 103  97  100  100   BP:  123/80  122/79  128/75  115/74   Temp:  (!) 97.5 F (36.4 C)  98.2 F (36.8 C)  98.3 F (36.8 C)  98.6 F (37 C)   O2 Device:  Room Air  Room Air  Room Air  Room Air      Patient continues to have tachypnea and tachycardia, MD is aware. Patient also has very little urine output.      Norva Pavlov 08/07/2019,7:45 PM

## 2019-08-07 NOTE — Progress Notes (Signed)
   Vital Signs MEWS/VS Documentation      08/06/2019 2354 08/07/2019 0653 08/07/2019 0745 08/07/2019 0800   MEWS Score:  1  1  4  4    MEWS Score Color:  Green  Green  Red  Red   Resp:  18  19  (!) 40  (!) 36   Pulse:  (!) 105  (!) 106  (!) 109  --   BP:  111/64  106/69  114/74  --   Temp:  98.5 F (36.9 C)  98.4 F (36.9 C)  98.5 F (36.9 C)  --   O2 Device:  Room Air  Room [employer]  --     Dr Benay Spice at bedside when VS were taken. He is aware of HR and respirations. He is contacting patients sister concerning patients status and Code status.      Norva Pavlov 08/07/2019,8:17 AM

## 2019-08-07 NOTE — Progress Notes (Signed)
[facility]) Hospital Liaison Note  Received request from [place] to reach out to family of patient to initiate education about residential hospice. Spoke to patient sister [name] to explain Hospice services, Hospice philosophy and approach to comfort care. Also made sister aware of Covid 19 pandemic visitation policies at residential hospices. Answered questions and supported sister who stated that family is not ready to make a decision at this time regarding hospice.  Patient sister given [facility] contact information during call and encouraged to call with questions.  An [place] member will reach out to patient sister tomorrow per her request for further discussion.  Please call with any hospice related questions,  Gar Ponto, RN [facility] ([facility]) [phone]

## 2019-08-07 NOTE — TOC Transition Note (Signed)
Transition of Care [facility]) - CM/SW Discharge Note   Patient Details  Name: Rachel Vasquez MRN: [id number] Date of Birth: December 22, 1955  [facility] CM/SW Contact:  Lynnell Catalan, RN Phone Number: 08/07/2019, 4:06 PM   Clinical Narrative:    [facility] consult for hospice/[facility]. This CM contacted [facility] liaison to speak with family about hospice/[facility]). TOC will continue to follow and assist with DC planning   Readmission Risk Interventions Readmission Risk Prevention Plan 08/04/2019 07/29/2019  Transportation Screening Complete Complete  Medication Review Press photographer) Complete Complete  PCP or Specialist appointment within 3-5 days of discharge Complete Complete  HRI or Home Care Consult Complete Complete  SW Recovery Care/Counseling Consult Complete Complete  Palliative Care Screening Complete Not [place] Complete Not Applicable  Some recent data might be hidden

## 2019-08-07 NOTE — Progress Notes (Addendum)
HEMATOLOGY-ONCOLOGY PROGRESS NOTE  SUBJECTIVE: More somnolent this morning.  Has a low-grade fever of 99 5 this morning.  Noted to be tachypneic and tachycardic.  She is alert and able to answer some questions.  She denies pain this morning.  She is not having any nausea or vomiting.  Oncology History  Pancreatic adenocarcinoma ([place])  07/10/2019 Initial Diagnosis   Pancreatic adenocarcinoma ([place])   07/21/2019 -  Chemotherapy   The patient had PACLitaxel-protein bound (ABRAXANE) chemo infusion 225 mg, 100 mg/m2 = 225 mg (original dose ), Intravenous,  Once, 0 of 4 cycles Dose modification: 100 mg/m2 (Cycle 1, Reason: Provider Judgment) gemcitabine (GEMZAR) 2,280 mg in sodium chloride 0.9 % 250 mL chemo infusion, 1,000 mg/m2 = 2,280 mg, Intravenous,  Once, 0 of 4 cycles  for chemotherapy treatment.     PHYSICAL EXAMINATION:  Vitals:   08/07/19 0800 08/07/19 0842  BP:  108/67  Pulse:  (!) 106  Resp: (!) 36 (!) 40  Temp:  97.6 F (36.4 C)  SpO2:  97%   Filed Weights   08/01/19 1458  Weight: 241 lb 1.6 oz (109.4 kg)    Intake/Output from previous day: 02/03 0701 - 02/04 0700 In: -  Out: 500 [Urine:400; Drains:100]  GENERAL: Lethargic, arouses to verbal stimuli. EYES: Scleral icterus  OROPHARYNX: Dry mucus membranes. No thrush or mucositis LUNGS: clear to auscultation and percussion with normal breathing effort HEART: regular rate & rhythm, pitting edema in the lower extremities ABDOMEN:abdomen soft, no hepatomegaly, tender right abdomen. Right abdomen drain site with small amount of drainage. NEURO: lethargic, arouses to verbal stimuli  Port-A-Cath without erythema  LABORATORY DATA:  I have reviewed the data as listed CMP Latest Ref Rng & Units 08/07/2019 08/06/2019 08/05/2019  Glucose 70 - 99 mg/dL 174(H) 184(H) 258(H)  BUN 8 - 23 mg/dL 29(H) 15 16  Creatinine 0.44 - 1.00 mg/dL 1.35(H) 0.43(L) 0.56  Sodium 135 - 145 mmol/L 135 137 138  Potassium 3.5 - 5.1 mmol/L 4.7 3.8  3.4(L)  Chloride 98 - 111 mmol/L 102 104 106  CO2 22 - 32 mmol/L 23 25 25   Calcium 8.9 - 10.3 mg/dL 10.3 10.7(H) 9.9  Total Protein 6.5 - 8.1 g/dL 6.0(L) 5.7(L) 5.6(L)  Total Bilirubin 0.3 - 1.2 mg/dL 11.7(H) 11.1(H) 10.7(H)  Alkaline Phos 38 - 126 U/L 164(H) 182(H) 172(H)  AST 15 - 41 U/L 79(H) 69(H) 58(H)  ALT 0 - 44 U/L 101(H) 94(H) 85(H)    Lab Results  Component Value Date   WBC 25.5 (H) 08/07/2019   HGB 9.9 (L) 08/07/2019   HCT 31.6 (L) 08/07/2019   MCV 82.3 08/07/2019   PLT 99 (L) 08/07/2019   NEUTROABS 23.1 (H) 08/07/2019    X-ray chest PA and lateral  Result Date: 08/01/2019 CLINICAL DATA:  Shortness of breath. EXAM: CHEST - 2 VIEW COMPARISON:  None. FINDINGS: A right Port-A-Cath terminates in the central SVC. Bibasilar opacities have platelike components. No pneumothorax. The cardiomediastinal silhouette is stable. No other acute abnormalities. IMPRESSION: Bibasilar platelike opacities likely represent atelectasis. Underlying infiltrates not excluded. Electronically Signed   By: Dorise Bullion III M.D   On: 08/01/2019 19:20   CT ABDOMEN PELVIS W CONTRAST  Result Date: 07/21/2019 CLINICAL DATA:  History of recently diagnosed metastatic pancreatic cancer. EXAM: CT ABDOMEN AND PELVIS WITH CONTRAST TECHNIQUE: Multidetector CT imaging of the abdomen and pelvis was performed using the standard protocol following bolus administration of intravenous contrast. CONTRAST:  173m OMNIPAQUE IOHEXOL 300 MG/ML  SOLN COMPARISON:  July 02, 2019 FINDINGS: Lower chest: Mild atelectasis is seen within the anterior aspect of the bilateral lung bases. Hepatobiliary: Numerous heterogeneous low-attenuation liver lesions are seen. These are mildly increased in size when compared to the prior study. Ill-defined gallstones are seen within a contracted gallbladder. Pancreas: A 5.6 cm x 6.3 cm x 5.4 cm complex, partially cystic mass is seen involving the body and head of the pancreas Spleen: Normal in  size without focal abnormality. Adrenals/Urinary Tract: Adrenal glands are unremarkable. Kidneys are normal, without renal calculi, focal lesion, or hydronephrosis. Bladder is unremarkable. Stomach/Bowel: There is a large gastric hernia. Appendix appears normal. No evidence of bowel wall thickening, distention, or inflammatory changes. Vascular/Lymphatic: Reproductive: A 12.8 cm x 8.6 cm heterogeneous mass is seen extending from the anterolateral aspect of the uterine fundus on the left. This contains soft tissue and fat density components. Additional 6.8 cm x 6.9 cm, 5.0 cm diameter and 3.7 cm diameter homogeneous uterine mass is seen. Other: Small fat containing ventral hernias are seen along the midline of the lower abdomen. Musculoskeletal: No acute or significant osseous findings. IMPRESSION: 1. 5.6 cm x 6.3 cm x 5.4 cm complex pancreatic mass which is increased in size when compared to the prior study dated July 02, 2019. 2. Numerous heterogeneous liver lesions consistent with hepatic metastasis. 3. Cholelithiasis. 4. Large gastric hernia. 5. Multiple large uterine masses which may represent large uterine fibroids. Electronically Signed   By: Virgina Norfolk M.D.   On: 07/21/2019 21:34   MR 3D Recon At Scanner  Result Date: 07/23/2019 CLINICAL DATA:  Inpatient. Pancreatic cancer with biopsy-proven liver metastases based on 07/07/2019 liver mass biopsy, now with hyperbilirubinemia. Chemotherapy initiated 07/21/2019. EXAM: MRI ABDOMEN WITHOUT AND WITH CONTRAST (INCLUDING MRCP) TECHNIQUE: Multiplanar multisequence MR imaging of the abdomen was performed both before and after the administration of intravenous contrast. Heavily T2-weighted images of the biliary and pancreatic ducts were obtained, and three-dimensional MRCP images were rendered by post processing. CONTRAST:  44m GADAVIST GADOBUTROL 1 MMOL/ML IV SOLN COMPARISON:  07/21/2019 CT abdomen/pelvis. FINDINGS: Lower chest: No acute abnormality at  the lung bases. Hepatobiliary: There are numerous (greater than 15) similar liver masses scattered throughout the liver each demonstrating mild T2 hyperintensity, restricted diffusion and heterogeneous enhancement, compatible with liver metastases. Representative liver masses measure 5.1 x 3.8 cm in the segment 2 left liver lobe (series 3/image 19), 5.3 x 4.3 cm in the segment 8 right liver lobe (series 3/image 17) and 1.8 x 1.6 cm in the caudate lobe (series 3/image 21). Additional scattered simple liver cysts, largest 2.3 cm in the posterior right liver lobe. No hepatic steatosis. Nondistended gallbladder contains numerous gallstones, largest 10 mm. No definite gallbladder wall thickening. No significant pericholecystic fluid. Mild-to-moderate diffuse intrahepatic biliary ductal dilatation. Prominent proximal common bile duct dilation up to 18 mm diameter, with abrupt CBD caliber transition at the level of the pancreatic head. No choledocholithiasis. No beading of the intrahepatic bile ducts. Pancreas: Poorly marginated pancreatic head mass measuring approximately 5.3 x 4.9 cm (series 3/image 39) with mixed T1 and T2 signal intensity and thick irregular peripheral enhancement with central necrosis. Dilated main pancreatic duct up to 6 mm diameter. Spleen: Normal size. No mass. Adrenals/Urinary Tract: Normal adrenals. No hydronephrosis. Normal kidneys with no renal mass. Stomach/Bowel: Moderate hiatal hernia. Otherwise normal nondistended stomach. Visualized small and large bowel is normal caliber, with no bowel wall thickening. Vascular/Lymphatic: Normal caliber abdominal aorta. Occluded portal splenic venous confluence with nonocclusive bland appearing thrombosis  of the SMV (series 1103/image 60). Nonocclusive bland appearing thrombosis of main portal vein near the bifurcation with near occlusive thrombosis of the right portal vein. Patent left portal vein. Patent renal and hepatic veins and IVC. No  pathologically enlarged lymph nodes in the abdomen. Other: No abdominal ascites or focal fluid collection. Musculoskeletal: No aggressive appearing focal osseous lesions. IMPRESSION: 1. Poorly marginated heterogeneously enhancing 5.3 cm pancreatic head mass compatible with known primary pancreatic malignancy. 2. 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 Mild-to-moderate diffuse intrahepatic biliary ductal dilatation. 3. 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 Near-occlusive bland appearing thrombosis of the right portal vein. 4. Widespread liver metastases. 5. Moderate hiatal hernia. Electronically Signed   By: Ilona Sorrel M.D.   On: 07/23/2019 14:09   IR REMOVAL TUN ACCESS W/ PORT W/O FL MOD SED  Result Date: 07/28/2019 INDICATION: 64 year old female with nonfunctional port catheter EXAM: REMOVAL OF NONFUNCTIONAL PORT CATHETER. IMPLANTED PORT A CATH PLACEMENT WITH ULTRASOUND AND FLUOROSCOPIC GUIDANCE MEDICATIONS: 2 G ANCEF; The antibiotic was administered within an appropriate time interval prior to skin puncture. ANESTHESIA/SEDATION: Moderate (conscious) sedation was employed during this procedure. A total of Versed 4.0 mg and Fentanyl 150 mcg was administered intravenously. Moderate Sedation Time: 45 minutes. The patient's level of consciousness and vital signs were monitored continuously by radiology nursing throughout the procedure under my direct supervision. FLUOROSCOPY TIME:  One minutes, 24 seconds (73 mGy) COMPLICATIONS: None PROCEDURE: Informed consent was obtained from the patient following an explanation of the procedure, risks, benefits and alternatives. The patient understands, agrees and consents for the procedure. All questions were addressed. A time out was performed prior to the initiation of the procedure. The  right neck and chest was prepped with chlorhexidine, and draped in the usual sterile fashion using maximum barrier technique (cap and mask, sterile gown, sterile gloves, large sterile sheet, hand hygiene and cutaneous antiseptic). Antibiotic prophylaxis was provided with 2.0g Ancef administered IV one hour prior to skin incision. Local anesthesia was attained by infiltration with 1% lidocaine without epinephrine. The patient was positioned in the operation suite on the image intensifier table in the supine position. The right breast was taped to remove redundant tissue and attempt to recreate the position of the breast in an upright position to decrease any redundancy in the port catheter measurement. The previous scar on the right chest was generously infiltrated with 1% lidocaine for local anesthesia. Infiltration of the skin and subcutaneous tissues surrounding the port was performed. Using sharp and blunt dissection, the port apparatus and subcutaneous catheter were removed in their entirety. The port pocket was then closed with interrupted Vicryl layer within the deep tissues, an additional intermediate layer with 2 horizontal mattress sutures using Vicryl, and then a running subcuticular with 4-0 Monocryl. The skin was sealed with Derma bond. A sterile dressing was placed, including Steri-Strips. We then proceeded with placement of new port catheter. Ultrasound survey was performed with images stored and sent to PACs. Ultrasound demonstrated patency of the right internal jugular vein, and this was documented with an image. Under real-time ultrasound guidance, this vein was accessed with a 21 gauge micropuncture needle and image documentation was performed. A small dermatotomy was made at the access site with an 11 scalpel. A 0.018" wire was advanced into the SVC and used to estimate the length of the  internal catheter. The access needle exchanged for a 15F micropuncture vascular sheath. The 0.018" wire was then  removed and a 0.035" wire advanced into the IVC. An appropriate location for the subcutaneous reservoir was selected below the clavicle and an incision was made through the skin and underlying soft tissues. The subcutaneous tissues were then dissected using a combination of blunt and sharp surgical technique and a pocket was formed. A single lumen power injectable portacatheter was then tunneled through the subcutaneous tissues from the pocket to the dermatotomy and the port reservoir placed within the subcutaneous pocket. Prolene suture was then used to anchor the port within the port pocket. The venous access site was then serially dilated and a peel away vascular sheath placed over the wire. The wire was removed and the port catheter advanced into position under fluoroscopic guidance. The catheter tip is positioned at the superior cavoatrial junction. This was documented with a spot image. The portacatheter was then tested and found to flush and aspirate well. The port was flushed with saline followed by 100 units/mL heparinized saline. The pocket was then closed in two layers using first subdermal inverted interrupted absorbable sutures followed by a running subcuticular suture. The epidermis was then sealed with Dermabond. The dermatotomy at the venous access site was also seal with Dermabond. Patient tolerated the procedure well and remained hemodynamically stable throughout. No complications encountered and no significant blood loss encountered IMPRESSION: Status post removal of nonfunctional right IJ port catheter with placement of a new right IJ port catheter. Catheter ready for use. Signed, Dulcy Fanny. Dellia Nims, RPVI [facility] Electronically Signed   By: Corrie Mckusick D.O.   On: 07/28/2019 17:21   DG CHEST PORT 1 VIEW  Result Date: 07/27/2019 CLINICAL DATA:  Port-A-Cath placement. EXAM: PORTABLE CHEST 1 VIEW COMPARISON:  None. FINDINGS: A  right-sided venous Port-A-Cath is seen with its distal tip overlying the right apex. This is within the region overlying the proximal portion of the right clavicle. Mild linear atelectasis is seen within the right lung base. There is a small left pleural effusion. No pneumothorax is identified. The cardiac silhouette is moderately enlarged. The visualized skeletal structures are unremarkable. IMPRESSION: 1. Right-sided venous Port-A-Cath positioning, as described above. 2. Mild right basilar atelectasis. 3. Small left pleural effusion. Electronically Signed   By: Virgina Norfolk M.D.   On: 07/27/2019 22:43   DG Abd 2 Views  Result Date: 07/08/2019 CLINICAL DATA:  Abdominal pain and distension x3 days. EXAM: ABDOMEN - 2 VIEW COMPARISON:  None. FINDINGS: The bowel gas pattern is normal. A moderate to marked amount of stool is seen within the ascending colon. There is no evidence of free air. No radio-opaque calculi or other significant radiographic abnormality is seen. A radiopaque tubal ligation clip is seen within the pelvis on the right. An additional tubal ligation clip is seen overlying the superior aspect of the sacrum on the left. A 4 mm focal opacity is seen overlying the symphysis pubis on the left. IMPRESSION: 1. Normal bowel gas pattern. No evidence of free air. Electronically Signed   By: Virgina Norfolk M.D.   On: 07/08/2019 16:27   DG Abd Portable 1V  Result Date: 07/10/2019 CLINICAL DATA:  Abdominal pain EXAM: PORTABLE ABDOMEN - 1 VIEW COMPARISON:  07/08/2019 FINDINGS: Bowel gas pattern remains unremarkable. Stool burden is overall mild with greater burden within the ascending colon. Tubal ligation clips noted. IMPRESSION: Normal bowel gas pattern. Electronically Signed  By: Macy Mis M.D.   On: 07/10/2019 13:20   DG C-Arm 1-60 Min-No Report  Result Date: 07/24/2019 Fluoroscopy was utilized by the requesting physician.  No radiographic interpretation.   MR ABDOMEN MRCP W WO  CONTAST  Result Date: 07/23/2019 CLINICAL DATA:  Inpatient. Pancreatic cancer with biopsy-proven liver metastases based on 07/07/2019 liver mass biopsy, now with hyperbilirubinemia. Chemotherapy initiated 07/21/2019. EXAM: MRI ABDOMEN WITHOUT AND WITH CONTRAST (INCLUDING MRCP) TECHNIQUE: Multiplanar multisequence MR imaging of the abdomen was performed both before and after the administration of intravenous contrast. Heavily T2-weighted images of the biliary and pancreatic ducts were obtained, and three-dimensional MRCP images were rendered by post processing. CONTRAST:  58m GADAVIST GADOBUTROL 1 MMOL/ML IV SOLN COMPARISON:  07/21/2019 CT abdomen/pelvis. FINDINGS: Lower chest: No acute abnormality at the lung bases. Hepatobiliary: There are numerous (greater than 15) similar liver masses scattered throughout the liver each demonstrating mild T2 hyperintensity, restricted diffusion and heterogeneous enhancement, compatible with liver metastases. Representative liver masses measure 5.1 x 3.8 cm in the segment 2 left liver lobe (series 3/image 19), 5.3 x 4.3 cm in the segment 8 right liver lobe (series 3/image 17) and 1.8 x 1.6 cm in the caudate lobe (series 3/image 21). Additional scattered simple liver cysts, largest 2.3 cm in the posterior right liver lobe. No hepatic steatosis. Nondistended gallbladder contains numerous gallstones, largest 10 mm. No definite gallbladder wall thickening. No significant pericholecystic fluid. Mild-to-moderate diffuse intrahepatic biliary ductal dilatation. Prominent proximal common bile duct dilation up to 18 mm diameter, with abrupt CBD caliber transition at the level of the pancreatic head. No choledocholithiasis. No beading of the intrahepatic bile ducts. Pancreas: Poorly marginated pancreatic head mass measuring approximately 5.3 x 4.9 cm (series 3/image 39) with mixed T1 and T2 signal intensity and thick irregular peripheral enhancement with central necrosis. Dilated main  pancreatic duct up to 6 mm diameter. Spleen: Normal size. No mass. Adrenals/Urinary Tract: Normal adrenals. No hydronephrosis. Normal kidneys with no renal mass. Stomach/Bowel: Moderate hiatal hernia. Otherwise normal nondistended stomach. Visualized small and large bowel is normal caliber, with no bowel wall thickening. Vascular/Lymphatic: Normal caliber abdominal aorta. Occluded portal splenic venous confluence with nonocclusive bland appearing thrombosis of the SMV (series 1103/image 60). Nonocclusive bland appearing thrombosis of main portal vein near the bifurcation with near occlusive thrombosis of the right portal vein. Patent left portal vein. Patent renal and hepatic veins and IVC. No pathologically enlarged lymph nodes in the abdomen. Other: No abdominal ascites or focal fluid collection. Musculoskeletal: No aggressive appearing focal osseous lesions. IMPRESSION: 1. Poorly marginated heterogeneously enhancing 5.3 cm pancreatic head mass compatible with known primary pancreatic malignancy. 2. 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 Mild-to-moderate diffuse intrahepatic biliary ductal dilatation. 3. 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 Near-occlusive bland appearing thrombosis of the right portal vein. 4. Widespread liver metastases. 5. Moderate hiatal hernia. Electronically Signed   By: JIlona SorrelM.D.   On: 07/23/2019 14:09   IR INT EXT BILIARY DRAIN WITH CHOLANGIOGRAM  Result Date: 07/25/2019 INDICATION: Concern for metastatic pancreatic cancer, now with elevated LFTs and failed attempted endoscopic biliary stent placement. As such, request made for attempted placement of an internal/external biliary drainage catheter in hopes of improving the patient's LFTs so she may be a candidate for chemotherapy. Note,  preceding cross-sectional imaging is negative for significant intrahepatic  biliary ductal dilatation. EXAM: ULTRASOUND AND FLUOROSCOPIC GUIDED PERCUTANEOUS TRANSHEPATIC CHOLANGIOGRAM AND BILIARY TUBE PLACEMENT COMPARISON:  CT abdomen pelvis-07/21/2019; MRCP-07/23/2019 MEDICATIONS: Zosyn 3.75 g IV; the antibiotic was administered with an appropriate frame prior to initiation of the procedure. CONTRAST:  60m OMNIPAQUE IOHEXOL 300 MG/ML SOLN - administered into the biliary tree ANESTHESIA/SEDATION: Moderate (conscious) sedation was employed during this procedure. A total of Versed 8 mg and Fentanyl 300 mcg was administered intravenously. Moderate Sedation Time: 68 minutes. The patient's level of consciousness and vital signs were monitored continuously by radiology nursing throughout the procedure under my direct supervision. FLUOROSCOPY TIME:  22 minutes (11,740mGy) COMPLICATIONS: None immediate. TECHNIQUE: Informed written consent was obtained from the patient after a discussion of the risks, benefits and alternatives to treatment. Questions regarding the procedure were encouraged and answered. A timeout was performed prior to the initiation of the procedure. The right upper abdominal quadrant was prepped and draped in the usual sterile fashion, and a sterile drape was applied covering the operative field. Maximum barrier sterile technique with sterile gowns and gloves were used for the procedure. A timeout was performed prior to the initiation of the procedure. Ultrasound scanning of the right upper abdominal quadrant was performed to delineate the anatomy and avoid transgression of the gallbladder or the pleural. A spot along the right mid axillary line was marked fluoroscopically inferior to the right costophrenic angle. Sonographic evaluation was negative for significant dilatation of the intrahepatic biliary tree. Initial attempts were made to opacify a nondilated peripheral duct within the right lobe of the  liver however despite transient opacification, the duct could not be cannulated. As such, under ultrasound guidance, a 22 gauge needle was advanced towards the central aspect of the biliary tree. Contrast was injected as the needle was slowly retracted ultimately opacifying a central intrahepatic biliary duct. The duct was cannulated with a Nitrex wire which was advanced to the level of the CBD. Under fluoroscopic guidance, the access needle was exchanged for the inner 3 French catheter of the [place]ust[place] set. A limited percutaneous cholangiogram was then performed. Next, a now opacified nondilated duct within the posterior aspect the right lobe of the liver was targeted with a second 22 gauge needle allowing advancement of a Nitrex wire through the cannulated peripheral bile duct to the level of the CBD. The track was dilated with the [place]. Next, with the use of a 4 French angled glide catheter, a regular glidewire was advanced through the CBD past the distally obstructing lesion to the level of the duodenum. Contrast injection confirmed appropriate positioning. Over an Amplatz wire, the track was dilated ultimately allowing placement of an 8 French percutaneous biliary drainage catheter. Note, neither a 10 or 12 French percutaneous biliary drainage catheter was available at the time of this initial placement. Contrast was injected as several spot radiographic images were obtained in various obliquities. The biliary drainage catheter was connected to a gravity bag and secured in place with a interrupted suture and a Stat Lock device. The patient tolerated the procedure well without immediate postprocedural complication. FINDINGS: Sonographic evaluation was negative for any significant intrahepatic biliary ductal dilatation, as was demonstrated recent cross-sectional imaging. A central aspect of the right biliary tree was ultimately opacified and cannulated allowing access to a nondilated peripheral duct  within posteroinferior segment of the right lobe of the liver utilizing a 2 stick technique, ultimately allowing placement of an 8 [place] drainage catheter with end coiled locked within the duodenum. Limited percutaneous  cholangiogram demonstrates long segment narrowing/occlusion involving the mid and peripheral aspect of the CBD. IMPRESSION: Successful placement of an 8 French percutaneous biliary drainage catheter with end coiled and locked within the duodenum. PLAN: - Recommend obtaining daily CMP levels until a nadir of the patient's bilirubin level is obtained. - Note, the standard sized 10 French percutaneous biliary drainage catheter was not available at the of this placement (neither was a 50 [place]) and as such, an 8 [place] drainage catheter was placed. As such, this drainage catheter may be upsized at a later date as clinically indicated. - Consideration for definitive internal biliary stent placement may be considered in 4-6 weeks pending the patient's ultimate plan of care. Electronically Signed   By: Sandi Mariscal M.D.   On: 07/25/2019 15:42   IR EXCHANGE BILIARY DRAIN  Result Date: 07/28/2019 INDICATION: 64 year old female with a history of pancreatic carcinoma, biliary obstruction, hyperbilirubinemia. Concern that the prior drain has become occluded. EXAM: EXCHANGE OF EXISTING INTERNAL/EXTERNAL BILIARY DRAIN MEDICATIONS: 2 g Ancef; The antibiotic was administered within an appropriate time frame prior to the initiation of the procedure. ANESTHESIA/SEDATION: Moderate (conscious) sedation was employed during this procedure. A total of Versed 2.0 mg and Fentanyl 50 mcg was administered intravenously. Moderate Sedation Time: 28 minutes. The patient's level of consciousness and vital signs were monitored continuously by radiology nursing throughout the procedure under my direct supervision. FLUOROSCOPY TIME:  Fluoroscopy Time: 0 minutes 36 seconds (29 mGy). COMPLICATIONS: None PROCEDURE: Informed  written consent was obtained from the patient after a thorough discussion of the procedural risks, benefits and alternatives. All questions were addressed. Maximal Sterile Barrier Technique was utilized including caps, mask, sterile gowns, sterile gloves, sterile drape, hand hygiene and skin antiseptic. A timeout was performed prior to the initiation of the procedure. Patient was positioned supine position on the fluoroscopy table. The right upper quadrant was prepped and draped in the usual sterile fashion including the indwelling drain. 1% lidocaine was used for local anesthesia at the skin entry site. Contrast was injected through the indwelling drain, with patent drain into the entry site of the right biliary system. The limb of the drain across the common bile duct appears occluded. Drain was ligated and the suture was ligated. Bentson wire was advanced through the drain into the duodenum and the drain was removed. 77 French straight sheath was advanced into the biliary system with the introducer removed. Images were acquired after gentle contrast injection. This confirmed obstruction remains at the common bile duct. We then used modified Seldinger technique to place a new 12 [place] biliary drain across the obstruction. Loop was formed in the duodenum and final contrast injection confirmed antegrade contrast flow into the duodenum. Drain was sutured in position attached to gravity drainage. Patient tolerated the procedure well and remained hemodynamically stable throughout. No complications were encountered and no significant blood loss IMPRESSION: Status post exchange of internal/external biliary drain with placement of a new 12 French drain. Signed, Dulcy Fanny. Dellia Nims, RPVI [facility] Radiology Electronically Signed   By: Corrie Mckusick D.O.   On: 07/28/2019 17:26   IR IMAGING GUIDED PORT INSERTION  Result Date: 07/28/2019 INDICATION: 64 year old female  with nonfunctional port catheter EXAM: REMOVAL OF NONFUNCTIONAL PORT CATHETER. IMPLANTED PORT A CATH PLACEMENT WITH ULTRASOUND AND FLUOROSCOPIC GUIDANCE MEDICATIONS: 2 G ANCEF; The antibiotic was administered within an appropriate time interval prior to skin puncture. ANESTHESIA/SEDATION: Moderate (conscious) sedation was employed during this procedure. A total of Versed  4.0 mg and Fentanyl 150 mcg was administered intravenously. Moderate Sedation Time: 45 minutes. The patient's level of consciousness and vital signs were monitored continuously by radiology nursing throughout the procedure under my direct supervision. FLUOROSCOPY TIME:  One minutes, 24 seconds (73 mGy) COMPLICATIONS: None PROCEDURE: Informed consent was obtained from the patient following an explanation of the procedure, risks, benefits and alternatives. The patient understands, agrees and consents for the procedure. All questions were addressed. A time out was performed prior to the initiation of the procedure. The right neck and chest was prepped with chlorhexidine, and draped in the usual sterile fashion using maximum barrier technique (cap and mask, sterile gown, sterile gloves, large sterile sheet, hand hygiene and cutaneous antiseptic). Antibiotic prophylaxis was provided with 2.0g Ancef administered IV one hour prior to skin incision. Local anesthesia was attained by infiltration with 1% lidocaine without epinephrine. The patient was positioned in the operation suite on the image intensifier table in the supine position. The right breast was taped to remove redundant tissue and attempt to recreate the position of the breast in an upright position to decrease any redundancy in the port catheter measurement. The previous scar on the right chest was generously infiltrated with 1% lidocaine for local anesthesia. Infiltration of the skin and subcutaneous tissues surrounding the port was performed. Using sharp and blunt dissection, the port  apparatus and subcutaneous catheter were removed in their entirety. The port pocket was then closed with interrupted Vicryl layer within the deep tissues, an additional intermediate layer with 2 horizontal mattress sutures using Vicryl, and then a running subcuticular with 4-0 Monocryl. The skin was sealed with Derma bond. A sterile dressing was placed, including Steri-Strips. We then proceeded with placement of new port catheter. Ultrasound survey was performed with images stored and sent to PACs. Ultrasound demonstrated patency of the right internal jugular vein, and this was documented with an image. Under real-time ultrasound guidance, this vein was accessed with a 21 gauge micropuncture needle and image documentation was performed. A small dermatotomy was made at the access site with an 11 scalpel. A 0.018" wire was advanced into the SVC and used to estimate the length of the internal catheter. The access needle exchanged for a 61F micropuncture vascular sheath. The 0.018" wire was then removed and a 0.035" wire advanced into the IVC. An appropriate location for the subcutaneous reservoir was selected below the clavicle and an incision was made through the skin and underlying soft tissues. The subcutaneous tissues were then dissected using a combination of blunt and sharp surgical technique and a pocket was formed. A single lumen power injectable portacatheter was then tunneled through the subcutaneous tissues from the pocket to the dermatotomy and the port reservoir placed within the subcutaneous pocket. Prolene suture was then used to anchor the port within the port pocket. The venous access site was then serially dilated and a peel away vascular sheath placed over the wire. The wire was removed and the port catheter advanced into position under fluoroscopic guidance. The catheter tip is positioned at the superior cavoatrial junction. This was documented with a spot image. The portacatheter was then tested and  found to flush and aspirate well. The port was flushed with saline followed by 100 units/mL heparinized saline. The pocket was then closed in two layers using first subdermal inverted interrupted absorbable sutures followed by a running subcuticular suture. The epidermis was then sealed with Dermabond. The dermatotomy at the venous access site was also seal with Dermabond. Patient  tolerated the procedure well and remained hemodynamically stable throughout. No complications encountered and no significant blood loss encountered IMPRESSION: Status post removal of nonfunctional right IJ port catheter with placement of a new right IJ port catheter. Catheter ready for use. Signed, Dulcy Fanny. Dellia Nims, RPVI [facility] Interventional Radiology Specialists [facility] Radiology Electronically Signed   By: Corrie Mckusick D.O.   On: 07/28/2019 17:21   IR IMAGING GUIDED PORT INSERTION  Result Date: 07/14/2019 CLINICAL DATA:  Pancreatic carcinoma, needs poor for planned chemotherapy regimen EXAM: TUNNELED PORT CATHETER PLACEMENT WITH ULTRASOUND AND FLUOROSCOPIC GUIDANCE FLUOROSCOPY TIME:  0.6 minute; 52  uGym2 DAP ANESTHESIA/SEDATION: Intravenous Fentanyl 45mg and Versed 21mwere administered as conscious sedation during continuous monitoring of the patient's level of consciousness and physiological / cardiorespiratory status by the radiology RN, with a total moderate sedation time of 21 minutes. TECHNIQUE: The procedure, risks, benefits, and alternatives were explained to the patient. Questions regarding the procedure were encouraged and answered. The patient understands and consents to the procedure. As antibiotic prophylaxis, cefazolin 2 g was ordered pre-procedure and administered intravenously within one hour of incision. Patency of the right IJ vein was confirmed with ultrasound with image documentation. An appropriate skin site was determined. Skin site was marked. Region was prepped using maximum barrier technique  including cap and mask, sterile gown, sterile gloves, large sterile sheet, and Chlorhexidine as cutaneous antisepsis. The region was infiltrated locally with 1% lidocaine. Under real-time ultrasound guidance, the right IJ vein was accessed with a 21 gauge micropuncture needle; the needle tip within the vein was confirmed with ultrasound image documentation. Needle was exchanged over a 018 guidewire for transitional dilator, and vascular measurement was performed. A small incision was made on the right anterior chest wall and a subcutaneous pocket fashioned. The power-injectable port was positioned and its catheter tunneled to the right IJ dermatotomy site. The transitional dilator was exchanged over an Amplatz wire for a peel-away sheath, through which the port catheter, which had been trimmed to the appropriate length, was advanced and positioned under fluoroscopy with its tip at the cavoatrial junction. Spot chest radiograph confirms good catheter position and no pneumothorax. The port was flushed per protocol. The pocket was closed with deep interrupted and subcuticular continuous 3-0 Monocryl sutures. The incisions were covered with Dermabond then covered with a sterile dressing. The patient tolerated the procedure well. COMPLICATIONS: COMPLICATIONS None immediate IMPRESSION: Technically successful right IJ power-injectable port catheter placement. Ready for routine use. Electronically Signed   By: D Lucrezia Europe.D.   On: 07/14/2019 13:39    ASSESSMENT AND PLAN: 1. Metastatic pancreatic adenocarcinoma -CT of the abdomen pelvis on 07/02/2019 showed a 3.4 x 2.2 x 3  cm mass in the head of the pancreas with multiple ill-defined  hypodense liver masses. -CT of the chest on 07/07/2019 showed no evidence of thoracic  metastasis. -Ultrasound guided liver biopsy on 07/07/2019 demonstrated  metastatic pancreatic  adenocarcinoma. 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 Chronicity, now with mild microcytic anemia 4. Hypokalemia-continue to replete IV/p.o. 5. Uterine  fibroids 6. Abdominal pain secondary to #1 7. Nausea 8.Hyperbilirubinemia, due to a malignant common bile duct stricture MRI/MRCP pancreas head mass, malignant common bile duct stricture at the level of the pancreas head dilatation of the proximal common bile duct intrahepatic biliary ductal dilatation leg appearing nonocclusive thrombus at the SMV and vein.  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  Numerous liver metastases 9.  Hypercalcemia-status post Zometa 08/06/2019  Rachel Vasquez is more somnolent this morning with a low-grade fever, tachycardia, and tachypnea. ?  early biliary sepsis.  She has worsening of her LFTs and total bilirubin this morning.  Biliary drain appears to be functioning well.  Hypercalcemia improved.  Appears dehydrated. She has persistent leukocytosis, anemia, and thrombocytopenia.  The patient has a bed offer for SNF placement, but sister concerned about quarantine period.  Conversation with sister regarding worsening clinical status.  Agreeable to DNR/DNI, transition of care consult for hospice and possible [facility].  However, wants to continue treatment including IV fluids and IV antibiotics at this time.  1.  Begin normal saline 100 cc/h. 2.  Begin Zosyn 3.375 g every 8 hours for possible early  biliary sepsis. 3.  Transition of care consult placed for hospice/[facility]. 4.  Sister agrees to DNR/DNI.      LOS: 6 days   Mikey Bussing, NP 08/07/19  Rachel Vasquez was interviewed and examined.  She is lethargic this morning with tachycardia and tachypnea.  The symptoms could be related to the metastatic tumor burden or an infection.  She will begin empiric IV antibiotics. I do not feel she is stable to undergo a CT this morning.  I discussed the poor prognosis with her sister, Rachel Vasquez.  Rachel Vasquez has an incurable malignancy and very poor performance status.  I estimate her life span may be days to a few weeks.  Hospice care and transition to [facility] if she is stable over the next 1-2 days.  I discussed CPR and ACLS issues with Rachel Vasquez and Rachel Vasquez.  Rachel Vasquez is unable to participate in medical decisions this morning.  Her sister understands the poor prognosis and agrees to a no CODE BLUE status.  We will attempt to obtain BRCA testing while Rachel Vasquez is in the hospital.  I will be out of town starting later today.  Dr. Maylon Peppers will see Ms. Lariccia.

## 2019-08-07 NOTE — Progress Notes (Signed)
PT Cancellation Note  Patient Details Name: Rachel Vasquez MRN: [id number] DOB: 1956/03/23   Cancelled Treatment:    Reason Eval/Treat Not Completed: Other (comment)  Spoke with RN.  Pt with RR of 39 and very lethargic.  Per RN and chart review, pt with hospice consult and now DNR.  Will f/u as able/appropriate.  Maggie Font, PT Acute Rehab Services Pager [phone] [facility] Rehab [phone] [facility] [place] 08/07/2019, 1:36 PM

## 2019-08-07 NOTE — Progress Notes (Signed)
Brief oncology note:  I have left a message with the genetic counselors at the [facility] health cancer center to determine how best to send BRCA1 and BRCA2 testing on this patient. Awaiting return call.

## 2019-08-08 LAB — COMPREHENSIVE METABOLIC PANEL
ALT: 104 U/L — ABNORMAL HIGH (ref 0–44)
AST: 96 U/L — ABNORMAL HIGH (ref 15–41)
Albumin: 1.3 g/dL — ABNORMAL LOW (ref 3.5–5.0)
Alkaline Phosphatase: 159 U/L — ABNORMAL HIGH (ref 38–126)
Anion gap: 9 (ref 5–15)
BUN: 45 mg/dL — ABNORMAL HIGH (ref 8–23)
CO2: 21 mmol/L — ABNORMAL LOW (ref 22–32)
Calcium: 9 mg/dL (ref 8.9–10.3)
Chloride: 106 mmol/L (ref 98–111)
Creatinine, Ser: 2.04 mg/dL — ABNORMAL HIGH (ref 0.44–1.00)
GFR calc Af Amer: 29 mL/min — ABNORMAL LOW (ref >60)
GFR calc non Af Amer: 25 mL/min — ABNORMAL LOW (ref >60)
Glucose, Bld: 144 mg/dL — ABNORMAL HIGH (ref 70–99)
Potassium: 4.4 mmol/L (ref 3.5–5.1)
Sodium: 136 mmol/L (ref 135–145)
Total Bilirubin: 10.4 mg/dL — ABNORMAL HIGH (ref 0.3–1.2)
Total Protein: 5.8 g/dL — ABNORMAL LOW (ref 6.5–8.1)

## 2019-08-08 LAB — GLUCOSE, CAPILLARY
Glucose-Capillary: 138 mg/dL — ABNORMAL HIGH (ref 70–99)
Glucose-Capillary: 141 mg/dL — ABNORMAL HIGH (ref 70–99)
Glucose-Capillary: 148 mg/dL — ABNORMAL HIGH (ref 70–99)
Glucose-Capillary: 145 mg/dL — ABNORMAL HIGH (ref 70–99)

## 2019-08-08 LAB — CBC WITH DIFFERENTIAL/PLATELET
Abs Immature Granulocytes: 0.88 10*3/uL — ABNORMAL HIGH (ref 0.00–0.07)
Basophils Absolute: 0.1 10*3/uL (ref 0.0–0.1)
Basophils Relative: 0 %
Eosinophils Absolute: 0.1 10*3/uL (ref 0.0–0.5)
Eosinophils Relative: 1 %
HCT: 29.9 % — ABNORMAL LOW (ref 36.0–46.0)
Hemoglobin: 9.2 g/dL — ABNORMAL LOW (ref 12.0–15.0)
Immature Granulocytes: 4 %
Lymphocytes Relative: 5 %
Lymphs Abs: 1.3 10*3/uL (ref 0.7–4.0)
MCH: 25.3 pg — ABNORMAL LOW (ref 26.0–34.0)
MCHC: 30.8 g/dL (ref 30.0–36.0)
MCV: 82.4 fL (ref 80.0–100.0)
Monocytes Absolute: 0.8 10*3/uL (ref 0.1–1.0)
Monocytes Relative: 3 %
Neutro Abs: 21.9 10*3/uL — ABNORMAL HIGH (ref 1.7–7.7)
Neutrophils Relative %: 87 %
Platelets: 107 10*3/uL — ABNORMAL LOW (ref 150–400)
RBC: 3.63 MIL/uL — ABNORMAL LOW (ref 3.87–5.11)
RDW: 26.1 % — ABNORMAL HIGH (ref 11.5–15.5)
WBC: 25 10*3/uL — ABNORMAL HIGH (ref 4.0–10.5)
nRBC: 0.8 % — ABNORMAL HIGH (ref 0.0–0.2)

## 2019-08-08 MED ORDER — DOCUSATE SODIUM 100 MG PO CAPS: 100.0000 mg | ORAL_CAPSULE | Freq: Two times a day (BID) | ORAL | 0 refills | Status: AC

## 2019-08-08 MED ORDER — ENOXAPARIN SODIUM 40 MG/0.4ML ~~LOC~~ SOLN: 40.0000 mg | SUBCUTANEOUS | Status: DC

## 2019-08-08 MED ORDER — OXYCODONE HCL 5 MG PO TABS: 2.5000 mg | ORAL_TABLET | ORAL | 0 refills | Status: AC | PRN

## 2019-08-08 MED ORDER — LACTULOSE 10 GM/15ML PO SOLN: 10.0000 g | Freq: Two times a day (BID) | ORAL | 0 refills | Status: AC | PRN

## 2019-08-08 NOTE — Progress Notes (Signed)
Brief oncology note:  Blood for BRCA1 and BRCA2 mutation brought to the [facility] lab.  Genetic counselor notified.  Kristin Curcio, DNP, AGPCNP-BC, AOCNP   

## 2019-08-08 NOTE — Discharge Summary (Addendum)
Discharge Summary  Patient ID: Rachel Vasquez MRN: [id number] DOB/AGE: 08/05/1955 64 y.o.  Admit date: 08/01/2019 Discharge date: 08/09/2019  Discharge Diagnoses:  Active Problems:   Pancreatic cancer metastasized to liver [facility])   Hyperglycemia   Discharged Condition: poor  Discharge Labs:   CBC    Component Value Date/Time   WBC 25.0 (H) 08/08/2019 0436   RBC 3.63 (L) 08/08/2019 0436   HGB 9.2 (L) 08/08/2019 0436   HGB 10.5 (L) 08/01/2019 1138   HCT 29.9 (L) 08/08/2019 0436   PLT 107 (L) 08/08/2019 0436   PLT 111 (L) 08/01/2019 1138   MCV 82.4 08/08/2019 0436   MCH 25.3 (L) 08/08/2019 0436   MCHC 30.8 08/08/2019 0436   RDW 26.1 (H) 08/08/2019 0436   LYMPHSABS 1.3 08/08/2019 0436   MONOABS 0.8 08/08/2019 0436   EOSABS 0.1 08/08/2019 0436   BASOSABS 0.1 08/08/2019 0436   CMP Latest Ref Rng & Units 08/08/2019 08/07/2019 08/06/2019  Glucose 70 - 99 mg/dL 144(H) 174(H) 184(H)  BUN 8 - 23 mg/dL 45(H) 29(H) 15  Creatinine 0.44 - 1.00 mg/dL 2.04(H) 1.35(H) 0.43(L)  Sodium 135 - 145 mmol/L 136 135 137  Potassium 3.5 - 5.1 mmol/L 4.4 4.7 3.8  Chloride 98 - 111 mmol/L 106 102 104  CO2 22 - 32 mmol/L 21(L) 23 25  Calcium 8.9 - 10.3 mg/dL 9.0 10.3 10.7(H)  Total Protein 6.5 - 8.1 g/dL 5.8(L) 6.0(L) 5.7(L)  Total Bilirubin 0.3 - 1.2 mg/dL 10.4(H) 11.7(H) 11.1(H)  Alkaline Phos 38 - 126 U/L 159(H) 164(H) 182(H)  AST 15 - 41 U/L 96(H) 79(H) 69(H)  ALT 0 - 44 U/L 104(H) 101(H) 94(H)   Consults: Interventional Radiology  Procedures: None  Disposition:  Discharge disposition: 01-Home or Self Care        Allergies as of 08/09/2019   No Known Allergies     Medication List    STOP taking these medications   amLODipine 10 MG tablet Commonly known as: NORVASC   lidocaine-prilocaine cream Commonly known as: EMLA   loperamide 2 MG capsule Commonly known as: IMODIUM   LORazepam 0.5 MG tablet Commonly known as: ATIVAN   metoCLOPramide 5 MG tablet Commonly known as:  REGLAN   multivitamin with minerals tablet   pantoprazole 40 MG tablet Commonly known as: PROTONIX   polyethylene glycol 17 g packet Commonly known as: MIRALAX / GLYCOLAX   predniSONE 10 MG tablet Commonly known as: DELTASONE   prochlorperazine 10 MG tablet Commonly known as: COMPAZINE     TAKE these medications   docusate sodium 100 MG capsule Commonly known as: COLACE Take 1 capsule (100 mg total) by mouth 2 (two) times daily.   lactulose 10 GM/15ML solution Commonly known as: CHRONULAC Take 15 mLs (10 g total) by mouth 2 (two) times daily as needed for mild constipation.   ondansetron 4 MG disintegrating tablet Commonly known as: ZOFRAN-ODT Take 1 tablet (4 mg total) by mouth every 8 (eight) hours as needed for up to 30 doses for nausea or vomiting.   oxyCODONE 5 MG immediate release tablet Commonly known as: Oxy IR/ROXICODONE Take 0.5-1 tablets (2.5-5 mg total) by mouth every 4 (four) hours as needed for moderate pain. What changed:   how much to take  Another medication with the same name was removed. Continue taking this medication, and follow the directions you see here.         HPI: Rachel Vasquez is a 64 year old female recently diagnosed metastatic pancreatic cancer.  She was  scheduled to begin chemotherapy with gemcitabine and Abraxane on 07/21/2019, but on that date her total bilirubin unexpectedly returned markedly elevated.  She was hospitalized with suspected biliary obstruction. MRI/MRCP showed the pancreatic head mass, a malignant common bile duct stricture at the level of the pancreatic head with prominent dilatation of the proximal common bile duct, mild to moderate diffuse intrahepatic biliary ductal dilatation.  Widespread liver metastases noted.  The porta splenic venous confluence was occluded.  There was bland appearing nonocclusive thrombus of the SMV and of the main portal vein near the bifurcation.  There was near occlusive bland appearing thrombosis of  the right portal vein.  A stent could not be placed during ERCP on 07/23/2018.  She underwent percutaneous biliary drain placement in interventional radiology.  She was discharged home on 07/29/2019.  She was seen at the cancer center on the day of admission for routine follow-up. She was accompanied by her sister who reported having difficulty taking care of her at home.  The patient was extremely weak and unable to walk.  She also had an incident where she had a slide to the floor to avoid fall.  It took 3 people to get her back into the bed.  Oral intake was extremely limited and she was using the bedside commode with assistance.  The patient was noted to be "groggy."  She was admitted secondary to significant failure to thrive and extremely poor performance status.  Hospital Course:  The patient was admitted to the inpatient oncology unit.  Poor prognosis was discussed with the patient and her sister, however, the patient and family want to remain a full code and be considered for SNF placement for rehabilitation.  The patient was given Megace during her hospitalization to try to improve her oral intake, but her oral intake overall continued decline.  Interventional radiology saw the patient during her hospitalization for evaluation of her biliary drain.  The biliary drain remained intact.  There was recommendation to the patient get a CT of the abdomen pelvis for further evaluation of the drain, but the patient's performance status continues to decline and the CT scan was canceled.  The patient developed hypercalcemia during her hospitalization and received a dose of Zometa on 08/06/2019.  The following day, her renal function worsened and this was thought to be due to dehydration.  She was started on normal saline at 100 cc/h.  However, on 08/08/2019, her renal function continued to worsen despite IV fluids.  She had a low-grade fever on 08/07/2019, and was empirically started on Zosyn for questionable early  biliary sepsis.  Due to her continued decline in her performance status, the patient's sister was called on 08/07/2019 with another discussion of goals of care.  It was explained to her sister that her life span may be only days to a few weeks and that it was recommended for her to transition to hospice care and [facility].  The patient sister agreed to DNR/DNI.  They met with hospice and were unsure if they wish to proceed.  However, we spoke with the patient's sister again on 08/08/2019, and she explained that the plan was to proceed with [facility].  Hospice liaison spoke with the patient's sister on 08/08/2019 and they indicated they were "leaning towards" [facility], but asked for placement to be deferred until 08/09/2019 so that her sister to discuss with family in more detail. NP Mikey Bussing spoke with Venia Carbon, [facility], on 08/08/2019 who  indicated that a bed would become available at [facility] for Ms. Nevels and that they would hold this bed for her. We received a confirmation that the bed was available on 08/09/2019, and the patient was discharged to [facility].  Discharge Instructions    Diet - low sodium heart healthy   Complete by: As directed    Increase activity slowly   Complete by: As directed      Signed: Tish Men 08/09/2019, 10:47 AM

## 2019-08-08 NOTE — Progress Notes (Signed)
Manufacturing engineer (ACC)  Spoke with sister [name] and confirmed interest in [employer] [facility].  She is "leaning towards" having her transferred to [facility], but asked that is occur tomorrow so she has time to discuss with family further.  Tentative plans to transfer Saturday am to [facility] pending bed availability.  Venia Carbon RN, BSN, [facility] Liaison (in [place]) [phone]

## 2019-08-08 NOTE — Progress Notes (Signed)
HEMATOLOGY-ONCOLOGY PROGRESS NOTE  SUBJECTIVE: Remains very somnolent this morning.  Able to wake up and states that she wants water.  She points to her stomach when asked if she has pain.  She has not really able to provide any additional review of systems morning.  Oncology History  Pancreatic adenocarcinoma ([place])  07/10/2019 Initial Diagnosis   Pancreatic adenocarcinoma ([place])   07/21/2019 -  Chemotherapy   The patient had PACLitaxel-protein bound (ABRAXANE) chemo infusion 225 mg, 100 mg/m2 = 225 mg (original dose ), Intravenous,  Once, 0 of 4 cycles Dose modification: 100 mg/m2 (Cycle 1, Reason: Provider Judgment) gemcitabine (GEMZAR) 2,280 mg in sodium chloride 0.9 % 250 mL chemo infusion, 1,000 mg/m2 = 2,280 mg, Intravenous,  Once, 0 of 4 cycles  for chemotherapy treatment.     PHYSICAL EXAMINATION:  Vitals:   08/07/19 2205 08/08/19 0548  BP: 114/70 140/87  Pulse: 95 93  Resp: 20 (!) 26  Temp: 97.8 F (36.6 C) 98 F (36.7 C)  SpO2: 94% 99%   Filed Weights   08/01/19 1458  Weight: 241 lb 1.6 oz (109.4 kg)    Intake/Output from previous day: 02/04 0701 - 02/05 0700 In: 2103.8 [I.V.:1953.8; IV Piggyback:150] Out: 350 [Urine:100; Drains:250]  GENERAL: Lethargic, arouses to verbal stimuli. EYES: Scleral icterus  OROPHARYNX: Dry mucus membranes. No thrush or mucositis LUNGS: clear to auscultation and percussion with normal breathing effort HEART: regular rate & rhythm, pitting edema in the lower extremities ABDOMEN:abdomen soft, no hepatomegaly, tender right abdomen. Right abdomen drain site with small amount of drainage. NEURO: lethargic, arouses to verbal stimuli  Port-A-Cath without erythema  LABORATORY DATA:  I have reviewed the data as listed CMP Latest Ref Rng & Units 08/08/2019 08/07/2019 08/06/2019  Glucose 70 - 99 mg/dL 144(H) 174(H) 184(H)  BUN 8 - 23 mg/dL 45(H) 29(H) 15  Creatinine 0.44 - 1.00 mg/dL 2.04(H) 1.35(H) 0.43(L)  Sodium 135 - 145 mmol/L 136 135 137   Potassium 3.5 - 5.1 mmol/L 4.4 4.7 3.8  Chloride 98 - 111 mmol/L 106 102 104  CO2 22 - 32 mmol/L 21(L) 23 25  Calcium 8.9 - 10.3 mg/dL 9.0 10.3 10.7(H)  Total Protein 6.5 - 8.1 g/dL 5.8(L) 6.0(L) 5.7(L)  Total Bilirubin 0.3 - 1.2 mg/dL 10.4(H) 11.7(H) 11.1(H)  Alkaline Phos 38 - 126 U/L 159(H) 164(H) 182(H)  AST 15 - 41 U/L 96(H) 79(H) 69(H)  ALT 0 - 44 U/L 104(H) 101(H) 94(H)    Lab Results  Component Value Date   WBC 25.0 (H) 08/08/2019   HGB 9.2 (L) 08/08/2019   HCT 29.9 (L) 08/08/2019   MCV 82.4 08/08/2019   PLT 107 (L) 08/08/2019   NEUTROABS 21.9 (H) 08/08/2019    X-ray chest PA and lateral  Result Date: 08/01/2019 CLINICAL DATA:  Shortness of breath. EXAM: CHEST - 2 VIEW COMPARISON:  None. FINDINGS: A right Port-A-Cath terminates in the central SVC. Bibasilar opacities have platelike components. No pneumothorax. The cardiomediastinal silhouette is stable. No other acute abnormalities. IMPRESSION: Bibasilar platelike opacities likely represent atelectasis. Underlying infiltrates not excluded. Electronically Signed   By: Dorise Bullion III M.D   On: 08/01/2019 19:20   CT ABDOMEN PELVIS W CONTRAST  Result Date: 07/21/2019 CLINICAL DATA:  History of recently diagnosed metastatic pancreatic cancer. EXAM: CT ABDOMEN AND PELVIS WITH CONTRAST TECHNIQUE: Multidetector CT imaging of the abdomen and pelvis was performed using the standard protocol following bolus administration of intravenous contrast. CONTRAST:  121m OMNIPAQUE IOHEXOL 300 MG/ML  SOLN COMPARISON:  July 02, 2019 FINDINGS: Lower chest: Mild atelectasis is seen within the anterior aspect of the bilateral lung bases. Hepatobiliary: Numerous heterogeneous low-attenuation liver lesions are seen. These are mildly increased in size when compared to the prior study. Ill-defined gallstones are seen within a contracted gallbladder. Pancreas: A 5.6 cm x 6.3 cm x 5.4 cm complex, partially cystic mass is seen involving the body and  head of the pancreas Spleen: Normal in size without focal abnormality. Adrenals/Urinary Tract: Adrenal glands are unremarkable. Kidneys are normal, without renal calculi, focal lesion, or hydronephrosis. Bladder is unremarkable. Stomach/Bowel: There is a large gastric hernia. Appendix appears normal. No evidence of bowel wall thickening, distention, or inflammatory changes. Vascular/Lymphatic: Reproductive: A 12.8 cm x 8.6 cm heterogeneous mass is seen extending from the anterolateral aspect of the uterine fundus on the left. This contains soft tissue and fat density components. Additional 6.8 cm x 6.9 cm, 5.0 cm diameter and 3.7 cm diameter homogeneous uterine mass is seen. Other: Small fat containing ventral hernias are seen along the midline of the lower abdomen. Musculoskeletal: No acute or significant osseous findings. IMPRESSION: 1. 5.6 cm x 6.3 cm x 5.4 cm complex pancreatic mass which is increased in size when compared to the prior study dated July 02, 2019. 2. Numerous heterogeneous liver lesions consistent with hepatic metastasis. 3. Cholelithiasis. 4. Large gastric hernia. 5. Multiple large uterine masses which may represent large uterine fibroids. Electronically Signed   By: Virgina Norfolk M.D.   On: 07/21/2019 21:34   MR 3D Recon At Scanner  Result Date: 07/23/2019 CLINICAL DATA:  Inpatient. Pancreatic cancer with biopsy-proven liver metastases based on 07/07/2019 liver mass biopsy, now with hyperbilirubinemia. Chemotherapy initiated 07/21/2019. EXAM: MRI ABDOMEN WITHOUT AND WITH CONTRAST (INCLUDING MRCP) TECHNIQUE: Multiplanar multisequence MR imaging of the abdomen was performed both before and after the administration of intravenous contrast. Heavily T2-weighted images of the biliary and pancreatic ducts were obtained, and three-dimensional MRCP images were rendered by post processing. CONTRAST:  40m GADAVIST GADOBUTROL 1 MMOL/ML IV SOLN COMPARISON:  07/21/2019 CT abdomen/pelvis.  FINDINGS: Lower chest: No acute abnormality at the lung bases. Hepatobiliary: There are numerous (greater than 15) similar liver masses scattered throughout the liver each demonstrating mild T2 hyperintensity, restricted diffusion and heterogeneous enhancement, compatible with liver metastases. Representative liver masses measure 5.1 x 3.8 cm in the segment 2 left liver lobe (series 3/image 19), 5.3 x 4.3 cm in the segment 8 right liver lobe (series 3/image 17) and 1.8 x 1.6 cm in the caudate lobe (series 3/image 21). Additional scattered simple liver cysts, largest 2.3 cm in the posterior right liver lobe. No hepatic steatosis. Nondistended gallbladder contains numerous gallstones, largest 10 mm. No definite gallbladder wall thickening. No significant pericholecystic fluid. Mild-to-moderate diffuse intrahepatic biliary ductal dilatation. Prominent proximal common bile duct dilation up to 18 mm diameter, with abrupt CBD caliber transition at the level of the pancreatic head. No choledocholithiasis. No beading of the intrahepatic bile ducts. Pancreas: Poorly marginated pancreatic head mass measuring approximately 5.3 x 4.9 cm (series 3/image 39) with mixed T1 and T2 signal intensity and thick irregular peripheral enhancement with central necrosis. Dilated main pancreatic duct up to 6 mm diameter. Spleen: Normal size. No mass. Adrenals/Urinary Tract: Normal adrenals. No hydronephrosis. Normal kidneys with no renal mass. Stomach/Bowel: Moderate hiatal hernia. Otherwise normal nondistended stomach. Visualized small and large bowel is normal caliber, with no bowel wall thickening. Vascular/Lymphatic: Normal caliber abdominal aorta. Occluded portal splenic venous confluence with nonocclusive bland appearing thrombosis  of the SMV (series 1103/image 60). Nonocclusive bland appearing thrombosis of main portal vein near the bifurcation with near occlusive thrombosis of the right portal vein. Patent left portal vein. Patent  renal and hepatic veins and IVC. No pathologically enlarged lymph nodes in the abdomen. Other: No abdominal ascites or focal fluid collection. Musculoskeletal: No aggressive appearing focal osseous lesions. IMPRESSION: 1. Poorly marginated heterogeneously enhancing 5.3 cm pancreatic head mass compatible with known primary pancreatic malignancy. 2. 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 Mild-to-moderate diffuse intrahepatic biliary ductal dilatation. 3. 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 Near-occlusive bland appearing thrombosis of the right portal vein. 4. Widespread liver metastases. 5. Moderate hiatal hernia. Electronically Signed   By: Ilona Sorrel M.D.   On: 07/23/2019 14:09   IR REMOVAL TUN ACCESS W/ PORT W/O FL MOD SED  Result Date: 07/28/2019 INDICATION: 64 year old female with nonfunctional port catheter EXAM: REMOVAL OF NONFUNCTIONAL PORT CATHETER. IMPLANTED PORT A CATH PLACEMENT WITH ULTRASOUND AND FLUOROSCOPIC GUIDANCE MEDICATIONS: 2 G ANCEF; The antibiotic was administered within an appropriate time interval prior to skin puncture. ANESTHESIA/SEDATION: Moderate (conscious) sedation was employed during this procedure. A total of Versed 4.0 mg and Fentanyl 150 mcg was administered intravenously. Moderate Sedation Time: 45 minutes. The patient's level of consciousness and vital signs were monitored continuously by radiology nursing throughout the procedure under my direct supervision. FLUOROSCOPY TIME:  One minutes, 24 seconds (73 mGy) COMPLICATIONS: None PROCEDURE: Informed consent was obtained from the patient following an explanation of the procedure, risks, benefits and alternatives. The patient understands, agrees and consents for the procedure. All questions were addressed. A time out was performed prior to  the initiation of the procedure. The right neck and chest was prepped with chlorhexidine, and draped in the usual sterile fashion using maximum barrier technique (cap and mask, sterile gown, sterile gloves, large sterile sheet, hand hygiene and cutaneous antiseptic). Antibiotic prophylaxis was provided with 2.0g Ancef administered IV one hour prior to skin incision. Local anesthesia was attained by infiltration with 1% lidocaine without epinephrine. The patient was positioned in the operation suite on the image intensifier table in the supine position. The right breast was taped to remove redundant tissue and attempt to recreate the position of the breast in an upright position to decrease any redundancy in the port catheter measurement. The previous scar on the right chest was generously infiltrated with 1% lidocaine for local anesthesia. Infiltration of the skin and subcutaneous tissues surrounding the port was performed. Using sharp and blunt dissection, the port apparatus and subcutaneous catheter were removed in their entirety. The port pocket was then closed with interrupted Vicryl layer within the deep tissues, an additional intermediate layer with 2 horizontal mattress sutures using Vicryl, and then a running subcuticular with 4-0 Monocryl. The skin was sealed with Derma bond. A sterile dressing was placed, including Steri-Strips. We then proceeded with placement of new port catheter. Ultrasound survey was performed with images stored and sent to PACs. Ultrasound demonstrated patency of the right internal jugular vein, and this was documented with an image. Under real-time ultrasound guidance, this vein was accessed with a 21 gauge micropuncture needle and image documentation was performed. A small dermatotomy was made at the access site with an 11 scalpel. A 0.018" wire was advanced into the SVC and used to estimate the length of the  internal catheter. The access needle exchanged for a 3F micropuncture  vascular sheath. The 0.018" wire was then removed and a 0.035" wire advanced into the IVC. An appropriate location for the subcutaneous reservoir was selected below the clavicle and an incision was made through the skin and underlying soft tissues. The subcutaneous tissues were then dissected using a combination of blunt and sharp surgical technique and a pocket was formed. A single lumen power injectable portacatheter was then tunneled through the subcutaneous tissues from the pocket to the dermatotomy and the port reservoir placed within the subcutaneous pocket. Prolene suture was then used to anchor the port within the port pocket. The venous access site was then serially dilated and a peel away vascular sheath placed over the wire. The wire was removed and the port catheter advanced into position under fluoroscopic guidance. The catheter tip is positioned at the superior cavoatrial junction. This was documented with a spot image. The portacatheter was then tested and found to flush and aspirate well. The port was flushed with saline followed by 100 units/mL heparinized saline. The pocket was then closed in two layers using first subdermal inverted interrupted absorbable sutures followed by a running subcuticular suture. The epidermis was then sealed with Dermabond. The dermatotomy at the venous access site was also seal with Dermabond. Patient tolerated the procedure well and remained hemodynamically stable throughout. No complications encountered and no significant blood loss encountered IMPRESSION: Status post removal of nonfunctional right IJ port catheter with placement of a new right IJ port catheter. Catheter ready for use. Signed, Dulcy Fanny. Dellia Nims, RPVI [facility] Radiology Electronically Signed   By: Corrie Mckusick D.O.   On: 07/28/2019 17:21   DG CHEST PORT 1 VIEW  Result Date: 07/27/2019 CLINICAL DATA:  Port-A-Cath placement. EXAM: PORTABLE CHEST  1 VIEW COMPARISON:  None. FINDINGS: A right-sided venous Port-A-Cath is seen with its distal tip overlying the right apex. This is within the region overlying the proximal portion of the right clavicle. Mild linear atelectasis is seen within the right lung base. There is a small left pleural effusion. No pneumothorax is identified. The cardiac silhouette is moderately enlarged. The visualized skeletal structures are unremarkable. IMPRESSION: 1. Right-sided venous Port-A-Cath positioning, as described above. 2. Mild right basilar atelectasis. 3. Small left pleural effusion. Electronically Signed   By: Virgina Norfolk M.D.   On: 07/27/2019 22:43   DG Abd Portable 1V  Result Date: 07/10/2019 CLINICAL DATA:  Abdominal pain EXAM: PORTABLE ABDOMEN - 1 VIEW COMPARISON:  07/08/2019 FINDINGS: Bowel gas pattern remains unremarkable. Stool burden is overall mild with greater burden within the ascending colon. Tubal ligation clips noted. IMPRESSION: Normal bowel gas pattern. Electronically Signed   By: Macy Mis M.D.   On: 07/10/2019 13:20   DG C-Arm 1-60 Min-No Report  Result Date: 07/24/2019 Fluoroscopy was utilized by the requesting physician.  No radiographic interpretation.   MR ABDOMEN MRCP W WO CONTAST  Result Date: 07/23/2019 CLINICAL DATA:  Inpatient. Pancreatic cancer with biopsy-proven liver metastases based on 07/07/2019 liver mass biopsy, now with hyperbilirubinemia. Chemotherapy initiated 07/21/2019. EXAM: MRI ABDOMEN WITHOUT AND WITH CONTRAST (INCLUDING MRCP) TECHNIQUE: Multiplanar multisequence MR imaging of the abdomen was performed both before and after the administration of intravenous contrast. Heavily T2-weighted images of the biliary and pancreatic ducts were obtained, and three-dimensional MRCP images were rendered by post processing. CONTRAST:  25m GADAVIST GADOBUTROL 1 MMOL/ML IV SOLN COMPARISON:  07/21/2019 CT abdomen/pelvis. FINDINGS: Lower chest: No  acute abnormality at the lung  bases. Hepatobiliary: There are numerous (greater than 15) similar liver masses scattered throughout the liver each demonstrating mild T2 hyperintensity, restricted diffusion and heterogeneous enhancement, compatible with liver metastases. Representative liver masses measure 5.1 x 3.8 cm in the segment 2 left liver lobe (series 3/image 19), 5.3 x 4.3 cm in the segment 8 right liver lobe (series 3/image 17) and 1.8 x 1.6 cm in the caudate lobe (series 3/image 21). Additional scattered simple liver cysts, largest 2.3 cm in the posterior right liver lobe. No hepatic steatosis. Nondistended gallbladder contains numerous gallstones, largest 10 mm. No definite gallbladder wall thickening. No significant pericholecystic fluid. Mild-to-moderate diffuse intrahepatic biliary ductal dilatation. Prominent proximal common bile duct dilation up to 18 mm diameter, with abrupt CBD caliber transition at the level of the pancreatic head. No choledocholithiasis. No beading of the intrahepatic bile ducts. Pancreas: Poorly marginated pancreatic head mass measuring approximately 5.3 x 4.9 cm (series 3/image 39) with mixed T1 and T2 signal intensity and thick irregular peripheral enhancement with central necrosis. Dilated main pancreatic duct up to 6 mm diameter. Spleen: Normal size. No mass. Adrenals/Urinary Tract: Normal adrenals. No hydronephrosis. Normal kidneys with no renal mass. Stomach/Bowel: Moderate hiatal hernia. Otherwise normal nondistended stomach. Visualized small and large bowel is normal caliber, with no bowel wall thickening. Vascular/Lymphatic: Normal caliber abdominal aorta. Occluded portal splenic venous confluence with nonocclusive bland appearing thrombosis of the SMV (series 1103/image 60). Nonocclusive bland appearing thrombosis of main portal vein near the bifurcation with near occlusive thrombosis of the right portal vein. Patent left portal vein. Patent renal and hepatic veins and IVC. No pathologically  enlarged lymph nodes in the abdomen. Other: No abdominal ascites or focal fluid collection. Musculoskeletal: No aggressive appearing focal osseous lesions. IMPRESSION: 1. Poorly marginated heterogeneously enhancing 5.3 cm pancreatic head mass compatible with known primary pancreatic malignancy. 2. 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 Mild-to-moderate diffuse intrahepatic biliary ductal dilatation. 3. 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 Near-occlusive bland appearing thrombosis of the right portal vein. 4. Widespread liver metastases. 5. Moderate hiatal hernia. Electronically Signed   By: Ilona Sorrel M.D.   On: 07/23/2019 14:09   IR INT EXT BILIARY DRAIN WITH CHOLANGIOGRAM  Result Date: 07/25/2019 INDICATION: Concern for metastatic pancreatic cancer, now with elevated LFTs and failed attempted endoscopic biliary stent placement. As such, request made for attempted placement of an internal/external biliary drainage catheter in hopes of improving the patient's LFTs so she may be a candidate for chemotherapy. Note, preceding cross-sectional imaging is negative for significant intrahepatic biliary ductal dilatation. EXAM: ULTRASOUND AND FLUOROSCOPIC GUIDED PERCUTANEOUS TRANSHEPATIC CHOLANGIOGRAM AND BILIARY TUBE PLACEMENT COMPARISON:  CT abdomen pelvis-07/21/2019; MRCP-07/23/2019 MEDICATIONS: Zosyn 3.75 g IV; the antibiotic was administered with an appropriate frame prior to initiation of the procedure. CONTRAST:  74m OMNIPAQUE IOHEXOL 300 MG/ML SOLN - administered into the biliary tree ANESTHESIA/SEDATION: Moderate (conscious) sedation was employed during this procedure. A total of Versed 8 mg and Fentanyl 300 mcg was administered intravenously. Moderate Sedation Time: 68 minutes. The patient's level of consciousness and  vital signs were monitored continuously by radiology nursing throughout the procedure under my direct supervision. FLUOROSCOPY TIME:  22 minutes (14,765mGy) COMPLICATIONS: None immediate. TECHNIQUE: Informed written consent was obtained from the patient after a discussion of the risks, benefits and alternatives to treatment.  Questions regarding the procedure were encouraged and answered. A timeout was performed prior to the initiation of the procedure. The right upper abdominal quadrant was prepped and draped in the usual sterile fashion, and a sterile drape was applied covering the operative field. Maximum barrier sterile technique with sterile gowns and gloves were used for the procedure. A timeout was performed prior to the initiation of the procedure. Ultrasound scanning of the right upper abdominal quadrant was performed to delineate the anatomy and avoid transgression of the gallbladder or the pleural. A spot along the right mid axillary line was marked fluoroscopically inferior to the right costophrenic angle. Sonographic evaluation was negative for significant dilatation of the intrahepatic biliary tree. Initial attempts were made to opacify a nondilated peripheral duct within the right lobe of the liver however despite transient opacification, the duct could not be cannulated. As such, under ultrasound guidance, a 22 gauge needle was advanced towards the central aspect of the biliary tree. Contrast was injected as the needle was slowly retracted ultimately opacifying a central intrahepatic biliary duct. The duct was cannulated with a Nitrex wire which was advanced to the level of the CBD. Under fluoroscopic guidance, the access needle was exchanged for the inner 3 French catheter of the [place] set. A limited percutaneous cholangiogram was then performed. Next, a now opacified nondilated duct within the posterior aspect the right lobe of the liver was targeted with a second 22 gauge needle allowing  advancement of a Nitrex wire through the cannulated peripheral bile duct to the level of the CBD. The track was dilated with the [place] set. Next, with the use of a 4 French angled glide catheter, a regular glidewire was advanced through the CBD past the distally obstructing lesion to the level of the duodenum. Contrast injection confirmed appropriate positioning. Over an Amplatz wire, the track was dilated ultimately allowing placement of an 8 French percutaneous biliary drainage catheter. Note, neither a 10 or 12 French percutaneous biliary drainage catheter was available at the time of this initial placement. Contrast was injected as several spot radiographic images were obtained in various obliquities. The biliary drainage catheter was connected to a gravity bag and secured in place with a interrupted suture and a Stat Lock device. The patient tolerated the procedure well without immediate postprocedural complication. FINDINGS: Sonographic evaluation was negative for any significant intrahepatic biliary ductal dilatation, as was demonstrated recent cross-sectional imaging. A central aspect of the right biliary tree was ultimately opacified and cannulated allowing access to a nondilated peripheral duct within posteroinferior segment of the right lobe of the liver utilizing a 2 stick technique, ultimately allowing placement of an 8 [place] biliary drainage catheter with end coiled locked within the duodenum. Limited percutaneous cholangiogram demonstrates long segment narrowing/occlusion involving the mid and peripheral aspect of the CBD. IMPRESSION: Successful placement of an 8 French percutaneous biliary drainage catheter with end coiled and locked within the duodenum. PLAN: - Recommend obtaining daily CMP levels until a nadir of the patient's bilirubin level is obtained. - Note, the standard sized 10 French percutaneous biliary drainage catheter was not available at the of this placement (neither was a 59  [place]) and as such, an 8 [place] drainage catheter was placed. As such, this drainage catheter may be upsized at a later date as clinically indicated. - Consideration for definitive internal biliary stent placement may be considered in 4-6 weeks pending the patient's ultimate plan of care. Electronically Signed   By: Sandi Mariscal M.D.   On:  07/25/2019 15:42   IR EXCHANGE BILIARY DRAIN  Result Date: 07/28/2019 INDICATION: 64 year old female with a history of pancreatic carcinoma, biliary obstruction, hyperbilirubinemia. Concern that the prior drain has become occluded. EXAM: EXCHANGE OF EXISTING INTERNAL/EXTERNAL BILIARY DRAIN MEDICATIONS: 2 g Ancef; The antibiotic was administered within an appropriate time frame prior to the initiation of the procedure. ANESTHESIA/SEDATION: Moderate (conscious) sedation was employed during this procedure. A total of Versed 2.0 mg and Fentanyl 50 mcg was administered intravenously. Moderate Sedation Time: 28 minutes. The patient's level of consciousness and vital signs were monitored continuously by radiology nursing throughout the procedure under my direct supervision. FLUOROSCOPY TIME:  Fluoroscopy Time: 0 minutes 36 seconds (29 mGy). COMPLICATIONS: None PROCEDURE: Informed written consent was obtained from the patient after a thorough discussion of the procedural risks, benefits and alternatives. All questions were addressed. Maximal Sterile Barrier Technique was utilized including caps, mask, sterile gowns, sterile gloves, sterile drape, hand hygiene and skin antiseptic. A timeout was performed prior to the initiation of the procedure. Patient was positioned supine position on the fluoroscopy table. The right upper quadrant was prepped and draped in the usual sterile fashion including the indwelling drain. 1% lidocaine was used for local anesthesia at the skin entry site. Contrast was injected through the indwelling drain, with patent drain into the entry site of the right  biliary system. The limb of the drain across the common bile duct appears occluded. Drain was ligated and the suture was ligated. Bentson wire was advanced through the drain into the duodenum and the drain was removed. 29 French straight sheath was advanced into the biliary system with the introducer removed. Images were acquired after gentle contrast injection. This confirmed obstruction remains at the common bile duct. We then used modified Seldinger technique to place a new 12 [place] biliary drain across the obstruction. Loop was formed in the duodenum and final contrast injection confirmed antegrade contrast flow into the duodenum. Drain was sutured in position attached to gravity drainage. Patient tolerated the procedure well and remained hemodynamically stable throughout. No complications were encountered and no significant blood loss IMPRESSION: Status post exchange of internal/external biliary drain with placement of a new 12 French drain. Signed, Dulcy Fanny. Dellia Nims, RPVI V[facility] Interventional Radiology Specialists [facility] Radiology Electronically Signed   By: Corrie Mckusick D.O.   On: 07/28/2019 17:26   IR IMAGING GUIDED PORT INSERTION  Result Date: 07/28/2019 INDICATION: 64 year old female with nonfunctional port catheter EXAM: REMOVAL OF NONFUNCTIONAL PORT CATHETER. IMPLANTED PORT A CATH PLACEMENT WITH ULTRASOUND AND FLUOROSCOPIC GUIDANCE MEDICATIONS: 2 G ANCEF; The antibiotic was administered within an appropriate time interval prior to skin puncture. ANESTHESIA/SEDATION: Moderate (conscious) sedation was employed during this procedure. A total of Versed 4.0 mg and Fentanyl 150 mcg was administered intravenously. Moderate Sedation Time: 45 minutes. The patient's level of consciousness and vital signs were monitored continuously by radiology nursing throughout the procedure under my direct supervision. FLUOROSCOPY TIME:  One minutes, 24 seconds (73 mGy) COMPLICATIONS: None PROCEDURE:  Informed consent was obtained from the patient following an explanation of the procedure, risks, benefits and alternatives. The patient understands, agrees and consents for the procedure. All questions were addressed. A time out was performed prior to the initiation of the procedure. The right neck and chest was prepped with chlorhexidine, and draped in the usual sterile fashion using maximum barrier technique (cap and mask, sterile gown, sterile gloves, large sterile sheet, hand hygiene and cutaneous antiseptic). Antibiotic prophylaxis was provided with 2.0g Ancef administered  IV one hour prior to skin incision. Local anesthesia was attained by infiltration with 1% lidocaine without epinephrine. The patient was positioned in the operation suite on the image intensifier table in the supine position. The right breast was taped to remove redundant tissue and attempt to recreate the position of the breast in an upright position to decrease any redundancy in the port catheter measurement. The previous scar on the right chest was generously infiltrated with 1% lidocaine for local anesthesia. Infiltration of the skin and subcutaneous tissues surrounding the port was performed. Using sharp and blunt dissection, the port apparatus and subcutaneous catheter were removed in their entirety. The port pocket was then closed with interrupted Vicryl layer within the deep tissues, an additional intermediate layer with 2 horizontal mattress sutures using Vicryl, and then a running subcuticular with 4-0 Monocryl. The skin was sealed with Derma bond. A sterile dressing was placed, including Steri-Strips. We then proceeded with placement of new port catheter. Ultrasound survey was performed with images stored and sent to PACs. Ultrasound demonstrated patency of the right internal jugular vein, and this was documented with an image. Under real-time ultrasound guidance, this vein was accessed with a 21 gauge micropuncture needle and  image documentation was performed. A small dermatotomy was made at the access site with an 11 scalpel. A 0.018" wire was advanced into the SVC and used to estimate the length of the internal catheter. The access needle exchanged for a 64F micropuncture vascular sheath. The 0.018" wire was then removed and a 0.035" wire advanced into the IVC. An appropriate location for the subcutaneous reservoir was selected below the clavicle and an incision was made through the skin and underlying soft tissues. The subcutaneous tissues were then dissected using a combination of blunt and sharp surgical technique and a pocket was formed. A single lumen power injectable portacatheter was then tunneled through the subcutaneous tissues from the pocket to the dermatotomy and the port reservoir placed within the subcutaneous pocket. Prolene suture was then used to anchor the port within the port pocket. The venous access site was then serially dilated and a peel away vascular sheath placed over the wire. The wire was removed and the port catheter advanced into position under fluoroscopic guidance. The catheter tip is positioned at the superior cavoatrial junction. This was documented with a spot image. The portacatheter was then tested and found to flush and aspirate well. The port was flushed with saline followed by 100 units/mL heparinized saline. The pocket was then closed in two layers using first subdermal inverted interrupted absorbable sutures followed by a running subcuticular suture. The epidermis was then sealed with Dermabond. The dermatotomy at the venous access site was also seal with Dermabond. Patient tolerated the procedure well and remained hemodynamically stable throughout. No complications encountered and no significant blood loss encountered IMPRESSION: Status post removal of nonfunctional right IJ port catheter with placement of a new right IJ port catheter. Catheter ready for use. Signed, Dulcy Fanny. Dellia Nims, RPVI  Vascular and Interventional Radiology Specialists [facility] Radiology Electronically Signed   By: Corrie Mckusick D.O.   On: 07/28/2019 17:21   IR IMAGING GUIDED PORT INSERTION  Result Date: 07/14/2019 CLINICAL DATA:  Pancreatic carcinoma, needs poor for planned chemotherapy regimen EXAM: TUNNELED PORT CATHETER PLACEMENT WITH ULTRASOUND AND FLUOROSCOPIC GUIDANCE FLUOROSCOPY TIME:  0.6 minute; 52  uGym2 DAP ANESTHESIA/SEDATION: Intravenous Fentanyl 73mg and Versed 273mwere administered as conscious sedation during continuous monitoring of the patient's level of consciousness and physiological /  cardiorespiratory status by the radiology RN, with a total moderate sedation time of 21 minutes. TECHNIQUE: The procedure, risks, benefits, and alternatives were explained to the patient. Questions regarding the procedure were encouraged and answered. The patient understands and consents to the procedure. As antibiotic prophylaxis, cefazolin 2 g was ordered pre-procedure and administered intravenously within one hour of incision. Patency of the right IJ vein was confirmed with ultrasound with image documentation. An appropriate skin site was determined. Skin site was marked. Region was prepped using maximum barrier technique including cap and mask, sterile gown, sterile gloves, large sterile sheet, and Chlorhexidine as cutaneous antisepsis. The region was infiltrated locally with 1% lidocaine. Under real-time ultrasound guidance, the right IJ vein was accessed with a 21 gauge micropuncture needle; the needle tip within the vein was confirmed with ultrasound image documentation. Needle was exchanged over a 018 guidewire for transitional dilator, and vascular measurement was performed. A small incision was made on the right anterior chest wall and a subcutaneous pocket fashioned. The power-injectable port was positioned and its catheter tunneled to the right IJ dermatotomy site. The transitional dilator was exchanged over an  Amplatz wire for a peel-away sheath, through which the port catheter, which had been trimmed to the appropriate length, was advanced and positioned under fluoroscopy with its tip at the cavoatrial junction. Spot chest radiograph confirms good catheter position and no pneumothorax. The port was flushed per protocol. The pocket was closed with deep interrupted and subcuticular continuous 3-0 Monocryl sutures. The incisions were covered with Dermabond then covered with a sterile dressing. The patient tolerated the procedure well. COMPLICATIONS: COMPLICATIONS None immediate IMPRESSION: Technically successful right IJ power-injectable port catheter placement. Ready for routine use. Electronically Signed   By: Lucrezia Europe M.D.   On: 07/14/2019 13:39    ASSESSMENT AND PLAN: 1. Metastatic pancreatic adenocarcinoma -CT of the abdomen pelvis on 07/02/2019 showed a 3.4 x 2.2 x 3  cm mass in the head of the pancreas with multiple ill-defined  hypodense liver masses. -CT of the chest on 07/07/2019 showed no evidence of thoracic  metastasis. -Ultrasound guided liver biopsy on 07/07/2019 demonstrated  metastatic pancreatic adenocarcinoma. 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 Chronicity, now with mild microcytic anemia 4. Hypokalemia-continue to replete IV/p.o. 5. Uterine fibroids 6. Abdominal pain secondary to #1 7.  Nausea 8.Hyperbilirubinemia, due to a malignant common bile duct stricture MRI/MRCP pancreas head mass, malignant common bile duct stricture at the level of the pancreas head dilatation of the proximal common bile duct intrahepatic biliary ductal dilatation leg appearing nonocclusive thrombus at the SMV and vein.  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  Numerous liver metastases 9.  Hypercalcemia-status post Zometa 08/06/2019  Ms. Cerone continues to be very somnolent.  LFTs and total bilirubin are stable.  Biliary drain appears to be functioning well.  Hypercalcemia improved. She has persistent leukocytosis, anemia, and thrombocytopenia.  Renal function has worsened this morning despite IV fluids.  Remains on IV antibiotics for possible early biliary sepsis.  Seen by hospice last  evening to discuss hospice services and possible [facility].  According to their note, the patient was not ready to make a decision regarding hospice.  She is a DNR/DNI.  1.  Spoke with sister by telephone this morning who states that they would like [facility] placement.  She is awaiting a call from the hospice liaison for further discussion this morning.  We will await the decision from his discussion.  We will plan to transfer Ms. Ishikawa to [facility] as soon as a bed is available once arrangements have been confirmed.  We will DC IV fluids and IV antibiotics as well as any unnecessary medications prior to discharge. 2.  For now, we will continue IV fluids and IV antibiotics.  Plan to DC prior to discharge to hospice/[facility]. 3.  Sister agrees to DNR/DNI. 4.  I continue to have communication with our genetic counselor for possible BRCA1 and BRCA2 testing given the patient's personal history of pancreatic cancer as well as a diagnosis of pancreatic cancer in her father.  We are trying to determine if we can perform this testing while she is inpatient.      LOS: 7 days   Mikey Bussing,  NP 08/08/19

## 2019-08-08 NOTE — Progress Notes (Signed)
NUTRITION NOTE  Patient assessed remotely by RD on Sunday (1/31). Patient is a/o to self and place. No intakes have been documented since admission on 1/29. She has not been weighed since admission.   Hospice is following patient and last talked with patient's sister this AM. Plan is for likely d/c to [facility] tomorrow (2/6) so that sister has an opportunity to talk with family further before making a final decision.   If patient does not d/c to residential hospice/does not d/c over the weekend, RD will complete full follow-up on Monday (2/8).    Jarome Matin, MS, RD, LDN, CNSC Inpatient Clinical Dietitian RD pager # available in [place]  After hours/weekend pager # available in [facility]

## 2019-08-09 LAB — GLUCOSE, CAPILLARY
Glucose-Capillary: 153 mg/dL — ABNORMAL HIGH (ref 70–99)
Glucose-Capillary: 132 mg/dL — ABNORMAL HIGH (ref 70–99)
Glucose-Capillary: 158 mg/dL — ABNORMAL HIGH (ref 70–99)

## 2019-08-09 MED ORDER — HEPARIN SOD (PORK) LOCK FLUSH 100 UNIT/ML IV SOLN
500.0000 [IU] | Freq: Once | INTRAVENOUS | Status: AC
  Administered 2019-08-09: 500 [IU] via INTRAVENOUS
  Filled 2019-08-09: qty 5

## 2019-08-09 NOTE — Progress Notes (Signed)
Manufacturing engineer [facility])  [facility] is ready for Ms. Doland.  RN staff, please call report to [phone] Please fax dc summary to [phone]  Venia Carbon RN, BSN, [place] [facility] Liaison

## 2019-08-09 NOTE — TOC Transition Note (Signed)
Transition of Care [facility]) - CM/SW Discharge Note   Patient Details  Name: Rachel Vasquez MRN: [id number] Date of Birth: May 29, 1956  Transition of Care [facility]) CM/SW Contact:  Ross Ludwig, LCSW Phone Number: 08/09/2019, 11:49 AM   Clinical Narrative:     Patient to be d/c'ed today to [facility].  Patient and family agreeable to plans will transport via ems RN to call report to [phone].  CSW spoke to patient's sister [name], and she would like to be at hospital once EMS arrives.  Final next level of care: [place] Barriers to Discharge: Barriers Resolved   Patient Goals and CMS Choice Patient states their goals for this hospitalization and ongoing recovery are:: To go to [facility] for end of life care. CMS Medicare.gov Compare Post Acute Care list provided to:: Patient Represenative (must comment) Choice offered to / list presented to : Sibling  Discharge Placement              Patient chooses bed at: Other - please specify in the comment section below:([facility] hospice facility) Patient to be transferred to facility by: [facility] Name of family member notified: Patient's sister [name] [phone] Patient and family notified of of transfer: 08/09/19  Discharge Plan and Services   Discharge Planning Services: CM Consult                                 Social Determinants of Health ([place]) Interventions     Readmission Risk Interventions Readmission Risk Prevention Plan 08/04/2019 07/29/2019  Transportation Screening Complete Complete  Medication Review Press photographer) Complete Complete  PCP or Specialist appointment within 3-5 days of discharge Complete Complete  HRI or Home Care Consult Complete Complete  SW Recovery Care/Counseling Consult Complete Complete  Palliative Care Screening Complete Not [place] Complete Not Applicable  Some recent data might be hidden

## 2019-08-22 ENCOUNTER — Telehealth: Payer: Self-pay | Admitting: Emergency Medicine

## 2019-08-22 NOTE — Telephone Encounter (Signed)
Incoming call on [facility] triage line from pt's sister requesting information for how a relative can have a Covid-19 test done.  Provided Rachel Vasquez with phone number for scheduling a test [phone]), denies any further questions or concerns at this time.

## 2019-09-01 DEATH — deceased

## 2019-09-02 ENCOUNTER — Encounter: Payer: Self-pay | Admitting: *Deleted

## 2019-09-02 NOTE — Progress Notes (Signed)
Per Dr. Benay Spice request, mailed copy of genetics testing collected on 08/08/19 to her sister, Donald Siva. Reported out on 08/26/19 [address] [place] [address]

## 2021-12-27 IMAGING — XA IR IMAGING GUIDED PORT INSERTION
1 series · 4 of 4 positions shown · IV contrast (IODINE)
Comparison: none

INDICATION: 63-year-old female with nonfunctional port catheter

[Series 1: dsa check · 4 of 14 frames shown]
[frame 3/14]
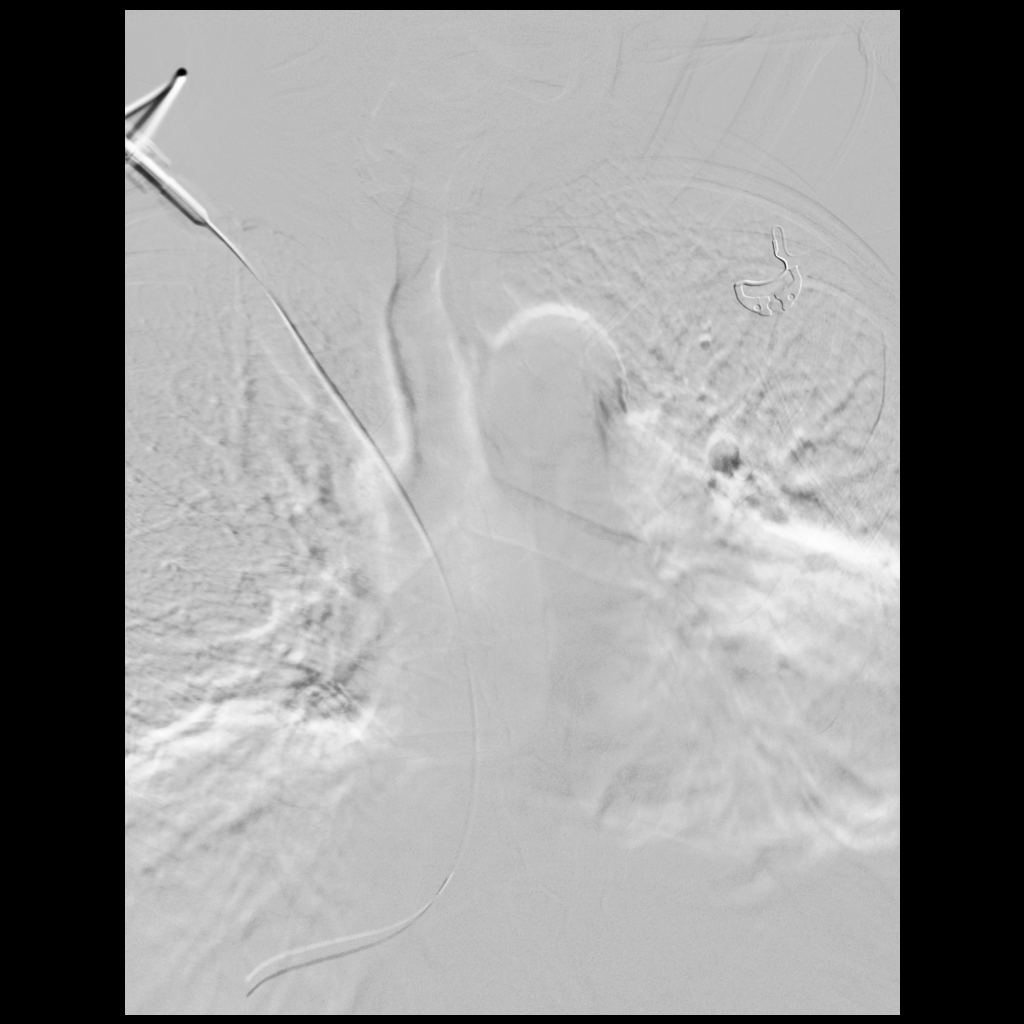
[frame 8/14]
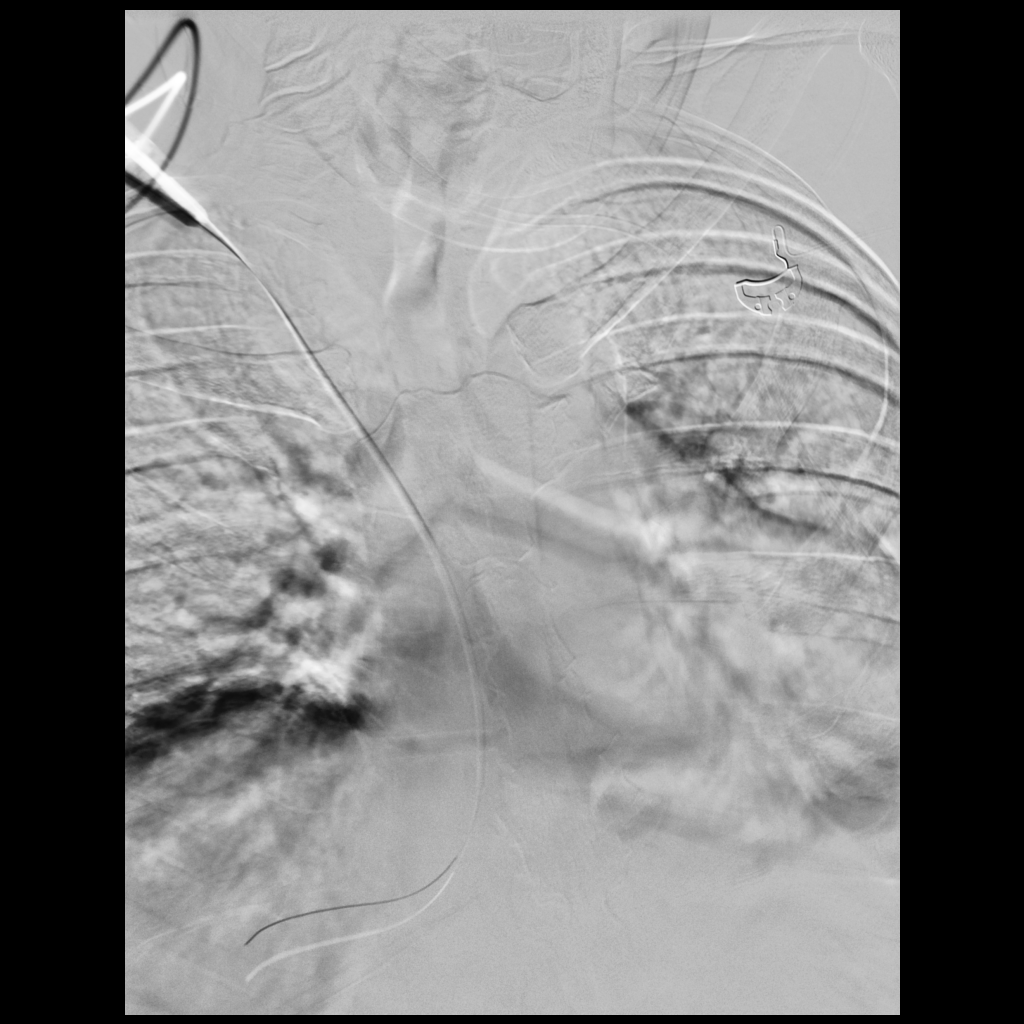
[frame 9/14]
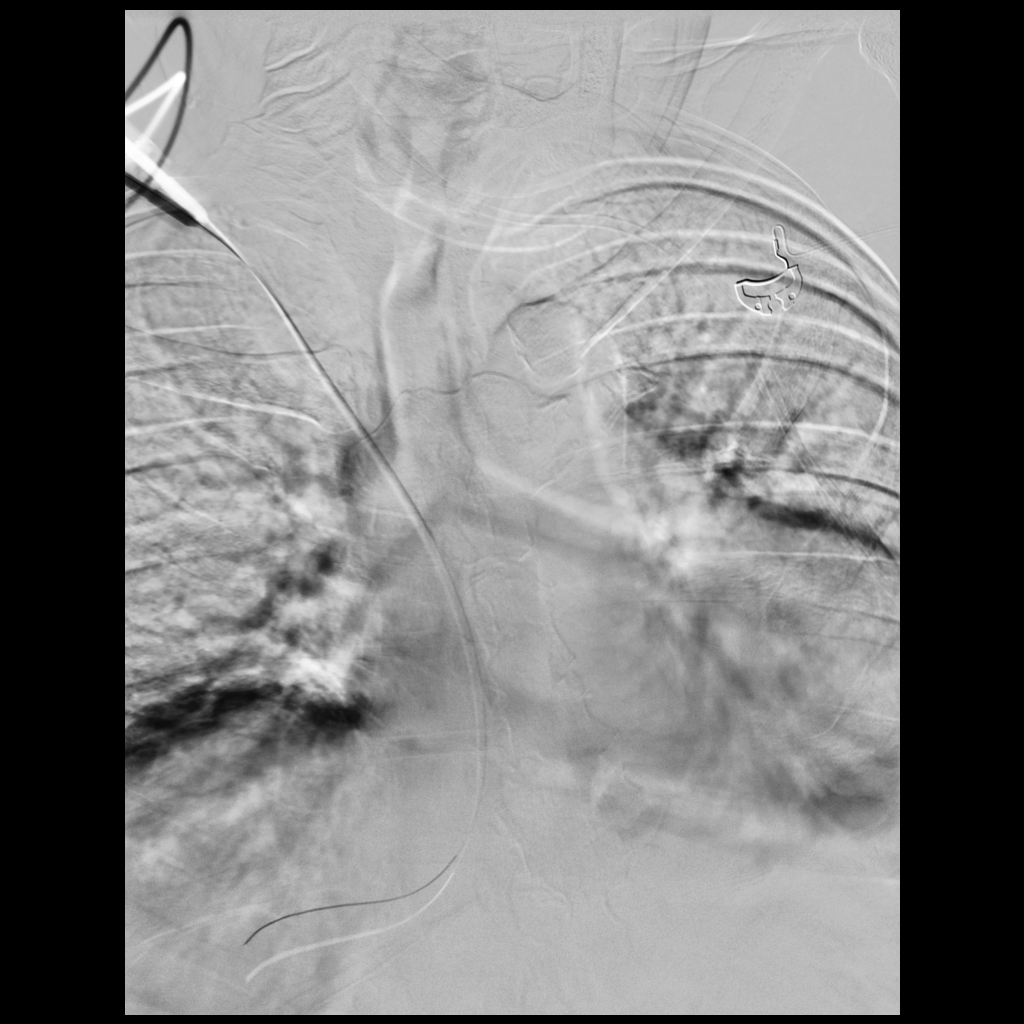
[frame 12/14]
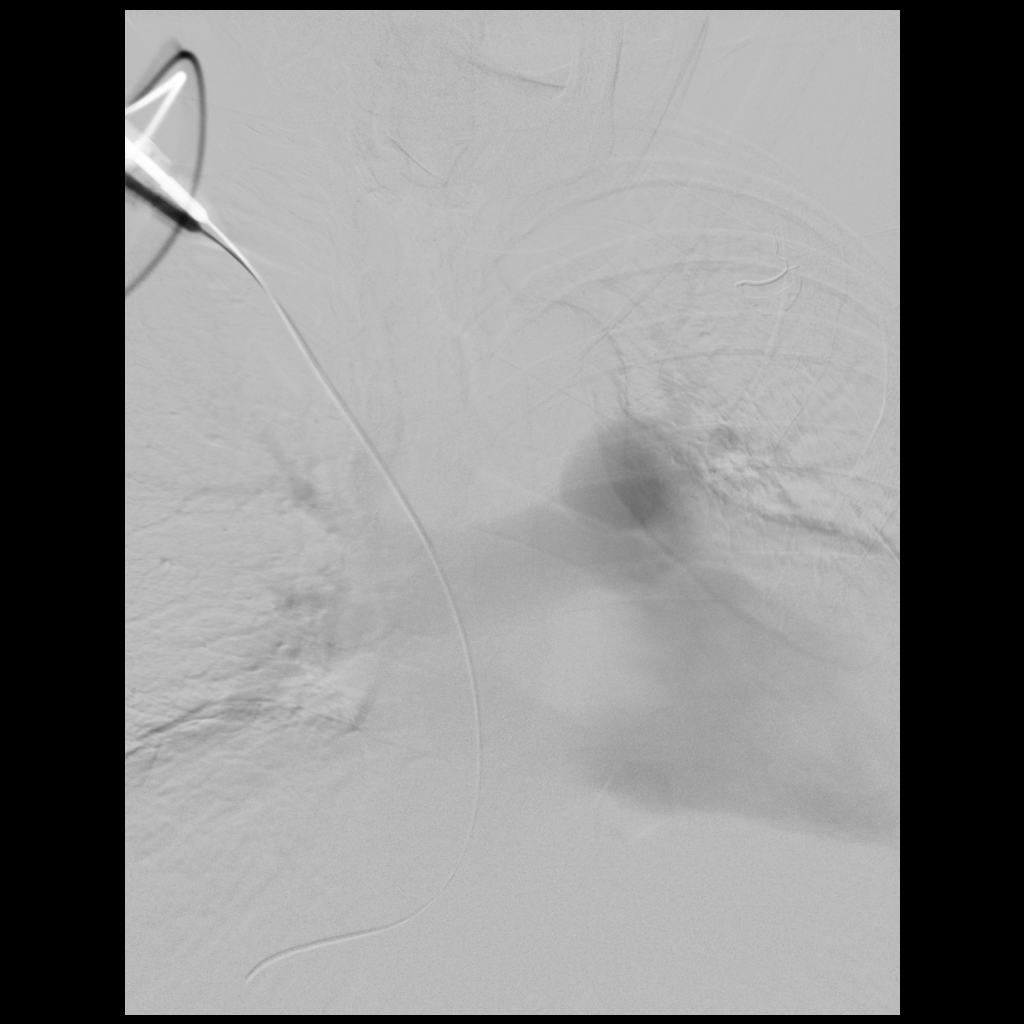

[4 of 4 positions shown; findings below may reference images not displayed]

EXAM:
REMOVAL OF NONFUNCTIONAL PORT CATHETER.

IMPLANTED PORT A CATH PLACEMENT WITH ULTRASOUND AND FLUOROSCOPIC
GUIDANCE

MEDICATIONS:
2 G ANCEF; The antibiotic was administered within an appropriate
time interval prior to skin puncture.

ANESTHESIA/SEDATION:
Moderate (conscious) sedation was employed during this procedure. A
total of Versed 4.0 mg and Fentanyl 150 mcg was administered
intravenously.

Moderate Sedation Time: 45 minutes. The patient's level of
consciousness and vital signs were monitored continuously by
radiology nursing throughout the procedure under my direct
supervision.

FLUOROSCOPY TIME:  One minutes, 24 seconds (73 mGy)

COMPLICATIONS:
None

PROCEDURE:
Informed consent was obtained from the patient following an
explanation of the procedure, risks, benefits and alternatives. The
patient understands, agrees and consents for the procedure. All
questions were addressed. A time out was performed prior to the
initiation of the procedure.

The right neck and chest was prepped with chlorhexidine, and draped
in the usual sterile fashion using maximum barrier technique (cap
and mask, sterile gown, sterile gloves, large sterile sheet, hand
hygiene and cutaneous antiseptic). Antibiotic prophylaxis was
provided with 2.0g Ancef administered IV one hour prior to skin
incision. Local anesthesia was attained by infiltration with 1%
lidocaine without epinephrine.

The patient was positioned in the operation suite on the image
intensifier table in the supine position. The right breast was
taped to remove redundant tissue and attempt to recreate the
position of the breast in an upright position to decrease any
redundancy in the port catheter measurement.

The previous scar on the right chest was generously infiltrated with
1% lidocaine for local anesthesia. Infiltration of the skin and
subcutaneous tissues surrounding the port was performed.

Using sharp and blunt dissection, the port apparatus and
subcutaneous catheter were removed in their entirety.

The port pocket was then closed with interrupted Vicryl layer within
the deep tissues, an additional intermediate layer with 2 horizontal
mattress sutures using Vicryl, and then a running subcuticular with
4-0 Monocryl. The skin was sealed with Derma bond. A sterile
dressing was placed, including Rubin.

We then proceeded with placement of [REDACTED] catheter.

Ultrasound survey was performed with images stored and sent to PACs.

Ultrasound demonstrated patency of the right internal jugular vein,
and this was documented with an image. Under real-time ultrasound
guidance, this vein was accessed with a 21 gauge micropuncture
needle and image documentation was performed. A small dermatotomy
was made at the access site with an 11 scalpel. A 0.018" wire was
advanced into the SVC and used to estimate the length of the
internal catheter. The access needle exchanged for a 4F
micropuncture vascular sheath. The 0.018" wire was then removed and
a 0.035" wire advanced into the IVC.



Prolene suture was then used to anchor the port within the port
pocket.

The venous access site was then serially dilated and a peel away
vascular sheath placed over the wire. The wire was removed and the
port catheter advanced into position under fluoroscopic guidance.
The catheter tip is positioned at the superior cavoatrial junction.
This was documented with a spot image. The portacatheter was then
tested and found to flush and aspirate well. The port was flushed
with saline followed by 100 units/mL heparinized saline.

The pocket was then closed in two layers using first subdermal
inverted interrupted absorbable sutures followed by a running
subcuticular suture. The epidermis was then sealed with Dermabond.
The dermatotomy at the venous access site was also seal with
Dermabond.

Patient tolerated the procedure well and remained hemodynamically
stable throughout.

No complications encountered and no significant blood loss
encountered
IMPRESSION: Status post removal of nonfunctional right IJ port catheter with
placement of a new right IJ port catheter. Catheter ready for use.
